# Patient Record
Sex: Female | Born: 1975
Health system: Southern US, Community
[De-identification: ages and names within clinical notes are randomized; demographics above are authoritative.]

## PROBLEM LIST (undated history)

## (undated) DIAGNOSIS — F1910 Other psychoactive substance abuse, uncomplicated: Secondary | ICD-10-CM

## (undated) DIAGNOSIS — D649 Anemia, unspecified: Secondary | ICD-10-CM

## (undated) DIAGNOSIS — I639 Cerebral infarction, unspecified: Secondary | ICD-10-CM

## (undated) DIAGNOSIS — I10 Essential (primary) hypertension: Secondary | ICD-10-CM

## (undated) DIAGNOSIS — M1990 Unspecified osteoarthritis, unspecified site: Secondary | ICD-10-CM

## (undated) DIAGNOSIS — O1490 Unspecified pre-eclampsia, unspecified trimester: Secondary | ICD-10-CM

## (undated) DIAGNOSIS — E669 Obesity, unspecified: Secondary | ICD-10-CM

## (undated) DIAGNOSIS — F1210 Cannabis abuse, uncomplicated: Secondary | ICD-10-CM

## (undated) DIAGNOSIS — E785 Hyperlipidemia, unspecified: Secondary | ICD-10-CM

## (undated) DIAGNOSIS — K219 Gastro-esophageal reflux disease without esophagitis: Secondary | ICD-10-CM

## (undated) DIAGNOSIS — F1410 Cocaine abuse, uncomplicated: Secondary | ICD-10-CM

## (undated) HISTORY — PX: WRIST SURGERY: SHX841

## (undated) HISTORY — DX: Unspecified pre-eclampsia, unspecified trimester: O14.90

## (undated) HISTORY — DX: Essential (primary) hypertension: I10

## (undated) HISTORY — PX: ABDOMINAL HYSTERECTOMY: SHX81

## (undated) HISTORY — PX: TUBAL LIGATION: SHX77

---

## 2012-09-17 ENCOUNTER — Emergency Department (HOSPITAL_COMMUNITY)
Admission: EM | Admit: 2012-09-17 | Discharge: 2012-09-17 | Disposition: A | Discharge status: Home / Self Care | Payer: Medicaid Other | Attending: Emergency Medicine | Admitting: Emergency Medicine

## 2012-09-17 ENCOUNTER — Encounter (HOSPITAL_COMMUNITY): Payer: Self-pay | Admitting: Nurse Practitioner

## 2012-09-17 DIAGNOSIS — R1310 Dysphagia, unspecified: Secondary | ICD-10-CM | POA: Insufficient documentation

## 2012-09-17 DIAGNOSIS — F17200 Nicotine dependence, unspecified, uncomplicated: Secondary | ICD-10-CM | POA: Insufficient documentation

## 2012-09-17 DIAGNOSIS — J020 Streptococcal pharyngitis: Secondary | ICD-10-CM | POA: Insufficient documentation

## 2012-09-17 DIAGNOSIS — R599 Enlarged lymph nodes, unspecified: Secondary | ICD-10-CM | POA: Insufficient documentation

## 2012-09-17 LAB — RAPID STREP SCREEN (MED CTR MEBANE ONLY): Streptococcus, Group A Screen (Direct): POSITIVE — AB

## 2012-09-17 MED ORDER — AMOXICILLIN 500 MG PO CAPS: 500.0000 mg | ORAL_CAPSULE | Freq: Three times a day (TID) | ORAL | Status: DC

## 2012-09-17 MED ORDER — PREDNISONE 20 MG PO TABS
60.0000 mg | ORAL_TABLET | Freq: Once | ORAL | Status: AC
  Administered 2012-09-17: 60 mg via ORAL
  Filled 2012-09-17: qty 3

## 2012-09-17 NOTE — ED Provider Notes (Signed)
History     CSN: [id number]  Arrival date & time 09/17/12  1335   First MD Initiated Contact with Patient 09/17/12 1421      Chief Complaint  Patient presents with  . Sore Throat    (Consider location/radiation/quality/duration/timing/severity/associated sxs/prior treatment) HPI Comments: 37 y.o. Female presents complaining of sore throat that started Sunday and got progressively worse. Pt states pain is 10/10. Painful to swallow. Denies fever, cough, nausea, vomiting, headache, dizziness, sick contacts, chest pain, shortness of breath.     Patient is a 37 y.o. female presenting with pharyngitis.  Sore Throat Associated symptoms include a sore throat. Pertinent negatives include no abdominal pain, chest pain, diaphoresis, fever, headaches, nausea, neck pain, numbness, rash, vomiting or weakness.    History reviewed. No pertinent past medical history.  Past Surgical History  Procedure Laterality Date  . Abdominal hysterectomy      History reviewed. No pertinent family history.  History  Substance Use Topics  . Smoking status: Current Every Day Smoker  . Smokeless tobacco: Not on file  . Alcohol Use: Yes    OB History   Grav Para Term Preterm Abortions TAB SAB Ect Mult Living                  Review of Systems  Constitutional: Negative for fever and diaphoresis.  HENT: Positive for sore throat and trouble swallowing. Negative for facial swelling, rhinorrhea, neck pain, neck stiffness, dental problem and sinus pressure.        Swollen glands  Eyes: Negative for visual disturbance.  Respiratory: Negative for apnea, chest tightness and shortness of breath.   Cardiovascular: Negative for chest pain and palpitations.  Gastrointestinal: Negative for nausea, vomiting, abdominal pain, diarrhea and constipation.  Genitourinary: Negative for dysuria.  Musculoskeletal: Negative for gait problem.  Skin: Negative for rash.  Neurological: Negative for dizziness, weakness,  light-headedness, numbness and headaches.    Allergies  Review of patient's allergies indicates no known allergies.  Home Medications   Current Outpatient Rx  Name  Route  Sig  Dispense  Refill  . ibuprofen (ADVIL,MOTRIN) 200 MG tablet   Oral   Take 400 mg by mouth every 6 (six) hours as needed for pain.           BP 144/82  Pulse 86  Temp(Src) 98 F (36.7 C) (Oral)  Resp 15  SpO2 98%  Physical Exam  Nursing note and vitals reviewed. Constitutional: She is oriented to person, place, and time. She appears well-developed and well-nourished. No distress.  HENT:  Head: Normocephalic and atraumatic. No trismus in the jaw.  Mouth/Throat: No edematous. Oropharyngeal exudate, posterior oropharyngeal edema and posterior oropharyngeal erythema present. No tonsillar abscesses.  Eyes: Conjunctivae and EOM are normal. Pupils are equal, round, and reactive to light. Right eye exhibits no discharge. Left eye exhibits no discharge.  Neck: Normal range of motion. Neck supple.  No meningeal signs  Cardiovascular: Normal rate, regular rhythm and normal heart sounds.  Exam reveals no gallop and no friction rub.   No murmur heard. Pulmonary/Chest: Effort normal and breath sounds normal. No respiratory distress. She has no wheezes. She has no rales. She exhibits no tenderness.  Abdominal: Soft. Bowel sounds are normal. She exhibits no distension. There is no tenderness. There is no rebound and no guarding.  Musculoskeletal: Normal range of motion. She exhibits no edema and no tenderness.  Lymphadenopathy:    She has cervical adenopathy.  Neurological: She is alert and oriented to person,  place, and time. No cranial nerve deficit.  Skin: Skin is warm and dry. She is not diaphoretic. No erythema.    ED Course  Procedures (including critical care time)  Labs Reviewed  RAPID STREP SCREEN - Abnormal; Notable for the following:    Streptococcus, Group A Screen (Direct) POSITIVE (*)    All  other components within normal limits   No results found.   Diagnosis: strep throat    MDM  Pt afebrile with tonsillar exudate, cervical lymphadenopathy, & dysphagia; Rapid strep positive. Treated in the ED with steroids. Presentation non concerning for PTA or infxn spread to soft tissue. No trismus or uvula deviation. Specific return precautions discussed. Pt able to drink water in ED without difficulty with intact air way. Will prescribe amoxicillin and recommended PCP follow up as needed.    Glade Nurse, PA-C 09/17/12 1626

## 2012-09-17 NOTE — ED Provider Notes (Signed)
Medical screening examination/treatment/procedure(s) were performed by non-physician practitioner and as supervising physician I was immediately available for consultation/collaboration.   Carleene Cooper III, MD 09/17/12 [phone]

## 2012-09-17 NOTE — ED Notes (Signed)
C/o sore throat since sunday 

## 2013-07-21 ENCOUNTER — Encounter (HOSPITAL_COMMUNITY): Payer: Self-pay | Admitting: Emergency Medicine

## 2013-07-21 ENCOUNTER — Emergency Department (HOSPITAL_COMMUNITY)
Admission: EM | Admit: 2013-07-21 | Discharge: 2013-07-21 | Disposition: A | Discharge status: Home / Self Care | Payer: Medicaid Other | Attending: Emergency Medicine | Admitting: Emergency Medicine

## 2013-07-21 DIAGNOSIS — E669 Obesity, unspecified: Secondary | ICD-10-CM | POA: Insufficient documentation

## 2013-07-21 DIAGNOSIS — M79609 Pain in unspecified limb: Secondary | ICD-10-CM

## 2013-07-21 DIAGNOSIS — F17200 Nicotine dependence, unspecified, uncomplicated: Secondary | ICD-10-CM | POA: Insufficient documentation

## 2013-07-21 DIAGNOSIS — I809 Phlebitis and thrombophlebitis of unspecified site: Secondary | ICD-10-CM

## 2013-07-21 MED ORDER — OXYCODONE-ACETAMINOPHEN 5-325 MG PO TABS: 1.0000 | ORAL_TABLET | Freq: Four times a day (QID) | ORAL | Status: DC | PRN

## 2013-07-21 NOTE — ED Provider Notes (Addendum)
CSN: [id number]     Arrival date & time 07/21/13  1053 History   First MD Initiated Contact with Patient 07/21/13 1424     Chief Complaint  Patient presents with  . Leg Pain   (Consider location/radiation/quality/duration/timing/severity/associated sxs/prior Treatment) Patient is a 38 y.o. female presenting with leg pain. The history is provided by the patient.  Leg Pain Associated symptoms: no back pain    patient has had pain in her right calf. She states that she has chronic cramps in her legs. She's been on Flexeril for it but has been off it recently. She states she did have a large cramp in her leg and since then she's had some pain there. It is worse with walking. No other trauma. No chest pain or trouble breathing. She is a smoker. She states family members have had blood clots. No rash. No fevers.  No past medical history on file. Past Surgical History  Procedure Laterality Date  . Abdominal hysterectomy     No family history on file. History  Substance Use Topics  . Smoking status: Current Every Day Smoker -- 0.50 packs/day    Types: Cigarettes  . Smokeless tobacco: Not on file  . Alcohol Use: No   OB History   Grav Para Term Preterm Abortions TAB SAB Ect Mult Living                 Review of Systems  Constitutional: Negative for activity change and appetite change.  Eyes: Negative for pain.  Respiratory: Negative for chest tightness and shortness of breath.   Cardiovascular: Negative for chest pain and leg swelling.  Gastrointestinal: Negative for nausea, vomiting, abdominal pain and diarrhea.  Genitourinary: Negative for flank pain.  Musculoskeletal: Negative for back pain and neck stiffness.  Skin: Negative for rash.  Neurological: Negative for weakness, numbness and headaches.  Psychiatric/Behavioral: Negative for behavioral problems.    Allergies  Review of patient's allergies indicates no known allergies.  Home Medications   Current Outpatient Rx   Name  Route  Sig  Dispense  Refill  . OVER THE COUNTER MEDICATION   Oral   Take 2 tablets by mouth every 4 (four) hours as needed (pain). Off brand Tylenol         . oxyCODONE-acetaminophen (PERCOCET/ROXICET) 5-325 MG per tablet   Oral   Take 1-2 tablets by mouth every 6 (six) hours as needed for severe pain.   10 tablet   0    BP 138/92  Pulse 65  Temp(Src) 98.1 F (36.7 C) (Oral)  Resp 20  SpO2 100% Physical Exam  Constitutional: She is oriented to person, place, and time. She appears well-developed.  Patient is obese  HENT:  Head: Normocephalic.  Eyes: Pupils are equal, round, and reactive to light.  Cardiovascular: Normal rate and regular rhythm.   Pulmonary/Chest: Effort normal and breath sounds normal.  Abdominal: Soft.  Musculoskeletal:  Mild bilateral lower extremity pitting edema. There is tenderness to the right calf medially. No rash. Neurovascular intact over her foot.  Neurological: She is alert and oriented to person, place, and time.  Skin: Skin is warm.    ED Course  Procedures (including critical care time) Labs Review Labs Reviewed - No data to display Imaging Review No results found.  EKG Interpretation   None       MDM   1. Phlebitis    Patient with pain in her right calf. Likely due to muscle cramp, however with patient smoking and  family history we will get Dopplers.    Jasper Riling. Alvino Chapel, MD 07/21/13 1552  Doppler shows thickening without clot. Will discharge home.  Jasper Riling. Alvino Chapel, MD 07/21/13 1606

## 2013-07-21 NOTE — ED Notes (Signed)
Transported to vascular lab

## 2013-07-21 NOTE — Progress Notes (Signed)
VASCULAR LAB PRELIMINARY  PRELIMINARY  PRELIMINARY  PRELIMINARY  Right lower extremity venous duplex completed.    Preliminary report:  Right:  No evidence of DVT, superficial thrombosis, or Baker's cyst.  Thickened walls noted in the GSV in the mid calf at the area of most pain.    Keleigh Kazee, RVT 07/21/2013, 3:47 PM

## 2013-07-21 NOTE — Discharge Instructions (Signed)

## 2013-07-21 NOTE — ED Notes (Signed)
Denies injury. No edema. No c/o SOB. States pain worse with ambulating

## 2013-07-21 NOTE — ED Notes (Signed)
Pt reports for the last week and a half her right calf has been hurting. Pt walking with a limp. Pt alert, VSS.

## 2013-10-21 ENCOUNTER — Encounter (HOSPITAL_COMMUNITY): Payer: Self-pay | Admitting: Emergency Medicine

## 2013-10-21 ENCOUNTER — Emergency Department (HOSPITAL_COMMUNITY)
Admission: EM | Admit: 2013-10-21 | Discharge: 2013-10-21 | Discharge status: Against Medical Advice | Payer: Medicaid Other | Attending: Emergency Medicine | Admitting: Emergency Medicine

## 2013-10-21 DIAGNOSIS — F17200 Nicotine dependence, unspecified, uncomplicated: Secondary | ICD-10-CM | POA: Insufficient documentation

## 2013-10-21 DIAGNOSIS — N949 Unspecified condition associated with female genital organs and menstrual cycle: Secondary | ICD-10-CM | POA: Insufficient documentation

## 2013-10-21 DIAGNOSIS — Z90710 Acquired absence of both cervix and uterus: Secondary | ICD-10-CM | POA: Insufficient documentation

## 2013-10-21 NOTE — ED Notes (Signed)
Called pt x 1 in lobby with no answer 

## 2013-10-21 NOTE — ED Notes (Signed)
Called pt multiple times, not in lobby

## 2013-10-21 NOTE — ED Notes (Signed)
Pt reports hx of hysterectomy in 2013, now having pelvic pain/lower abd pain and pressure. Has the discomfort when urinating. Denies any n/v/d. No acute distress noted at triage.

## 2013-11-04 ENCOUNTER — Emergency Department (HOSPITAL_COMMUNITY)
Admission: EM | Admit: 2013-11-04 | Discharge: 2013-11-04 | Disposition: A | Discharge status: Home / Self Care | Payer: Medicaid Other | Attending: Emergency Medicine | Admitting: Emergency Medicine

## 2013-11-04 ENCOUNTER — Encounter (HOSPITAL_COMMUNITY): Payer: Self-pay | Admitting: Emergency Medicine

## 2013-11-04 DIAGNOSIS — IMO0002 Reserved for concepts with insufficient information to code with codable children: Secondary | ICD-10-CM | POA: Insufficient documentation

## 2013-11-04 DIAGNOSIS — L02412 Cutaneous abscess of left axilla: Secondary | ICD-10-CM

## 2013-11-04 DIAGNOSIS — F17200 Nicotine dependence, unspecified, uncomplicated: Secondary | ICD-10-CM | POA: Insufficient documentation

## 2013-11-04 NOTE — ED Notes (Signed)
Pt c/o left axilla abscess x week. Pt believes it may have drained last night but not for sure.

## 2013-11-04 NOTE — Discharge Instructions (Signed)
Abscess An abscess is an infected area that contains a collection of pus and debris.It can occur in almost any part of the body. An abscess is also known as a furuncle or boil. CAUSES  An abscess occurs when tissue gets infected. This can occur from blockage of oil or sweat glands, infection of hair follicles, or a minor injury to the skin. As the body tries to fight the infection, pus collects in the area and creates pressure under the skin. This pressure causes pain. People with weakened immune systems have difficulty fighting infections and get certain abscesses more often.  SYMPTOMS Usually an abscess develops on the skin and becomes a painful mass that is red, warm, and tender. If the abscess forms under the skin, you may feel a moveable soft area under the skin. Some abscesses break open (rupture) on their own, but most will continue to get worse without care. The infection can spread deeper into the body and eventually into the bloodstream, causing you to feel ill.  DIAGNOSIS  Your caregiver will take your medical history and perform a physical exam. A sample of fluid may also be taken from the abscess to determine what is causing your infection. TREATMENT  Your caregiver may prescribe antibiotic medicines to fight the infection. However, taking antibiotics alone usually does not cure an abscess. Your caregiver may need to make a small cut (incision) in the abscess to drain the pus. In some cases, gauze is packed into the abscess to reduce pain and to continue draining the area. HOME CARE INSTRUCTIONS   Only take over-the-counter or prescription medicines for pain, discomfort, or fever as directed by your caregiver.  If you were prescribed antibiotics, take them as directed. Finish them even if you start to feel better.  If gauze is used, follow your caregiver's directions for changing the gauze.  To avoid spreading the infection:  Keep your draining abscess covered with a  bandage.  Wash your hands well.  Do not share personal care items, towels, or whirlpools with others.  Avoid skin contact with others.  Keep your skin and clothes clean around the abscess.  Keep all follow-up appointments as directed by your caregiver. SEEK MEDICAL CARE IF:   You have increased pain, swelling, redness, fluid drainage, or bleeding.  You have muscle aches, chills, or a general ill feeling.  You have a fever. MAKE SURE YOU:   Understand these instructions.  Will watch your condition.  Will get help right away if you are not doing well or get worse. Document Released: 04/05/2005 Document Revised: 12/26/2011 Document Reviewed: 09/08/2011 [employer] Patient Information 2014 [employer].  

## 2013-11-16 NOTE — ED Provider Notes (Signed)
CSN: [id number]     Arrival date & time 11/04/13  1518 History   First MD Initiated Contact with Patient 11/04/13 1648     Chief Complaint  Patient presents with  . Recurrent Skin Infections     (Consider location/radiation/quality/duration/timing/severity/associated sxs/prior Treatment) HPI  38 year old female with a painful lesion in her left axilla. First noticed about a week ago. Progressively getting larger and more painful. Small amount of drainage from the site last night. No fevers or chills. No history of diabetes or other immunocompromising state.  No past medical history on file. Past Surgical History  Procedure Laterality Date  . Abdominal hysterectomy     No family history on file. History  Substance Use Topics  . Smoking status: Current Every Day Smoker -- 0.50 packs/day    Types: Cigarettes  . Smokeless tobacco: Not on file  . Alcohol Use: No   OB History   Grav Para Term Preterm Abortions TAB SAB Ect Mult Living                 Review of Systems  All systems reviewed and negative, other than as noted in HPI.   Allergies  Review of patient's allergies indicates no known allergies.  Home Medications   Prior to Admission medications   Medication Sig Start Date End Date Taking? Authorizing Provider  acetaminophen (TYLENOL) 500 MG tablet Take 1,000 mg by mouth every 6 (six) hours as needed for moderate pain.   Yes Historical Provider, MD   BP 151/97  Pulse 69  Temp(Src) 97.8 F (36.6 C) (Oral)  Resp 18  SpO2 100% Physical Exam  Nursing note and vitals reviewed. Constitutional: She appears well-developed and well-nourished. No distress.  HENT:  Head: Normocephalic and atraumatic.  Eyes: Conjunctivae are normal. Right eye exhibits no discharge. Left eye exhibits no discharge.  Neck: Neck supple.  Cardiovascular: Normal rate, regular rhythm and normal heart sounds.  Exam reveals no gallop and no friction rub.   No murmur heard. Pulmonary/Chest:  Effort normal and breath sounds normal. No respiratory distress.  Abdominal: Soft. She exhibits no distension. There is no tenderness.  Musculoskeletal: She exhibits no edema and no tenderness.  Neurological: She is alert.  Skin: Skin is warm and dry.  Small abscess to the left axilla. No surrounding cellulitis. No spontaneous drainage.  Psychiatric: She has a normal mood and affect. Her behavior is normal. Thought content normal.    ED Course  Procedures (including critical care time) Labs Review Labs Reviewed - No data to display  Imaging Review No results found.   EKG Interpretation None      MDM   Final diagnoses:  Abscess of left axilla    56 female with a small abscess in left axilla. No surrounding cellulitis. Incised and drained. Continued wound care. Return precautions were discussed.    Virgel Manifold, MD 11/16/13 [phone]

## 2013-11-19 ENCOUNTER — Emergency Department (HOSPITAL_COMMUNITY)
Admission: EM | Admit: 2013-11-19 | Discharge: 2013-11-19 | Disposition: A | Discharge status: Home / Self Care | Payer: Medicaid Other | Attending: Emergency Medicine | Admitting: Emergency Medicine

## 2013-11-19 ENCOUNTER — Encounter (HOSPITAL_COMMUNITY): Payer: Self-pay | Admitting: Emergency Medicine

## 2013-11-19 DIAGNOSIS — Z90710 Acquired absence of both cervix and uterus: Secondary | ICD-10-CM | POA: Insufficient documentation

## 2013-11-19 DIAGNOSIS — E669 Obesity, unspecified: Secondary | ICD-10-CM | POA: Insufficient documentation

## 2013-11-19 DIAGNOSIS — Z3202 Encounter for pregnancy test, result negative: Secondary | ICD-10-CM | POA: Insufficient documentation

## 2013-11-19 DIAGNOSIS — F17200 Nicotine dependence, unspecified, uncomplicated: Secondary | ICD-10-CM | POA: Insufficient documentation

## 2013-11-19 DIAGNOSIS — N390 Urinary tract infection, site not specified: Secondary | ICD-10-CM | POA: Insufficient documentation

## 2013-11-19 LAB — CBC WITH DIFFERENTIAL/PLATELET
Basophils Absolute: 0 10*3/uL (ref 0.0–0.1)
Basophils Relative: 0 % (ref 0–1)
Eosinophils Absolute: 0.3 10*3/uL (ref 0.0–0.7)
Eosinophils Relative: 5 % (ref 0–5)
HCT: 34.3 % — ABNORMAL LOW (ref 36.0–46.0)
Hemoglobin: 11.6 g/dL — ABNORMAL LOW (ref 12.0–15.0)
Lymphocytes Relative: 20 % (ref 12–46)
Lymphs Abs: 1.3 10*3/uL (ref 0.7–4.0)
MCH: 31 pg (ref 26.0–34.0)
MCHC: 33.8 g/dL (ref 30.0–36.0)
MCV: 91.7 fL (ref 78.0–100.0)
Monocytes Absolute: 0.8 10*3/uL (ref 0.1–1.0)
Monocytes Relative: 11 % (ref 3–12)
Neutro Abs: 4.2 10*3/uL (ref 1.7–7.7)
Neutrophils Relative %: 63 % (ref 43–77)
Platelets: 231 10*3/uL (ref 150–400)
RBC: 3.74 MIL/uL — ABNORMAL LOW (ref 3.87–5.11)
RDW: 13.5 % (ref 11.5–15.5)
WBC: 6.6 10*3/uL (ref 4.0–10.5)

## 2013-11-19 LAB — COMPREHENSIVE METABOLIC PANEL
ALT: 24 U/L (ref 0–35)
AST: 21 U/L (ref 0–37)
Albumin: 2.8 g/dL — ABNORMAL LOW (ref 3.5–5.2)
Alkaline Phosphatase: 60 U/L (ref 39–117)
BUN: 10 mg/dL (ref 6–23)
CO2: 23 mEq/L (ref 19–32)
Calcium: 9 mg/dL (ref 8.4–10.5)
Chloride: 105 mEq/L (ref 96–112)
Creatinine, Ser: 1.06 mg/dL (ref 0.50–1.10)
GFR calc Af Amer: 77 mL/min — ABNORMAL LOW (ref >90)
GFR calc non Af Amer: 67 mL/min — ABNORMAL LOW (ref >90)
Glucose, Bld: 103 mg/dL — ABNORMAL HIGH (ref 70–99)
Potassium: 4.2 mEq/L (ref 3.7–5.3)
Sodium: 141 mEq/L (ref 137–147)
Total Bilirubin: 0.3 mg/dL (ref 0.3–1.2)
Total Protein: 6.5 g/dL (ref 6.0–8.3)

## 2013-11-19 LAB — URINALYSIS, ROUTINE W REFLEX MICROSCOPIC
Bilirubin Urine: NEGATIVE
Glucose, UA: NEGATIVE mg/dL
Ketones, ur: NEGATIVE mg/dL
Nitrite: NEGATIVE
Protein, ur: 100 mg/dL — AB
Specific Gravity, Urine: 1.015 (ref 1.005–1.030)
Urobilinogen, UA: 1 mg/dL (ref 0.0–1.0)
pH: 6 (ref 5.0–8.0)

## 2013-11-19 LAB — URINE MICROSCOPIC-ADD ON

## 2013-11-19 LAB — POC URINE PREG, ED: Preg Test, Ur: NEGATIVE

## 2013-11-19 MED ORDER — DEXTROSE 5 % IV SOLN
1.0000 g | Freq: Once | INTRAVENOUS | Status: AC
  Administered 2013-11-19: 1 g via INTRAVENOUS
  Filled 2013-11-19: qty 10

## 2013-11-19 MED ORDER — ONDANSETRON HCL 4 MG/2ML IJ SOLN
4.0000 mg | Freq: Once | INTRAMUSCULAR | Status: AC
  Administered 2013-11-19: 4 mg via INTRAVENOUS
  Filled 2013-11-19: qty 2

## 2013-11-19 MED ORDER — CEPHALEXIN 500 MG PO CAPS: 500.0000 mg | ORAL_CAPSULE | Freq: Two times a day (BID) | ORAL | Status: DC

## 2013-11-19 MED ORDER — HYDROCODONE-ACETAMINOPHEN 5-325 MG PO TABS: 1.0000 | ORAL_TABLET | Freq: Four times a day (QID) | ORAL | Status: DC | PRN

## 2013-11-19 MED ORDER — MORPHINE SULFATE 4 MG/ML IJ SOLN
4.0000 mg | Freq: Once | INTRAMUSCULAR | Status: AC
  Administered 2013-11-19: 4 mg via INTRAVENOUS
  Filled 2013-11-19: qty 1

## 2013-11-19 MED ORDER — SODIUM CHLORIDE 0.9 % IV SOLN
Freq: Once | INTRAVENOUS | Status: AC
  Administered 2013-11-19: 12:00:00 via INTRAVENOUS

## 2013-11-19 NOTE — ED Provider Notes (Signed)
CSN: [id number]     Arrival date & time 11/19/13  1020 History   First MD Initiated Contact with Patient 11/19/13 1035     Chief Complaint  Patient presents with  . Flank Pain     (Consider location/radiation/quality/duration/timing/severity/associated sxs/prior Treatment) HPI  This is a 38 year old female who presents with bilateral flank pain, chills, and urinary frequency. Patient reports onset of symptoms on Friday. She reports equal and bilateral flank pain that radiates into her bilateral lower quadrants. Currently her pain is 10 out of 10. Nothing makes it better or worse. She denies any dysuria or hematuria but does endorse urinary frequency. Patient states "I think I have a kidney infection." She reported past similar symptoms. No known history of kidney stones. Patient had abdominal hysterectomy in 2013. She denies any other symptoms including chest pain, shortness breath, nausea, vomiting, or diarrhea.  History reviewed. No pertinent past medical history. Past Surgical History  Procedure Laterality Date  . Abdominal hysterectomy     History reviewed. No pertinent family history. History  Substance Use Topics  . Smoking status: Current Every Day Smoker -- 0.50 packs/day    Types: Cigarettes  . Smokeless tobacco: Not on file  . Alcohol Use: No   OB History   Grav Para Term Preterm Abortions TAB SAB Ect Mult Living                 Review of Systems  Constitutional: Positive for chills. Negative for fever.  Respiratory: Negative for chest tightness and shortness of breath.   Cardiovascular: Negative for chest pain.  Gastrointestinal: Negative for nausea, vomiting and abdominal pain.  Genitourinary: Positive for urgency, frequency and flank pain. Negative for dysuria and hematuria.  Musculoskeletal: Positive for back pain.  Neurological: Negative for headaches.  All other systems reviewed and are negative.     Allergies  No known allergies  Home Medications    Prior to Admission medications   Medication Sig Start Date End Date Taking? Authorizing Provider  acetaminophen (TYLENOL) 500 MG tablet Take 1,000 mg by mouth every 6 (six) hours as needed for moderate pain.    Historical Provider, MD   BP 151/92  Pulse 85  Temp(Src) 98.1 F (36.7 C) (Oral)  Resp 16  SpO2 98% Physical Exam  Nursing note and vitals reviewed. Constitutional: She is oriented to person, place, and time. She appears well-developed and well-nourished.  Obese  HENT:  Head: Normocephalic and atraumatic.  Cardiovascular: Normal rate, regular rhythm and normal heart sounds.   No murmur heard. Pulmonary/Chest: Effort normal. No respiratory distress. She has no wheezes.  Abdominal: Soft. Bowel sounds are normal. She exhibits no distension. There is no tenderness. There is no rebound and no guarding.  No CVA tenderness noted  Neurological: She is alert and oriented to person, place, and time.  Skin: Skin is warm and dry.  Psychiatric: She has a normal mood and affect.    ED Course  Procedures (including critical care time) Labs Review Labs Reviewed  URINALYSIS, ROUTINE W REFLEX MICROSCOPIC - Abnormal; Notable for the following:    APPearance TURBID (*)    Hgb urine dipstick MODERATE (*)    Protein, ur 100 (*)    Leukocytes, UA LARGE (*)    All other components within normal limits  CBC WITH DIFFERENTIAL - Abnormal; Notable for the following:    RBC 3.74 (*)    Hemoglobin 11.6 (*)    HCT 34.3 (*)    All other components  within normal limits  COMPREHENSIVE METABOLIC PANEL - Abnormal; Notable for the following:    Glucose, Bld 103 (*)    Albumin 2.8 (*)    GFR calc non Af Amer 67 (*)    GFR calc Af Amer 77 (*)    All other components within normal limits  URINE CULTURE  URINE MICROSCOPIC-ADD ON  POC URINE PREG, ED    Imaging Review No results found.   EKG Interpretation None      MDM   Final diagnoses:  Urinary tract infection    Patient presents  with bilateral flank pain and frequency of urination. She is nontoxic and afebrile on exam.  No CVA or abdominal tenderness noted. Basic labwork was obtained.  Patient was given pain medication. Lab work notable for urinary tract infection. Patient was given Rocephin. While in patient is afebrile and has no evidence of leukocytosis, given flank pain will place on a longer seven-day course of antibiotics for urinary tract infection. Patient to followup with primary care physician.  After history, exam, and medical workup I feel the patient has been appropriately medically screened and is safe for discharge home. Pertinent diagnoses were discussed with the patient. Patient was given return precautions.     Merryl Hacker, MD 11/19/13 1322

## 2013-11-19 NOTE — Discharge Instructions (Signed)
Urinary Tract Infection  Urinary tract infections (UTIs) can develop anywhere along your urinary tract. Your urinary tract is your body's drainage system for removing wastes and extra water. Your urinary tract includes two kidneys, two ureters, a bladder, and a urethra. Your kidneys are a pair of bean-shaped organs. Each kidney is about the size of your fist. They are located below your ribs, one on each side of your spine.  CAUSES  Infections are caused by microbes, which are microscopic organisms, including fungi, viruses, and bacteria. These organisms are so small that they can only be seen through a microscope. Bacteria are the microbes that most commonly cause UTIs.  SYMPTOMS   Symptoms of UTIs may vary by age and gender of the patient and by the location of the infection. Symptoms in young women typically include a frequent and intense urge to urinate and a painful, burning feeling in the bladder or urethra during urination. Older women and men are more likely to be tired, shaky, and weak and have muscle aches and abdominal pain. A fever may mean the infection is in your kidneys. Other symptoms of a kidney infection include pain in your back or sides below the ribs, nausea, and vomiting.  DIAGNOSIS  To diagnose a UTI, your caregiver will ask you about your symptoms. Your caregiver also will ask to provide a urine sample. The urine sample will be tested for bacteria and white blood cells. White blood cells are made by your body to help fight infection.  TREATMENT   Typically, UTIs can be treated with medication. Because most UTIs are caused by a bacterial infection, they usually can be treated with the use of antibiotics. The choice of antibiotic and length of treatment depend on your symptoms and the type of bacteria causing your infection.  HOME CARE INSTRUCTIONS   If you were prescribed antibiotics, take them exactly as your caregiver instructs you. Finish the medication even if you feel better after you  have only taken some of the medication.   Drink enough water and fluids to keep your urine clear or pale yellow.   Avoid caffeine, tea, and carbonated beverages. They tend to irritate your bladder.   Empty your bladder often. Avoid holding urine for long periods of time.   Empty your bladder before and after sexual intercourse.   After a bowel movement, women should cleanse from front to back. Use each tissue only once.  SEEK MEDICAL CARE IF:    You have back pain.   You develop a fever.   Your symptoms do not begin to resolve within 3 days.  SEEK IMMEDIATE MEDICAL CARE IF:    You have severe back pain or lower abdominal pain.   You develop chills.   You have nausea or vomiting.   You have continued burning or discomfort with urination.  MAKE SURE YOU:    Understand these instructions.   Will watch your condition.   Will get help right away if you are not doing well or get worse.  Document Released: 04/05/2005 Document Revised: 12/26/2011 Document Reviewed: 08/04/2011  [employer] Patient Information 2014 [employer].

## 2013-11-19 NOTE — ED Notes (Signed)
Pt c/o chills and bilateral flank pain since Friday.  Frequency of urination also.

## 2013-11-19 NOTE — ED Notes (Signed)
md at bedside  Pt alert and oriented x4. Respirations even and unlabored, bilateral symmetrical rise and fall of chest. Skin warm and dry. In no acute distress. Denies needs.   

## 2013-11-21 LAB — URINE CULTURE: Colony Count: >=100000

## 2013-11-21 NOTE — ED Notes (Signed)
Post ED Visit - Positive Culture Follow-up  Culture report reviewed by antimicrobial stewardship pharmacist: []  Wes Dulaney, Pharm.D., BCPS [x]  Heide Guile, Pharm.D., BCPS syang []  Alycia Rossetti, Pharm.D., BCPS []  [facility], Pharm.D., BCPS, AAHIVP []  Legrand Como, Pharm.D., BCPS, AAHIVP []  Juliene Pina, Pharm.D.  Positive urine culture Treated with cephalexin, organism sensitive to the same and no further patient follow-up is required at this time.  Ileene Musa 11/21/2013, 2:09 PM

## 2013-11-22 ENCOUNTER — Telehealth (HOSPITAL_BASED_OUTPATIENT_CLINIC_OR_DEPARTMENT_OTHER): Payer: Self-pay | Admitting: Emergency Medicine

## 2013-11-22 NOTE — Telephone Encounter (Signed)
Post ED Visit - Positive Culture Follow-up  Culture report reviewed by antimicrobial stewardship pharmacist: []  Wes Fouke, Pharm.D., BCPS []  Heide Guile, Pharm.D., BCPS []  Alycia Rossetti, Pharm.D., BCPS []  [place], Pharm.D., BCPS, AAHIVP []  Legrand Como, Pharm.D., BCPS, AAHIVP []  Juliene Pina, Pharm.D. [x]  Salome Arnt, Pharm.D  Positive urine culture Treated with Keflex, organism sensitive to the same and no further patient follow-up is required at this time.  Jalaina Salyers 11/22/2013, 4:09 PM

## 2014-02-21 ENCOUNTER — Emergency Department (HOSPITAL_COMMUNITY)
Admission: EM | Admit: 2014-02-21 | Discharge: 2014-02-21 | Disposition: A | Discharge status: Home / Self Care | Payer: Medicaid Other | Attending: Emergency Medicine | Admitting: Emergency Medicine

## 2014-02-21 ENCOUNTER — Encounter (HOSPITAL_COMMUNITY): Payer: Self-pay | Admitting: Emergency Medicine

## 2014-02-21 DIAGNOSIS — G44209 Tension-type headache, unspecified, not intractable: Secondary | ICD-10-CM | POA: Insufficient documentation

## 2014-02-21 DIAGNOSIS — F17200 Nicotine dependence, unspecified, uncomplicated: Secondary | ICD-10-CM | POA: Insufficient documentation

## 2014-02-21 DIAGNOSIS — R51 Headache: Secondary | ICD-10-CM | POA: Insufficient documentation

## 2014-02-21 DIAGNOSIS — Z79899 Other long term (current) drug therapy: Secondary | ICD-10-CM | POA: Insufficient documentation

## 2014-02-21 LAB — I-STAT CHEM 8, ED
BUN: 11 mg/dL (ref 6–23)
Calcium, Ion: 1.23 mmol/L (ref 1.12–1.23)
Chloride: 111 mEq/L (ref 96–112)
Creatinine, Ser: 1.1 mg/dL (ref 0.50–1.10)
Glucose, Bld: 88 mg/dL (ref 70–99)
HCT: 39 % (ref 36.0–46.0)
Hemoglobin: 13.3 g/dL (ref 12.0–15.0)
Potassium: 4.3 mEq/L (ref 3.7–5.3)
Sodium: 137 mEq/L (ref 137–147)
TCO2: 23 mmol/L (ref 0–100)

## 2014-02-21 MED ORDER — KETOROLAC TROMETHAMINE 60 MG/2ML IM SOLN
60.0000 mg | Freq: Once | INTRAMUSCULAR | Status: AC
  Administered 2014-02-21: 60 mg via INTRAMUSCULAR
  Filled 2014-02-21: qty 2

## 2014-02-21 MED ORDER — HYDROCODONE-ACETAMINOPHEN 5-325 MG PO TABS: 1.0000 | ORAL_TABLET | ORAL | Status: DC | PRN

## 2014-02-21 NOTE — ED Provider Notes (Signed)
CSN: [id number]     Arrival date & time 02/21/14  9509 History   First MD Initiated Contact with Patient 02/21/14 [phone]     Chief Complaint  Patient presents with  . Headache     (Consider location/radiation/quality/duration/timing/severity/associated sxs/prior Treatment) Patient is a 38 y.o. female presenting with headaches. The history is provided by the patient.  Headache  She complains of headache for one week. She also has a tender spot on the right side of her head. She is concerned that her blood count is low because last time. She had a low blood count, she had a headache. She is not currently taking iron. She's not taking any medications other than Tylenol and ibuprofen. She has tried these for the headache. It has not helped. She denies fever or chills, nausea, vomiting, chest pain, weakness, or dizziness. There are no other known modifying factors  History reviewed. No pertinent past medical history. Past Surgical History  Procedure Laterality Date  . Abdominal hysterectomy    . Tubal ligation     No family history on file. History  Substance Use Topics  . Smoking status: Current Every Day Smoker -- 0.50 packs/day    Types: Cigarettes  . Smokeless tobacco: Not on file  . Alcohol Use: Yes     Comment: once a week   OB History   Grav Para Term Preterm Abortions TAB SAB Ect Mult Living                 Review of Systems  Neurological: Positive for headaches.  All other systems reviewed and are negative.     Allergies  No known allergies  Home Medications   Prior to Admission medications   Medication Sig Start Date End Date Taking? Authorizing Provider  cyclobenzaprine (FLEXERIL) 10 MG tablet Take 10 mg by mouth 3 (three) times daily as needed for muscle spasms.   Yes Historical Provider, MD  ibuprofen (ADVIL,MOTRIN) 200 MG tablet Take 400 mg by mouth every 6 (six) hours as needed for moderate pain.   Yes Historical Provider, MD  HYDROcodone-acetaminophen  (NORCO) 5-325 MG per tablet Take 1 tablet by mouth every 4 (four) hours as needed. 02/21/14   Richarda Blade, MD   BP 133/79  Pulse 78  Temp(Src) 98.3 F (36.8 C)  Resp 18  Ht 5\' 2"  (1.575 m)  Wt 282 lb (127.914 kg)  BMI 51.57 kg/m2  SpO2 100% Physical Exam  Nursing note and vitals reviewed. Constitutional: She is oriented to person, place, and time. She appears well-developed and well-nourished.  HENT:  Head: Normocephalic and atraumatic.  Eyes: Conjunctivae and EOM are normal. Pupils are equal, round, and reactive to light.  Neck: Normal range of motion and phonation normal. Neck supple.  There is no meningismus  Cardiovascular: Normal rate, regular rhythm and intact distal pulses.   Pulmonary/Chest: Effort normal and breath sounds normal. She exhibits no tenderness.  Abdominal: Soft. She exhibits no distension. There is no tenderness. There is no guarding.  Musculoskeletal: Normal range of motion.  Neurological: She is alert and oriented to person, place, and time. She exhibits normal muscle tone.  No dysarthria, ataxia or nystagmus  Skin: Skin is warm and dry.  Psychiatric: She has a normal mood and affect. Her behavior is normal. Judgment and thought content normal.    ED Course  Procedures (including critical care time)  Medications  ketorolac (TORADOL) injection 60 mg (60 mg Intramuscular Given 02/21/14 1056)    Patient Vitals for  the past 24 hrs:  BP Temp Pulse Resp SpO2 Height Weight  02/21/14 1235 133/79 mmHg - 78 18 100 % - -  02/21/14 1200 128/69 mmHg - 70 - 100 % - -  02/21/14 1130 121/60 mmHg - 80 - 100 % - -  02/21/14 1100 120/70 mmHg - 85 - 100 % - -  02/21/14 1015 121/63 mmHg - 101 - 100 % - -  02/21/14 1001 120/66 mmHg 98.3 F (36.8 C) 110 18 100 % 5\' 2"  (1.575 m) 282 lb (127.914 kg)    12:45 PM Reevaluation with update and discussion. After initial assessment and treatment, an updated evaluation reveals she feels better. Findings discussed with pt. All  questions answered.. [place] CHEM 8, ED    Imaging Review No results found.   EKG Interpretation None      MDM   Final diagnoses:  Tension headache    Headache, consistent with source as tension. Doubt intracranial bleeding, meningitis, cervical neuropathy or serious bacterial infection.   Nursing Notes Reviewed/ Care Coordinated Applicable Imaging Reviewed Interpretation of Laboratory Data incorporated into ED treatment  The patient appears reasonably screened and/or stabilized for discharge and I doubt any other medical condition or other [facility] requiring further screening, evaluation, or treatment in the ED at this time prior to discharge.  Plan: Home Medications- Norco; Home Treatments- rest; return here if the recommended treatment, does not improve the symptoms; Recommended follow up- PCP prn   Richarda Blade, MD 02/21/14 1253

## 2014-02-21 NOTE — ED Notes (Signed)
Brought pt to room; pt getting undressed and into a gown at this time; Lanelle Bal, RN aware of pt

## 2014-02-21 NOTE — Discharge Instructions (Signed)

## 2014-02-21 NOTE — ED Notes (Signed)
Pt c/o intermittent sharp HA and tender spot to right head x 1.5 weeks. sts she has taken OTC meds and it does help some. Nad, skin warm and dry, resp e/u.

## 2014-03-05 ENCOUNTER — Emergency Department (HOSPITAL_COMMUNITY)
Admission: EM | Admit: 2014-03-05 | Discharge: 2014-03-06 | Disposition: A | Discharge status: Home / Self Care | Payer: Medicaid Other | Attending: Emergency Medicine | Admitting: Emergency Medicine

## 2014-03-05 ENCOUNTER — Encounter (HOSPITAL_COMMUNITY): Payer: Self-pay | Admitting: Emergency Medicine

## 2014-03-05 DIAGNOSIS — N949 Unspecified condition associated with female genital organs and menstrual cycle: Secondary | ICD-10-CM | POA: Insufficient documentation

## 2014-03-05 DIAGNOSIS — F17200 Nicotine dependence, unspecified, uncomplicated: Secondary | ICD-10-CM | POA: Diagnosis not present

## 2014-03-05 DIAGNOSIS — R6883 Chills (without fever): Secondary | ICD-10-CM | POA: Insufficient documentation

## 2014-03-05 DIAGNOSIS — N83202 Unspecified ovarian cyst, left side: Secondary | ICD-10-CM

## 2014-03-05 DIAGNOSIS — R109 Unspecified abdominal pain: Secondary | ICD-10-CM | POA: Insufficient documentation

## 2014-03-05 DIAGNOSIS — R300 Dysuria: Secondary | ICD-10-CM | POA: Diagnosis not present

## 2014-03-05 DIAGNOSIS — N83209 Unspecified ovarian cyst, unspecified side: Secondary | ICD-10-CM | POA: Diagnosis not present

## 2014-03-05 DIAGNOSIS — IMO0002 Reserved for concepts with insufficient information to code with codable children: Secondary | ICD-10-CM | POA: Diagnosis not present

## 2014-03-05 LAB — URINALYSIS, ROUTINE W REFLEX MICROSCOPIC
Bilirubin Urine: NEGATIVE
Glucose, UA: NEGATIVE mg/dL
Hgb urine dipstick: NEGATIVE
Ketones, ur: NEGATIVE mg/dL
Leukocytes, UA: NEGATIVE
Nitrite: NEGATIVE
Protein, ur: NEGATIVE mg/dL
Specific Gravity, Urine: 1.025 (ref 1.005–1.030)
Urobilinogen, UA: 1 mg/dL (ref 0.0–1.0)
pH: 6 (ref 5.0–8.0)

## 2014-03-05 MED ORDER — IBUPROFEN 200 MG PO TABS
600.0000 mg | ORAL_TABLET | Freq: Once | ORAL | Status: AC
  Administered 2014-03-05: 600 mg via ORAL
  Filled 2014-03-05: qty 3

## 2014-03-05 NOTE — ED Provider Notes (Signed)
CSN: [id number]     Arrival date & time 03/05/14  1850 History   First MD Initiated Contact with Patient 03/05/14 2301     Chief Complaint  Patient presents with  . Headache  . Abdominal Pain    left   (Consider location/radiation/quality/duration/timing/severity/associated sxs/prior Treatment) HPI Lisa Haley is 38 yo female with PMH: hysterectomy, presenting to the ED with abd pain x 1 week.  Pt states pain has been progressively worse in the last 3 days.  She reports the pain as been on her "left side" and hurts when she urinates, walks or has intercourse.  She has taken tylenol at home with minimal improvement and rates as 7-8/10.  She states she has felt episodes of feeling hot and episodes of chills.  She denies nausea, vomiting, diarrhea or constipation.  She also denies vaginal discharge, burning or itching.      History reviewed. No pertinent past medical history. Past Surgical History  Procedure Laterality Date  . Abdominal hysterectomy    . Tubal ligation     No family history on file. History  Substance Use Topics  . Smoking status: Current Every Day Smoker -- 0.25 packs/day    Types: Cigarettes  . Smokeless tobacco: Not on file  . Alcohol Use: Yes     Comment: once a week   OB History   Grav Para Term Preterm Abortions TAB SAB Ect Mult Living                 Review of Systems  Constitutional: Positive for chills. Negative for fever, activity change, appetite change and fatigue.  HENT: Negative for sore throat.   Eyes: Negative for visual disturbance.  Respiratory: Negative for cough and shortness of breath.   Cardiovascular: Negative for chest pain and leg swelling.  Gastrointestinal: Positive for abdominal pain and abdominal distention. Negative for nausea, vomiting, diarrhea, constipation, blood in stool and anal bleeding.  Genitourinary: Positive for dysuria, pelvic pain and dyspareunia. Negative for vaginal discharge and difficulty urinating.    Musculoskeletal: Negative for myalgias.  Skin: Negative for rash.  Neurological: Negative for weakness, numbness and headaches.      Allergies  No known allergies  Home Medications   Prior to Admission medications   Medication Sig Start Date End Date Taking? Authorizing Provider  acetaminophen (TYLENOL) 325 MG tablet Take 650 mg by mouth every 6 (six) hours as needed for headache.   Yes Historical Provider, MD  cyclobenzaprine (FLEXERIL) 10 MG tablet Take 10 mg by mouth 3 (three) times daily as needed for muscle spasms.   Yes Historical Provider, MD   BP 150/94  Pulse 82  Temp(Src) 98.5 F (36.9 C) (Oral)  Resp 18  SpO2 98% Physical Exam  Nursing note and vitals reviewed. Constitutional: She is oriented to person, place, and time. She appears well-developed and well-nourished. No distress.  HENT:  Head: Normocephalic and atraumatic.  Mouth/Throat: Oropharynx is clear and moist. No oropharyngeal exudate.  Eyes: Conjunctivae are normal. No scleral icterus.  Neck: Neck supple. No thyromegaly present.  Cardiovascular: Normal rate, regular rhythm and intact distal pulses.   Pulmonary/Chest: Effort normal and breath sounds normal. No respiratory distress. She has no wheezes. She has no rales. She exhibits no tenderness.  Abdominal: Soft. Normal appearance and bowel sounds are normal. She exhibits no distension and no mass. There is tenderness. There is no rebound, no guarding and no CVA tenderness.    Pt has large body habitus  Genitourinary: Pelvic exam was  performed with patient supine. There is no rash, tenderness, lesion or injury on the right labia. There is no rash, tenderness, lesion or injury on the left labia. Right adnexum displays no tenderness. Left adnexum displays no tenderness. No erythema or bleeding around the vagina. No foreign body around the vagina. No signs of injury around the vagina. Vaginal discharge found.  Cervix not noted, pt has hx of total hysterectomy,  pt has thin white discharge noted in vaginal vault.  Musculoskeletal: She exhibits no tenderness.  Lymphadenopathy:    She has no cervical adenopathy.  Neurological: She is alert and oriented to person, place, and time.  Skin: Skin is warm, dry and intact. No rash noted. She is not diaphoretic.  Psychiatric: She has a normal mood and affect.    ED Course  Procedures (including critical care time) Labs Review Labs Reviewed  WET PREP, GENITAL - Abnormal; Notable for the following:    Clue Cells Wet Prep HPF POC FEW (*)    All other components within normal limits  CBC WITH DIFFERENTIAL - Abnormal; Notable for the following:    Eosinophils Relative 6 (*)    All other components within normal limits  COMPREHENSIVE METABOLIC PANEL - Abnormal; Notable for the following:    Total Bilirubin 0.2 (*)    GFR calc non Af Amer 71 (*)    GFR calc Af Amer 82 (*)    All other components within normal limits  GC/CHLAMYDIA PROBE AMP  URINALYSIS, ROUTINE W REFLEX MICROSCOPIC  LIPASE, BLOOD  RPR  HIV ANTIBODY (ROUTINE TESTING)    Imaging Review US Transvaginal Non-ob  03/06/2014   CLINICAL DATA:  Left-sided abdominal pain. Previous partial hysterectomy.  EXAM: TRANSABDOMINAL AND TRANSVAGINAL ULTRASOUND OF PELVIS  DOPPLER ULTRASOUND OF OVARIES  TECHNIQUE: Both transabdominal and transvaginal ultrasound examinations of the pelvis were performed. Transabdominal technique was performed for global imaging of the pelvis including uterus, ovaries, adnexal regions, and pelvic cul-de-sac.  It was necessary to proceed with endovaginal exam following the transabdominal exam to visualize the ovaries. Color and duplex Doppler ultrasound was utilized to evaluate blood flow to the ovaries.  COMPARISON:  None.  FINDINGS: Uterus  Uterus is surgically absent.  Endometrium  Surgically absent.  Right ovary  Right ovary is not identified. No adnexal masses are demonstrated on visualized imaging.  Left ovary  Measurements:  3.6 x 3 x 3 cm. Two separate cysts are demonstrated in the left ovary. Simple cyst measuring 1.4 x 1.5 x 1.1 cm. Complex cyst measuring 2.2 x 1.5 x 1.9 cm. The complex cyst demonstrates homogeneous low level increased echoes internally with peripheral flow demonstrated on color flow Doppler imaging. No central flow or nodularity is demonstrated. Appearance is most consistent with a hemorrhagic cyst.  Pulsed Doppler evaluation of both ovaries demonstrates normal low-resistance arterial and venous waveforms. Flow is demonstrated within both ovaries on color flow Doppler imaging.  Other findings  No free fluid.  IMPRESSION: Surgical absence of the uterus. Right ovary is not visualized. Left ovary demonstrates small simple cyst and complex cyst measuring 2.2 cm maximal diameter, likely hemorrhagic. This is likely benign in a premenopausal patient.   Electronically Signed   By: Lucienne Capers M.D.   On: 03/06/2014 03:02   US Pelvis Complete  03/06/2014   CLINICAL DATA:  Left-sided abdominal pain. Previous partial hysterectomy.  EXAM: TRANSABDOMINAL AND TRANSVAGINAL ULTRASOUND OF PELVIS  DOPPLER ULTRASOUND OF OVARIES  TECHNIQUE: Both transabdominal and transvaginal ultrasound examinations of the pelvis were performed.  Transabdominal technique was performed for global imaging of the pelvis including uterus, ovaries, adnexal regions, and pelvic cul-de-sac.  It was necessary to proceed with endovaginal exam following the transabdominal exam to visualize the ovaries. Color and duplex Doppler ultrasound was utilized to evaluate blood flow to the ovaries.  COMPARISON:  None.  FINDINGS: Uterus  Uterus is surgically absent.  Endometrium  Surgically absent.  Right ovary  Right ovary is not identified. No adnexal masses are demonstrated on visualized imaging.  Left ovary  Measurements: 3.6 x 3 x 3 cm. Two separate cysts are demonstrated in the left ovary. Simple cyst measuring 1.4 x 1.5 x 1.1 cm. Complex cyst measuring 2.2 x  1.5 x 1.9 cm. The complex cyst demonstrates homogeneous low level increased echoes internally with peripheral flow demonstrated on color flow Doppler imaging. No central flow or nodularity is demonstrated. Appearance is most consistent with a hemorrhagic cyst.  Pulsed Doppler evaluation of both ovaries demonstrates normal low-resistance arterial and venous waveforms. Flow is demonstrated within both ovaries on color flow Doppler imaging.  Other findings  No free fluid.  IMPRESSION: Surgical absence of the uterus. Right ovary is not visualized. Left ovary demonstrates small simple cyst and complex cyst measuring 2.2 cm maximal diameter, likely hemorrhagic. This is likely benign in a premenopausal patient.   Electronically Signed   By: Lucienne Capers M.D.   On: 03/06/2014 03:02   [place] Art/ven Flow Abd Pelv Doppler  03/06/2014   CLINICAL DATA:  Left-sided abdominal pain. Previous partial hysterectomy.  EXAM: TRANSABDOMINAL AND TRANSVAGINAL ULTRASOUND OF PELVIS  DOPPLER ULTRASOUND OF OVARIES  TECHNIQUE: Both transabdominal and transvaginal ultrasound examinations of the pelvis were performed. Transabdominal technique was performed for global imaging of the pelvis including uterus, ovaries, adnexal regions, and pelvic cul-de-sac.  It was necessary to proceed with endovaginal exam following the transabdominal exam to visualize the ovaries. Color and duplex Doppler ultrasound was utilized to evaluate blood flow to the ovaries.  COMPARISON:  None.  FINDINGS: Uterus  Uterus is surgically absent.  Endometrium  Surgically absent.  Right ovary  Right ovary is not identified. No adnexal masses are demonstrated on visualized imaging.  Left ovary  Measurements: 3.6 x 3 x 3 cm. Two separate cysts are demonstrated in the left ovary. Simple cyst measuring 1.4 x 1.5 x 1.1 cm. Complex cyst measuring 2.2 x 1.5 x 1.9 cm. The complex cyst demonstrates homogeneous low level increased echoes internally with peripheral flow demonstrated  on color flow Doppler imaging. No central flow or nodularity is demonstrated. Appearance is most consistent with a hemorrhagic cyst.  Pulsed Doppler evaluation of both ovaries demonstrates normal low-resistance arterial and venous waveforms. Flow is demonstrated within both ovaries on color flow Doppler imaging.  Other findings  No free fluid.  IMPRESSION: Surgical absence of the uterus. Right ovary is not visualized. Left ovary demonstrates small simple cyst and complex cyst measuring 2.2 cm maximal diameter, likely hemorrhagic. This is likely benign in a premenopausal patient.   Electronically Signed   By: Lucienne Capers M.D.   On: 03/06/2014 03:02     EKG Interpretation None      MDM   Final diagnoses:  Cyst of left ovary   Pt is well-appearing presenting with generalized abd pain, worse on the left and worsens with urination, walking and intercourse.  Urinalysis done is negative for infection.  Pain meds ordered. Pelvic exam for cultures and wet prep.  Wet Prep shows only few clue cells.   Urine preg  deferred due to hysterectomy.   No significant findings from pelvic exam to explain pt's abd pain.  Abd labs added including CBC, CMP, and lipase.  [place] ordered, shows left ovarian cyst.  No indication of appendicitis, bowel obstruction, bowel perforation, cholecystitis, diverticulitis, PID or ectopic pregnancy. Discharge instructions include use of ibuprofen for pain and follow-up at [facility] clinic. Pain managed and pt agreeable to plan. Return precautions include fever, chills, nausea, vomiting, or severe abdominal pain   Filed Vitals:   03/06/14 0322  BP: 120/55  Pulse: 64  Temp:   Resp: 16    Meds given in ED:  Medications  ibuprofen (ADVIL,MOTRIN) tablet 600 mg (600 mg Oral Given 03/05/14 2334)  HYDROcodone-acetaminophen (NORCO/VICODIN) 5-325 MG per tablet 1 tablet (1 tablet Oral Given 03/06/14 0106)    Discharge Medication List as of 03/06/2014  3:30 AM    START taking these  medications   Details  ibuprofen (ADVIL,MOTRIN) 800 MG tablet Take 1 tablet (800 mg total) by mouth 3 (three) times daily., Starting 03/06/2014, Until Discontinued, Print           Britt Bottom, NP 03/06/14 [phone]

## 2014-03-05 NOTE — ED Notes (Signed)
NP at bedside.

## 2014-03-05 NOTE — ED Notes (Addendum)
Patient c/o headache that changes from left to right intermittently, described as sharp, patient was seen at [facility] for same @2  weeks ago. Patient also c/o left abd pain, states it is worse when she urinates and with walking. Patient endorses frequency, but denies dysuria. Patient states she took a "pain reliever" @1630 , unable to name specific.

## 2014-03-06 ENCOUNTER — Emergency Department (HOSPITAL_COMMUNITY): Payer: Medicaid Other

## 2014-03-06 LAB — HIV ANTIBODY (ROUTINE TESTING W REFLEX): HIV 1&2 Ab, 4th Generation: NONREACTIVE

## 2014-03-06 LAB — WET PREP, GENITAL
Trich, Wet Prep: NONE SEEN
WBC, Wet Prep HPF POC: NONE SEEN
Yeast Wet Prep HPF POC: NONE SEEN

## 2014-03-06 LAB — COMPREHENSIVE METABOLIC PANEL
ALT: 18 U/L (ref 0–35)
AST: 17 U/L (ref 0–37)
Albumin: 3.5 g/dL (ref 3.5–5.2)
Alkaline Phosphatase: 57 U/L (ref 39–117)
Anion gap: 13 (ref 5–15)
BUN: 13 mg/dL (ref 6–23)
CO2: 23 mEq/L (ref 19–32)
Calcium: 9.3 mg/dL (ref 8.4–10.5)
Chloride: 104 mEq/L (ref 96–112)
Creatinine, Ser: 1 mg/dL (ref 0.50–1.10)
GFR calc Af Amer: 82 mL/min — ABNORMAL LOW (ref >90)
GFR calc non Af Amer: 71 mL/min — ABNORMAL LOW (ref >90)
Glucose, Bld: 80 mg/dL (ref 70–99)
Potassium: 3.8 mEq/L (ref 3.7–5.3)
Sodium: 140 mEq/L (ref 137–147)
Total Bilirubin: 0.2 mg/dL — ABNORMAL LOW (ref 0.3–1.2)
Total Protein: 6.8 g/dL (ref 6.0–8.3)

## 2014-03-06 LAB — CBC WITH DIFFERENTIAL/PLATELET
Basophils Absolute: 0 10*3/uL (ref 0.0–0.1)
Basophils Relative: 1 % (ref 0–1)
Eosinophils Absolute: 0.4 10*3/uL (ref 0.0–0.7)
Eosinophils Relative: 6 % — ABNORMAL HIGH (ref 0–5)
HCT: 37.4 % (ref 36.0–46.0)
Hemoglobin: 12.3 g/dL (ref 12.0–15.0)
Lymphocytes Relative: 31 % (ref 12–46)
Lymphs Abs: 1.9 10*3/uL (ref 0.7–4.0)
MCH: 30.7 pg (ref 26.0–34.0)
MCHC: 32.9 g/dL (ref 30.0–36.0)
MCV: 93.3 fL (ref 78.0–100.0)
Monocytes Absolute: 0.4 10*3/uL (ref 0.1–1.0)
Monocytes Relative: 6 % (ref 3–12)
Neutro Abs: 3.6 10*3/uL (ref 1.7–7.7)
Neutrophils Relative %: 56 % (ref 43–77)
Platelets: 263 10*3/uL (ref 150–400)
RBC: 4.01 MIL/uL (ref 3.87–5.11)
RDW: 14.7 % (ref 11.5–15.5)
WBC: 6.3 10*3/uL (ref 4.0–10.5)

## 2014-03-06 LAB — LIPASE, BLOOD: Lipase: 24 U/L (ref 11–59)

## 2014-03-06 LAB — RPR

## 2014-03-06 LAB — GC/CHLAMYDIA PROBE AMP
CT Probe RNA: NEGATIVE
GC Probe RNA: NEGATIVE

## 2014-03-06 MED ORDER — IBUPROFEN 800 MG PO TABS: 800.0000 mg | ORAL_TABLET | Freq: Three times a day (TID) | ORAL | Status: DC

## 2014-03-06 MED ORDER — HYDROCODONE-ACETAMINOPHEN 5-325 MG PO TABS
1.0000 | ORAL_TABLET | Freq: Once | ORAL | Status: AC
Start: 2014-03-06 — End: 2014-03-06
  Administered 2014-03-06: 1 via ORAL
  Filled 2014-03-06: qty 1

## 2014-03-06 NOTE — ED Provider Notes (Signed)
Medical screening examination/treatment/procedure(s) were performed by non-physician practitioner and as supervising physician I was immediately available for consultation/collaboration.    Johnna Acosta, MD 03/06/14 [phone]

## 2014-03-06 NOTE — Discharge Instructions (Signed)
Please follow the directions provided.  You have ovarian cysts that are causing your abdominal pain.  Be sure to follow-up with the ob-gyn and establish care with a primary care provider.    SEEK IMMEDIATE MEDICAL ATTENTION IF: The pain does not go away or becomes severe.  A temperature above 101 develops.  Repeated vomiting occurs (multiple episodes).  The pain becomes localized to portions of the abdomen. The right side could possibly be appendicitis. In an adult, the left lower portion of the abdomen could be colitis or diverticulitis.  Blood is being passed in stools or vomit (bright red or black tarry stools).  Return also if you develop chest pain, difficulty breathing, dizziness or fainting, or become confused, poorly responsive, or inconsolable (young children).   Emergency Department Resource Guide 1) Find a Doctor and Pay Out of Pocket Although you won't have to find out who is covered by your insurance plan, it is a good idea to ask around and get recommendations. You will then need to call the office and see if the doctor you have chosen will accept you as a new patient and what types of options they offer for patients who are self-pay. Some doctors offer discounts or will set up payment plans for their patients who do not have insurance, but you will need to ask so you aren't surprised when you get to your appointment.  2) Contact Your Local Health Department Not all health departments have doctors that can see patients for sick visits, but many do, so it is worth a call to see if yours does. If you don't know where your local health department is, you can check in your phone book. The CDC also has a tool to help you locate your state's health department, and many state websites also have listings of all of their local health departments.  3) Find a [facility] If your illness is not likely to be very severe or complicated, you may want to try a walk in clinic. These are popping up  all over the country in pharmacies, drugstores, and shopping centers. They're usually staffed by nurse practitioners or physician assistants that have been trained to treat common illnesses and complaints. They're usually fairly quick and inexpensive. However, if you have serious medical issues or chronic medical problems, these are probably not your best option.  No Primary Care Doctor: - Call Health Connect at  [phone] - they can help you locate a primary care doctor that  accepts your insurance, provides certain services, etc. - Physician Referral Service- [phone]  Chronic Pain Problems: Organization         Address  Phone   Notes  [facility]  [phone] Patients need to be referred by their primary care doctor.   Medication Assistance: Organization         Address  Phone   Notes  [facility] Medication [facility] [address] [place], [place] [address] [phone] --Must be a resident of [facility] -- Must have NO insurance coverage whatsoever (no Medicaid/ Medicare, etc.) -- The pt. MUST have a primary care doctor that directs their care regularly and follows them in the community   [facility]ist  [phone]   [employer]  6698400164    Agencies that provide inexpensive medical care: Organization         Address  Phone   Notes  [place]  [phone]   [facility]acarias Pontes  Internal Medicine    [phone]   [facility] [place], [place] [address] [phone]   [place] [address], [place] [phone]   Planned Parenthood    [phone]   [facility]    [phone]   [place] and [place] [address], [place] Phone:  [phone], Fax:  [phone] Hours of Operation:  9 am - 6 pm, M-F.  Also accepts Medicaid/Medicare and self-pay.  [facility] for [place], [address], [place] Phone: [phone], Fax: [phone]. Hours of Operation:  8:30 am - 5:30 pm, M-F.  Also accepts Medicaid and self-pay.  [facility] [place] [address] [place] Phone: [phone]   [place], [place], [place] [phone], Ext. [phone] Mondays & Thursdays: 7-9 AM.  First 15 patients are seen on a first come, first serve basis.    [place] Providers:  Organization         Address  Phone   Notes  [facility] [address], [place] [phone] Also accepts self-pay patients.  [facility] [address], [place]  [phone]   [place], [address], [place] [phone]   [facility] [address] [place] [phone]   Lucianne Lei [address], [place]   [phone] Only accepts [place] Access [place] patients after they have their name applied to their card.   Self-Pay (no insurance) in [facility]:  Organization         Address  Phone   Notes  Sickle Cell Patients, [facility] Internal Medicine [place] [phone]   [facility] Urgent Care [place] [phone]   [facility] Pontes [facility] [place]  [place], [place], [place] [phone]   [facility]/Dr. Osei-Bonsu  [address] [place] or [address], [place] [phone] Phone number for both [place] and [place] locations is the same.  [facility]gent [facility] and [facility] [address] [place] [phone]   [facility] [address], [place] or [address] [phone]   [facility] [address] [place] [phone], phone; [phone], fax Sees patients 1st and 3rd Saturday of every  month.  Must not qualify for public or private insurance (i.e. Medicaid, Medicare, [place] Health Choice, Veterans' Benefits)  Household income should be no more than 200% of the poverty level The clinic cannot treat you if you are pregnant or think you are pregnant  Sexually transmitted diseases are not treated at the clinic.    Dental Care: Organization         Address  Phone  Notes  [facility] [place] [phone] Accepts children up to age 21 who are enrolled in [place] or [place]; pregnant women with a Medicaid card; and children who have applied for Medicaid or [place] Health Choice, but were declined, whose parents can pay a reduced fee at time of service.  [facility] of [facility]  [address], [employer] (  [phone] Accepts children up to age 52 who are enrolled in Medicaid or [place]; pregnant women with a Medicaid card; and children who have applied for Medicaid or [place] Health Choice, but were declined, whose parents can pay a reduced fee at time of service.  [place] [facility] Access PRO[facility]  [place] [phone] Patients are seen by appointment only. Walk-ins are not accepted. [place] will see patients 48 years of age and older. Monday - Tuesday (8am-5pm) Most Wednesdays (8:30-5pm) $30 per visit, cash only  [facility] Adult Dental Access PROGRAM  [address], [facility] [phone] Patients are seen by appointment only. Walk-ins are not accepted. [place] will see patients 35 years of age and older. One Wednesday Evening (Monthly: Volunteer Based).  $30 per visit, cash only  [place]  [phone] for adults; Children under age 57, call Graduate Pediatric Dentistry at [phone]. Children aged 47-14, please call [phone] to request a pediatric application.  Dental  services are provided in all areas of dental care including fillings, crowns and bridges, complete and partial dentures, implants, gum treatment, root canals, and extractions. Preventive care is also provided. Treatment is provided to both adults and children. Patients are selected via a lottery and there is often a waiting list.   [facility] [address] [employer]  [phone] [email or web address]   [facility] [address] [place], [place] [phone], Ext. [phone] Second and Fourth Thursday of each month, opens at 6:30 AM; Clinic ends at 9 AM.  Patients are seen on a first-come first-served basis, and a limited number are seen during each clinic.   [facility]  [address] [place], [place] [phone]   Eligibility Requirements You must have lived in [place], [place], or [place] counties for at least the last three months.   You cannot be eligible for state or federal sponsored [employer], including [employer], [place], or [employer].   You generally cannot be eligible for healthcare insurance through your employer.    How to apply: Eligibility screenings are held every Tuesday and Wednesday afternoon from 1:00 pm until 4:00 pm. You do not need an appointment for the interview!  [facility] [address] [place], [place]   [place]  [place] Department  [place]  [phone]    Behavioral Health Resources in the Community: Intensive Outpatient Programs Organization         Address  Phone  Notes  [place]. [address] [place], [place] [phone]   [facility] Outpatient [address], [place], [place]   ADS: Alcohol & Drug Svcs [address], [place], [place]   [place] [address],    [place], [place] or [phone]   Substance Abuse Resources Organization         Address  Phone  Notes  Alcohol and Drug Services  [phone]   [place]  [phone]   [facility] [place]   Chinita Pester  [phone]   Residential & Outpatient Substance Abuse Program  [phone]   Psychological Services Organization         Address  Phone  Notes  [place]  [place]  [phone]  [place] [address] [place] or [phone]    Mobile Crisis Teams Organization         Address  Phone  Notes  [facility], Mobile Crisis Care Unit  [phone]   [facility]  [address] [place], [place]   [place] [address], [place] [phone]    Self-Help/Support Groups Organization         Address  Phone             Notes  [place]. of [place] - variety of support groups  [place] Call for more information  Narcotics Anonymous (NA), [facility] [address], [employer] [place]  2 meetings at this location   Special educational needs teacher         Address  Phone  Notes  [facility],    [place]  [phone]   [facility]  [address] [place] [phone], [place], [place]   [place], [place] [phone] Admissions: 8am-3pm M-F  Incentives Substance [place] [address]    [place], [place] [phone]   [facility] [address] [place], [place], [place]   [facility] [address]  [place], [place]   [facility] - Intensive Outpatient [address], [place], [place]   [facility] ([place].,  [place], [place]  [phone] or [phone]   [facility] (RTS) [address] [place], [place] Accepts Medicaid  [facility] [place] [address],  [place] [phone] Substance Abuse/Addiction Treatment   [facility] [place] Organization         Address  Phone  Notes  [facility]  [phone]   Domenic Schwab, PhD [address] Arlis [place], [place]   [phone] or [phone]   [place], [place] [phone]   [facility] [address] [place], [place] [phone] Insurance/Medicaid/sponsorship through [facility] and Families [address], [address] [place]                                    [place], [place] [phone] [place] [address], [place] [phone]    Dr. Adele Schilder  [phone]   [facility] Dept. 1) [address] [place] 2) [place] 3)  [place] [address], [place] [phone]  [phone]   [place] [phone] or [phone] (After Hours)

## 2014-03-06 NOTE — ED Notes (Signed)
US at bedside

## 2014-03-06 NOTE — ED Notes (Signed)
Pt ambulating independently w/ steady gait on d/c in no acute distress, A&Ox4. D/c instructions reviewed w/ pt and family - pt and family deny any further questions or concerns at present. Rx given x1  

## 2014-03-29 ENCOUNTER — Encounter (HOSPITAL_COMMUNITY): Payer: Self-pay | Admitting: Emergency Medicine

## 2014-03-29 ENCOUNTER — Emergency Department (HOSPITAL_COMMUNITY)
Admission: EM | Admit: 2014-03-29 | Discharge: 2014-03-29 | Disposition: A | Discharge status: Home / Self Care | Payer: Medicaid Other | Attending: Emergency Medicine | Admitting: Emergency Medicine

## 2014-03-29 DIAGNOSIS — F17200 Nicotine dependence, unspecified, uncomplicated: Secondary | ICD-10-CM | POA: Diagnosis not present

## 2014-03-29 DIAGNOSIS — G510 Bell's palsy: Secondary | ICD-10-CM | POA: Diagnosis not present

## 2014-03-29 DIAGNOSIS — Z791 Long term (current) use of non-steroidal anti-inflammatories (NSAID): Secondary | ICD-10-CM | POA: Insufficient documentation

## 2014-03-29 DIAGNOSIS — R51 Headache: Secondary | ICD-10-CM | POA: Diagnosis present

## 2014-03-29 DIAGNOSIS — E669 Obesity, unspecified: Secondary | ICD-10-CM | POA: Diagnosis not present

## 2014-03-29 HISTORY — DX: Obesity, unspecified: E66.9

## 2014-03-29 MED ORDER — PREDNISOLONE 15 MG/5ML PO SOLN: 60.0000 mg | Freq: Every day | ORAL | Status: DC

## 2014-03-29 MED ORDER — ACYCLOVIR 400 MG PO TABS: 1000.0000 mg | ORAL_TABLET | Freq: Three times a day (TID) | ORAL | Status: DC

## 2014-03-29 NOTE — ED Provider Notes (Signed)
CSN: [id number]     Arrival date & time 03/29/14  6295 History   First MD Initiated Contact with Patient 03/29/14 1009     Chief Complaint  Patient presents with  . Facial Pain     (Consider location/radiation/quality/duration/timing/severity/associated sxs/prior Treatment) HPI Comments: Patient is a 38 year old female with a past medical history of obesity who presents with right sided facial numbness and pain that started at 1:00am last night. Symptoms started gradually and progressively worsened since the onset. When she woke up this morning, the numbness and pain was worse than when she went to bed. The pain is described as "pins and needles" and is constant without radiation. She did not try anything for symptoms. No other associated symptoms. No aggravating/alleviating factors.    Past Medical History  Diagnosis Date  . Obesity    Past Surgical History  Procedure Laterality Date  . Abdominal hysterectomy    . Tubal ligation     History reviewed. No pertinent family history. History  Substance Use Topics  . Smoking status: Current Every Day Smoker -- 0.25 packs/day    Types: Cigarettes  . Smokeless tobacco: Not on file  . Alcohol Use: Yes     Comment: once a week   OB History   Grav Para Term Preterm Abortions TAB SAB Ect Mult Living                 Review of Systems  Constitutional: Negative for fever, chills and fatigue.  HENT: Negative for trouble swallowing.   Eyes: Negative for visual disturbance.  Respiratory: Negative for shortness of breath.   Cardiovascular: Negative for chest pain and palpitations.  Gastrointestinal: Negative for nausea, vomiting, abdominal pain and diarrhea.  Genitourinary: Negative for dysuria and difficulty urinating.  Musculoskeletal: Negative for arthralgias and neck pain.  Skin: Negative for color change.  Neurological: Positive for numbness. Negative for dizziness and weakness.  Psychiatric/Behavioral: Negative for dysphoric mood.       Allergies  No known allergies  Home Medications   Prior to Admission medications   Medication Sig Start Date End Date Taking? Authorizing Provider  acetaminophen (TYLENOL) 325 MG tablet Take 650 mg by mouth every 6 (six) hours as needed for headache.    Historical Provider, MD  cyclobenzaprine (FLEXERIL) 10 MG tablet Take 10 mg by mouth 3 (three) times daily as needed for muscle spasms.    Historical Provider, MD  ibuprofen (ADVIL,MOTRIN) 800 MG tablet Take 1 tablet (800 mg total) by mouth 3 (three) times daily. 03/06/14   Britt Bottom, NP   BP 143/92  Pulse 90  Temp(Src) 97.7 F (36.5 C) (Oral)  Resp 18  SpO2 100% Physical Exam  Nursing note and vitals reviewed. Constitutional: She is oriented to person, place, and time. She appears well-developed and well-nourished. No distress.  HENT:  Head: Normocephalic and atraumatic.  Right Ear: External ear normal.  Left Ear: External ear normal.  Nose: Nose normal.  Mouth/Throat: Oropharynx is clear and moist. No oropharyngeal exudate.  Patient reports diminished sensation to light touch over the right side of face.   Eyes: Conjunctivae and EOM are normal. Pupils are equal, round, and reactive to light. No scleral icterus.  Neck: Normal range of motion. Neck supple.  Cardiovascular: Normal rate and regular rhythm.  Exam reveals no gallop and no friction rub.   No murmur heard. Pulmonary/Chest: Effort normal and breath sounds normal. She has no wheezes. She has no rales. She exhibits no tenderness.  Abdominal:  Soft. She exhibits no distension. There is no tenderness. There is no rebound.  Musculoskeletal: Normal range of motion.  Lymphadenopathy:    She has no cervical adenopathy.  Neurological: She is alert and oriented to person, place, and time. No cranial nerve deficit. Coordination normal.  Extremity strength and sensation equal and intact bilaterally. Speech is goal-oriented. Moves limbs without ataxia.   Skin: Skin  is warm and dry.  Psychiatric: She has a normal mood and affect. Her behavior is normal.    ED Course  Procedures (including critical care time) Labs Review Labs Reviewed - No data to display  Imaging Review No results found.   EKG Interpretation None      MDM   Final diagnoses:  Right-sided Bell's palsy    10:36 AM Patient likely has developing Bell's palsy on the right side and will be treated with acyclovir and prednisolone. Patient's cranial nerves intact but patient reports diminished sensation on the right side of her face. Patient's right side of forehead and right corner of mouth seems to be slightly weaker than the left side. Dr. Ashok Cordia saw the patient and agrees with plan.     Alvina Chou, PA-C 03/29/14 1539

## 2014-03-29 NOTE — Discharge Instructions (Signed)
Take acycylovir as directed until gone. Take prednisolone for 10 days as instructed. Refer to attached documents for more information. Follow up with your doctor as needed.

## 2014-03-29 NOTE — ED Notes (Signed)
Pt reports waking up this am 1:00 with pain and numbness sensation to right side of face. Denies any dental pain. No facial droop noted.

## 2014-03-30 NOTE — ED Provider Notes (Signed)
Medical screening examination/treatment/procedure(s) were conducted as a shared visit with non-physician practitioner(s) and myself.  I personally evaluated the patient during the encounter.  Pt c/o right side face feeling funny, ?weak, in the past several hours. No ear pain, hearing loss or tinnitus. No headache. No extremity numbness/weakness, or loss of normal function. No change in vision or speech.   On exam ?v mild right facial weakness involving forehead as well.  Suspect v early Bells Palsy.    Mirna Mires, MD 03/30/14 [phone]

## 2014-04-01 ENCOUNTER — Emergency Department (HOSPITAL_COMMUNITY)
Admission: EM | Admit: 2014-04-01 | Discharge: 2014-04-01 | Disposition: A | Discharge status: Home / Self Care | Payer: Medicaid Other | Attending: Emergency Medicine | Admitting: Emergency Medicine

## 2014-04-01 ENCOUNTER — Encounter (HOSPITAL_COMMUNITY): Payer: Self-pay | Admitting: Emergency Medicine

## 2014-04-01 DIAGNOSIS — R51 Headache: Secondary | ICD-10-CM | POA: Diagnosis not present

## 2014-04-01 DIAGNOSIS — R519 Headache, unspecified: Secondary | ICD-10-CM

## 2014-04-01 DIAGNOSIS — E669 Obesity, unspecified: Secondary | ICD-10-CM | POA: Insufficient documentation

## 2014-04-01 DIAGNOSIS — Z79899 Other long term (current) drug therapy: Secondary | ICD-10-CM | POA: Insufficient documentation

## 2014-04-01 DIAGNOSIS — IMO0002 Reserved for concepts with insufficient information to code with codable children: Secondary | ICD-10-CM | POA: Insufficient documentation

## 2014-04-01 DIAGNOSIS — F17200 Nicotine dependence, unspecified, uncomplicated: Secondary | ICD-10-CM | POA: Diagnosis not present

## 2014-04-01 LAB — BASIC METABOLIC PANEL
Anion gap: 10 (ref 5–15)
BUN: 13 mg/dL (ref 6–23)
CO2: 21 mEq/L (ref 19–32)
Calcium: 8.1 mg/dL — ABNORMAL LOW (ref 8.4–10.5)
Chloride: 110 mEq/L (ref 96–112)
Creatinine, Ser: 1 mg/dL (ref 0.50–1.10)
GFR calc Af Amer: 82 mL/min — ABNORMAL LOW (ref >90)
GFR calc non Af Amer: 71 mL/min — ABNORMAL LOW (ref >90)
Glucose, Bld: 154 mg/dL — ABNORMAL HIGH (ref 70–99)
Potassium: 3.7 mEq/L (ref 3.7–5.3)
Sodium: 141 mEq/L (ref 137–147)

## 2014-04-01 LAB — I-STAT CHEM 8, ED
BUN: 12 mg/dL (ref 6–23)
Calcium, Ion: 1.1 mmol/L — ABNORMAL LOW (ref 1.12–1.23)
Chloride: 112 mEq/L (ref 96–112)
Creatinine, Ser: 1 mg/dL (ref 0.50–1.10)
Glucose, Bld: 154 mg/dL — ABNORMAL HIGH (ref 70–99)
HCT: 35 % — ABNORMAL LOW (ref 36.0–46.0)
Hemoglobin: 11.9 g/dL — ABNORMAL LOW (ref 12.0–15.0)
Potassium: 3.4 mEq/L — ABNORMAL LOW (ref 3.7–5.3)
Sodium: 143 mEq/L (ref 137–147)
TCO2: 19 mmol/L (ref 0–100)

## 2014-04-01 LAB — CBC
HCT: 33.3 % — ABNORMAL LOW (ref 36.0–46.0)
Hemoglobin: 11.5 g/dL — ABNORMAL LOW (ref 12.0–15.0)
MCH: 31.2 pg (ref 26.0–34.0)
MCHC: 34.5 g/dL (ref 30.0–36.0)
MCV: 90.2 fL (ref 78.0–100.0)
Platelets: 242 10*3/uL (ref 150–400)
RBC: 3.69 MIL/uL — ABNORMAL LOW (ref 3.87–5.11)
RDW: 13.8 % (ref 11.5–15.5)
WBC: 11.2 10*3/uL — ABNORMAL HIGH (ref 4.0–10.5)

## 2014-04-01 MED ORDER — MORPHINE SULFATE 4 MG/ML IJ SOLN
4.0000 mg | Freq: Once | INTRAMUSCULAR | Status: AC
  Administered 2014-04-01: 4 mg via INTRAVENOUS
  Filled 2014-04-01: qty 1

## 2014-04-01 MED ORDER — PROCHLORPERAZINE EDISYLATE 5 MG/ML IJ SOLN
10.0000 mg | Freq: Once | INTRAMUSCULAR | Status: AC
  Administered 2014-04-01: 10 mg via INTRAVENOUS
  Filled 2014-04-01: qty 2

## 2014-04-01 MED ORDER — SODIUM CHLORIDE 0.9 % IV SOLN
1000.0000 mL | Freq: Once | INTRAVENOUS | Status: AC
  Administered 2014-04-01: 1000 mL via INTRAVENOUS

## 2014-04-01 NOTE — Discharge Instructions (Signed)
General Headache Without Cause Lisa Haley, you were seen today for a headache that resolved in the ED with medication.  Your blood work did not reveal a cause of your headache.  Follow up with your regular doctor within 3 days for continued care.  If your symptoms return, or worsen, or if you develop numbness or muscle weakness, come back to the ED immediately for repeat evaluation. Thank you. A headache is pain or discomfort felt around the head or neck area. The specific cause of a headache may not be found. There are many causes and types of headaches. A few common ones are:  Tension headaches.  Migraine headaches.  Cluster headaches.  Chronic daily headaches. HOME CARE INSTRUCTIONS   Keep all follow-up appointments with your caregiver or any specialist referral.  Only take over-the-counter or prescription medicines for pain or discomfort as directed by your caregiver.  Lie down in a dark, quiet room when you have a headache.  Keep a headache journal to find out what may trigger your migraine headaches. For example, write down:  What you eat and drink.  How much sleep you get.  Any change to your diet or medicines.  Try massage or other relaxation techniques.  Put ice packs or heat on the head and neck. Use these 3 to 4 times per day for 15 to 20 minutes each time, or as needed.  Limit stress.  Sit up straight, and do not tense your muscles.  Quit smoking if you smoke.  Limit alcohol use.  Decrease the amount of caffeine you drink, or stop drinking caffeine.  Eat and sleep on a regular schedule.  Get 7 to 9 hours of sleep, or as recommended by your caregiver.  Keep lights dim if bright lights bother you and make your headaches worse. SEEK MEDICAL CARE IF:   You have problems with the medicines you were prescribed.  Your medicines are not working.  You have a change from the usual headache.  You have nausea or vomiting. SEEK IMMEDIATE MEDICAL CARE IF:    Your headache becomes severe.  You have a fever.  You have a stiff neck.  You have loss of vision.  You have muscular weakness or loss of muscle control.  You start losing your balance or have trouble walking.  You feel faint or pass out.  You have severe symptoms that are different from your first symptoms. MAKE SURE YOU:   Understand these instructions.  Will watch your condition.  Will get help right away if you are not doing well or get worse. Document Released: 06/26/2005 Document Revised: 09/18/2011 Document Reviewed: 07/12/2011 [facility] Patient Information 2015 [place], [place]. This information is not intended to replace advice given to you by your health care provider. Make sure you discuss any questions you have with your health care provider.

## 2014-04-01 NOTE — ED Provider Notes (Addendum)
CSN: [id number]     Arrival date & time 04/01/14  0140 History   First MD Initiated Contact with Patient 04/01/14 0240     Chief Complaint  Patient presents with  . Headache     (Consider location/radiation/quality/duration/timing/severity/associated sxs/prior Treatment) HPI Lisa Haley is a 38 y.o. female with no significant past medical history coming in with headache. Patient states this began around midnight. It is localized to the right side and was a thumping sensation. She states at that time she was lying down. She also experiences photophobia. She states this headache was gradual in onset and slowly progressed to become painful. She does have history of headaches in the past and denies taking any medication at home for treatment. Patient was recently seen here and discharged with Bell's palsy 2 days ago has been compliant with her prednisone and acyclovir. Patient denies any fevers, stiff neck, or neurological complaints. There's been no blurry vision muscle weakness or decreased sensation. Patient has no further complaints. She states her headache has now completely resolved after being given the Compazine and morphine while in triage..  10 Systems reviewed and are negative for acute change except as noted in the HPI.     Past Medical History  Diagnosis Date  . Obesity    Past Surgical History  Procedure Laterality Date  . Abdominal hysterectomy    . Tubal ligation     No family history on file. History  Substance Use Topics  . Smoking status: Current Every Day Smoker -- 0.25 packs/day    Types: Cigarettes  . Smokeless tobacco: Not on file  . Alcohol Use: Yes     Comment: once a week   OB History   Grav Para Term Preterm Abortions TAB SAB Ect Mult Living                 Review of Systems    Allergies  No known allergies  Home Medications   Prior to Admission medications   Medication Sig Start Date End Date Taking? Authorizing Provider   acetaminophen (TYLENOL) 325 MG tablet Take 650 mg by mouth every 6 (six) hours as needed for headache.   Yes Historical Provider, MD  acyclovir (ZOVIRAX) 400 MG tablet Take 2.5 tablets (1,000 mg total) by mouth 3 (three) times daily. 03/29/14  Yes Kaitlyn Szekalski, PA-C  prednisoLONE (ORAPRED) 15 MG/5ML solution Take 16.5-20 mg by mouth See admin instructions. Take 68ml by mouth daily for 5 days. Then take 16.7mg  by mouth daily for the next 5 days   Yes Historical Provider, MD   BP 145/103  Pulse 79  Temp(Src) 98.5 F (36.9 C) (Oral)  Resp 16  Ht 5\' 2"  (1.575 m)  Wt 282 lb (127.914 kg)  BMI 51.57 kg/m2  SpO2 98% Physical Exam  Nursing note and vitals reviewed. Constitutional: She is oriented to person, place, and time. She appears well-developed and well-nourished. No distress.  HENT:  Head: Normocephalic and atraumatic.  Nose: Nose normal.  Mouth/Throat: Oropharynx is clear and moist. No oropharyngeal exudate.  Eyes: Conjunctivae and EOM are normal. Pupils are equal, round, and reactive to light. No scleral icterus.  Neck: Normal range of motion. Neck supple. No JVD present. No tracheal deviation present. No thyromegaly present.  Cardiovascular: Normal rate, regular rhythm and normal heart sounds.  Exam reveals no gallop and no friction rub.   No murmur heard. Pulmonary/Chest: Effort normal and breath sounds normal. No respiratory distress. She has no wheezes. She exhibits no tenderness.  Abdominal: Soft. Bowel sounds are normal. She exhibits no distension and no mass. There is no tenderness. There is no rebound and no guarding.  Musculoskeletal: Normal range of motion. She exhibits no edema and no tenderness.  Lymphadenopathy:    She has no cervical adenopathy.  Neurological: She is alert and oriented to person, place, and time. No cranial nerve deficit. She exhibits normal muscle tone. Coordination normal.  Normal strength and sensation x4 extremities. Normal cerebellar testing.   Skin: Skin is warm and dry. No rash noted. She is not diaphoretic. No erythema. No pallor.    ED Course  Procedures (including critical care time) Labs Review Labs Reviewed  BASIC METABOLIC PANEL - Abnormal; Notable for the following:    Glucose, Bld 154 (*)    Calcium 8.1 (*)    GFR calc non Af Amer 71 (*)    GFR calc Af Amer 82 (*)    All other components within normal limits  CBC - Abnormal; Notable for the following:    WBC 11.2 (*)    RBC 3.69 (*)    Hemoglobin 11.5 (*)    HCT 33.3 (*)    All other components within normal limits  I-STAT CHEM 8, ED - Abnormal; Notable for the following:    Potassium 3.4 (*)    Glucose, Bld 154 (*)    Calcium, Ion 1.10 (*)    Hemoglobin 11.9 (*)    HCT 35.0 (*)    All other components within normal limits    Imaging Review No results found.   EKG Interpretation None      MDM   Final diagnoses:  None    Patient presents emergency department out of concern for headache. She does not bear any red flags for acute emergency. Her headache was gradual in onset, she denies it being an acute or thunderclap. She has no history of aneurysm in her or her family. She has no fever and no stiff neck. Patient does have a leukocytosis of 11.2. I do not believe this reflects an infection as this is a nonspecific finding and does not correspond with her history. I do not believe CT scan or lumbar puncture is warranted in this patient. Her symptoms have resolved with medication, and she'll be discharged home. Her vital signs remain within her normal limits, PCP followup was advised within 3 days. Return precautions given.    Everlene Balls, MD 04/01/14 6389  Everlene Balls, MD 04/01/14 3734

## 2014-04-01 NOTE — ED Notes (Signed)
Pt reports she is nauseated as well.

## 2014-04-01 NOTE — ED Notes (Signed)
Per EMS pt is from home, pt reports an ongoing headache for three days. PT states she was seen on Sunday and diagnosed with Bells Palsy. Pt followed up with her primary care provider yesterday and was told to contact a neurologist for follow up. Pt reports her headache increased greatly one hour ago, pt reports the pain has increased significantly. Pt has a sensitivity to light. Pt is A&O X4.

## 2014-04-03 ENCOUNTER — Ambulatory Visit: Payer: Medicaid Other | Admitting: Neurology

## 2014-04-13 ENCOUNTER — Ambulatory Visit: Payer: Medicaid Other | Admitting: Neurology

## 2014-04-27 ENCOUNTER — Ambulatory Visit: Payer: Medicaid Other | Admitting: Neurology

## 2014-12-14 ENCOUNTER — Emergency Department (HOSPITAL_COMMUNITY)
Admission: EM | Admit: 2014-12-14 | Discharge: 2014-12-14 | Disposition: A | Discharge status: Home / Self Care | Payer: Medicaid Other | Attending: Emergency Medicine | Admitting: Emergency Medicine

## 2014-12-14 ENCOUNTER — Encounter (HOSPITAL_COMMUNITY): Payer: Self-pay | Admitting: Emergency Medicine

## 2014-12-14 DIAGNOSIS — M25511 Pain in right shoulder: Secondary | ICD-10-CM | POA: Insufficient documentation

## 2014-12-14 DIAGNOSIS — E669 Obesity, unspecified: Secondary | ICD-10-CM | POA: Insufficient documentation

## 2014-12-14 DIAGNOSIS — Z720 Tobacco use: Secondary | ICD-10-CM | POA: Insufficient documentation

## 2014-12-14 DIAGNOSIS — Z79899 Other long term (current) drug therapy: Secondary | ICD-10-CM | POA: Insufficient documentation

## 2014-12-14 DIAGNOSIS — G8929 Other chronic pain: Secondary | ICD-10-CM | POA: Insufficient documentation

## 2014-12-14 MED ORDER — HYDROCODONE-ACETAMINOPHEN 5-325 MG PO TABS: 1.0000 | ORAL_TABLET | Freq: Four times a day (QID) | ORAL | Status: DC | PRN

## 2014-12-14 MED ORDER — IBUPROFEN 600 MG PO TABS: 600.0000 mg | ORAL_TABLET | Freq: Four times a day (QID) | ORAL | Status: DC | PRN

## 2014-12-14 NOTE — Discharge Instructions (Signed)
Acromioclavicular Injuries °The AC (acromioclavicular) joint is the joint in the shoulder where the collarbone (clavicle) meets the shoulder blade (scapula). The part of the shoulder blade connected to the collarbone is called the acromion. Common problems with and treatments for the AC joint are detailed below. °ARTHRITIS °Arthritis occurs when the joint has been injured and the smooth padding between the joints (cartilage) is lost. This is the wear and tear seen in most joints of the body if they have been overused. This causes the joint to produce pain and swelling which is worse with activity.  °AC JOINT SEPARATION °AC joint separation means that the ligaments connecting the acromion of the shoulder blade and collarbone have been damaged, and the two bones no longer line up. AC separations can be anywhere from mild to severe, and are "graded" depending upon which ligaments are torn and how badly they are torn. °· Grade I Injury: the least damage is done, and the AC joint still lines up. °· Grade II Injury: damage to the ligaments which reinforce the AC joint. In a Grade II injury, these ligaments are stretched but not entirely torn. When stressed, the AC joint becomes painful and unstable. °· Grade III Injury: AC and secondary ligaments are completely torn, and the collarbone is no longer attached to the shoulder blade. This results in deformity; a prominence of the end of the clavicle. °AC JOINT FRACTURE °AC joint fracture means that there has been a break in the bones of the AC joint, usually the end of the clavicle. °TREATMENT °TREATMENT OF AC ARTHRITIS °· There is currently no way to replace the cartilage damaged by arthritis. The best way to improve the condition is to decrease the activities which aggravate the problem. Application of ice to the joint helps decrease pain and soreness (inflammation). The use of non-steroidal anti-inflammatory medication is helpful. °· If less conservative measures do not  work, then cortisone shots (injections) may be used. These are anti-inflammatories; they decrease the soreness in the joint and swelling. °· If non-surgical measures fail, surgery may be recommended. The procedure is generally removal of a portion of the end of the clavicle. This is the part of the collarbone closest to your acromion which is stabilized with ligaments to the acromion of the shoulder blade. This surgery may be performed using a tube-like instrument with a light (arthroscope) for looking into a joint. It may also be performed as an open surgery through a small incision by the surgeon. Most patients will have good range of motion within 6 weeks and may return to all activity including sports by 8-12 weeks, barring complications. °TREATMENT OF AN AC SEPARATION °· The initial treatment is to decrease pain. This is best accomplished by immobilizing the arm in a sling and placing an ice pack to the shoulder for 20 to 30 minutes every 2 hours as needed. As the pain starts to subside, it is important to begin moving the fingers, wrist, elbow and eventually the shoulder in order to prevent a stiff or "frozen" shoulder. Instruction on when and how much to move the shoulder will be provided by your caregiver. The length of time needed to regain full motion and function depends on the amount or grade of the injury. Recovery from a Grade I AC separation usually takes 10 to 14 days, whereas a Grade III may take 6 to 8 weeks. °· Grade I and II separations usually do not require surgery. Even Grade III injuries usually allow return to full   activity with few restrictions. Treatment is also based on the activity demands of the injured shoulder. For example, a high level quarterback with an injured throwing arm will receive more aggressive treatment than someone with a desk job who rarely uses his/her arm for strenuous activities. In some cases, a painful lump may persist which could require a later surgery. Surgery  can be very successful, but the benefits must be weighed against the potential risks. TREATMENT OF AN AC JOINT FRACTURE Fracture treatment depends on the type of fracture. Sometimes a splint or sling may be all that is required. Other times surgery may be required for repair. This is more frequently the case when the ligaments supporting the clavicle are completely torn. Your caregiver will help you with these decisions and together you can decide what will be the best treatment. HOME CARE INSTRUCTIONS   Apply ice to the injury for 15-20 minutes each hour while awake for 2 days. Put the ice in a plastic bag and place a towel between the bag of ice and skin.  If a sling has been applied, wear it constantly for as long as directed by your caregiver, even at night. The sling or splint can be removed for bathing or showering or as directed. Be sure to keep the shoulder in the same place as when the sling is on. Do not lift the arm.  If a figure-of-eight splint has been applied it should be tightened gently by another person every day. Tighten it enough to keep the shoulders held back. Allow enough room to place the index finger between the body and strap. Loosen the splint immediately if there is numbness or tingling in the hands.  Take over-the-counter or prescription medicines for pain, discomfort or fever as directed by your caregiver.  If you or your child has received a follow up appointment, it is very important to keep that appointment in order to avoid long term complications, chronic pain or disability. SEEK MEDICAL CARE IF:   The pain is not relieved with medications.  There is increased swelling or discoloration that continues to get worse rather than better.  You or your child has been unable to follow up as instructed.  There is progressive numbness and tingling in the arm, forearm or hand. SEEK IMMEDIATE MEDICAL CARE IF:   The arm is numb, cold or pale.  There is increasing pain  in the hand, forearm or fingers. MAKE SURE YOU:   Understand these instructions.  Will watch your condition.  Will get help right away if you are not doing well or get worse. Document Released: 04/05/2005 Document Revised: 09/18/2011 Document Reviewed: 09/28/2008 [facility] Patient Information 2015 [place], [place]. This information is not intended to replace advice given to you by your health care provider. Make sure you discuss any questions you have with your health care provider.  Arthralgia Your caregiver has diagnosed you as suffering from an arthralgia. Arthralgia means there is pain in a joint. This can come from many reasons including:  Bruising the joint which causes soreness (inflammation) in the joint.  Wear and tear on the joints which occur as we grow older (osteoarthritis).  Overusing the joint.  Various forms of arthritis.  Infections of the joint. Regardless of the cause of pain in your joint, most of these different pains respond to anti-inflammatory drugs and rest. The exception to this is when a joint is infected, and these cases are treated with antibiotics, if it is a bacterial infection. HOME CARE  INSTRUCTIONS   Rest the injured area for as long as directed by your caregiver. Then slowly start using the joint as directed by your caregiver and as the pain allows. Crutches as directed may be useful if the ankles, knees or hips are involved. If the knee was splinted or casted, continue use and care as directed. If an stretchy or elastic wrapping bandage has been applied today, it should be removed and re-applied every 3 to 4 hours. It should not be applied tightly, but firmly enough to keep swelling down. Watch toes and feet for swelling, bluish discoloration, coldness, numbness or excessive pain. If any of these problems (symptoms) occur, remove the ace bandage and re-apply more loosely. If these symptoms persist, contact your caregiver or return to this location.  For  the first 24 hours, keep the injured extremity elevated on pillows while lying down.  Apply ice for 15-20 minutes to the sore joint every couple hours while awake for the first half day. Then 03-04 times per day for the first 48 hours. Put the ice in a plastic bag and place a towel between the bag of ice and your skin.  Wear any splinting, casting, elastic bandage applications, or slings as instructed.  Only take over-the-counter or prescription medicines for pain, discomfort, or fever as directed by your caregiver. Do not use aspirin immediately after the injury unless instructed by your physician. Aspirin can cause increased bleeding and bruising of the tissues.  If you were given crutches, continue to use them as instructed and do not resume weight bearing on the sore joint until instructed. Persistent pain and inability to use the sore joint as directed for more than 2 to 3 days are warning signs indicating that you should see a caregiver for a follow-up visit as soon as possible. Initially, a hairline fracture (break in bone) may not be evident on X-rays. Persistent pain and swelling indicate that further evaluation, non-weight bearing or use of the joint (use of crutches or slings as instructed), or further X-rays are indicated. X-rays may sometimes not show a small fracture until a week or 10 days later. Make a follow-up appointment with your own caregiver or one to whom we have referred you. A radiologist (specialist in reading X-rays) may read your X-rays. Make sure you know how you are to obtain your X-ray results. Do not assume everything is normal if you do not hear from [place]. SEEK MEDICAL CARE IF: Bruising, swelling, or pain increases. SEEK IMMEDIATE MEDICAL CARE IF:   Your fingers or toes are numb or blue.  The pain is not responding to medications and continues to stay the same or get worse.  The pain in your joint becomes severe.  You develop a fever over 102 F (38.9 C).  It  becomes impossible to move or use the joint. MAKE SURE YOU:   Understand these instructions.  Will watch your condition.  Will get help right away if you are not doing well or get worse. Document Released: 06/26/2005 Document Revised: 09/18/2011 Document Reviewed: 02/12/2008 [facility] Patient Information 2015 [place], [place]. This information is not intended to replace advice given to you by your health care provider. Make sure you discuss any questions you have with your health care provider.
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
## 2014-12-14 NOTE — ED Notes (Signed)
Ortho tech called. Sling not in stock.

## 2014-12-14 NOTE — Progress Notes (Signed)
Orthopedic Tech Progress Note Patient Details:  Lisa Haley 10-Jul-1976 [id number]  Ortho Devices Type of Ortho Device: Arm sling Ortho Device/Splint Location: rue Ortho Device/Splint Interventions: Application   Lisa Haley 12/14/2014, 10:59 AM

## 2015-05-31 ENCOUNTER — Emergency Department (HOSPITAL_COMMUNITY)
Admission: EM | Admit: 2015-05-31 | Discharge: 2015-05-31 | Disposition: A | Discharge status: Home / Self Care | Payer: Medicaid Other | Attending: Emergency Medicine | Admitting: Emergency Medicine

## 2015-05-31 ENCOUNTER — Emergency Department (HOSPITAL_COMMUNITY): Payer: Medicaid Other

## 2015-05-31 ENCOUNTER — Encounter (HOSPITAL_COMMUNITY): Payer: Self-pay | Admitting: Emergency Medicine

## 2015-05-31 DIAGNOSIS — Z79899 Other long term (current) drug therapy: Secondary | ICD-10-CM | POA: Insufficient documentation

## 2015-05-31 DIAGNOSIS — Y9389 Activity, other specified: Secondary | ICD-10-CM | POA: Diagnosis not present

## 2015-05-31 DIAGNOSIS — S4991XA Unspecified injury of right shoulder and upper arm, initial encounter: Secondary | ICD-10-CM | POA: Diagnosis present

## 2015-05-31 DIAGNOSIS — Y9289 Other specified places as the place of occurrence of the external cause: Secondary | ICD-10-CM | POA: Diagnosis not present

## 2015-05-31 DIAGNOSIS — Y998 Other external cause status: Secondary | ICD-10-CM | POA: Insufficient documentation

## 2015-05-31 DIAGNOSIS — E669 Obesity, unspecified: Secondary | ICD-10-CM | POA: Diagnosis not present

## 2015-05-31 DIAGNOSIS — W208XXA Other cause of strike by thrown, projected or falling object, initial encounter: Secondary | ICD-10-CM | POA: Insufficient documentation

## 2015-05-31 DIAGNOSIS — F17210 Nicotine dependence, cigarettes, uncomplicated: Secondary | ICD-10-CM | POA: Insufficient documentation

## 2015-05-31 DIAGNOSIS — M25511 Pain in right shoulder: Secondary | ICD-10-CM

## 2015-05-31 MED ORDER — ACETAMINOPHEN 325 MG PO TABS
650.0000 mg | ORAL_TABLET | Freq: Once | ORAL | Status: AC
  Administered 2015-05-31: 650 mg via ORAL
  Filled 2015-05-31: qty 2

## 2015-05-31 MED ORDER — NAPROXEN 250 MG PO TABS: 250.0000 mg | ORAL_TABLET | Freq: Two times a day (BID) | ORAL | Status: DC

## 2015-05-31 NOTE — Discharge Instructions (Signed)
Joint Pain  Joint pain, which is also called arthralgia, can be caused by many things. Joint pain often goes away when you follow your health care provider's instructions for relieving pain at home. However, joint pain can also be caused by conditions that require further treatment. Common causes of joint pain include:   Bruising in the area of the joint.   Overuse of the joint.   Wear and tear on the joints that occur with aging (osteoarthritis).   Various other forms of arthritis.   A buildup of a crystal form of uric acid in the joint (gout).   Infections of the joint (septic arthritis) or of the bone (osteomyelitis).  Your health care provider may recommend medicine to help with the pain. If your joint pain continues, additional tests may be needed to diagnose your condition.  HOME CARE INSTRUCTIONS  Watch your condition for any changes. Follow these instructions as directed to lessen the pain that you are feeling.   Take medicines only as directed by your health care provider.   Rest the affected area for as long as your health care provider says that you should. If directed to do so, raise the painful joint above the level of your heart while you are sitting or lying down.   Do not do things that cause or worsen pain.   If directed, apply ice to the painful area:    Put ice in a plastic bag.    Place a towel between your skin and the bag.    Leave the ice on for 20 minutes, 2-3 times per day.   Wear an elastic bandage, splint, or sling as directed by your health care provider. Loosen the elastic bandage or splint if your fingers or toes become numb and tingle, or if they turn cold and blue.   Begin exercising or stretching the affected area as directed by your health care provider. Ask your health care provider what types of exercise are safe for you.   Keep all follow-up visits as directed by your health care provider. This is important.  SEEK MEDICAL CARE IF:   Your pain increases, and medicine  does not help.   Your joint pain does not improve within 3 days.   You have increased bruising or swelling.   You have a fever.   You lose 10 lb (4.5 kg) or more without trying.  SEEK IMMEDIATE MEDICAL CARE IF:   You are not able to move the joint.   Your fingers or toes become numb or they turn cold and blue.     This information is not intended to replace advice given to you by your health care provider. Make sure you discuss any questions you have with your health care provider.     Document Released: 06/26/2005 Document Revised: 07/17/2014 Document Reviewed: 04/07/2014  Elsevier Interactive Patient Education 2016 [employer]      Shoulder Pain  The shoulder is the joint that connects your arms to your body. The bones that form the shoulder joint include the upper arm bone (humerus), the shoulder blade (scapula), and the collarbone (clavicle). The top of the humerus is shaped like a ball and fits into a rather flat socket on the scapula (glenoid cavity). A combination of muscles and strong, fibrous tissues that connect muscles to bones (tendons) support your shoulder joint and hold the ball in the socket. Small, fluid-filled sacs (bursae) are located in different areas of the joint. They act as cushions between   the bones and the overlying soft tissues and help reduce friction between the gliding tendons and the bone as you move your arm. Your shoulder joint allows a wide range of motion in your arm. This range of motion allows you to do things like scratch your back or throw a ball. However, this range of motion also makes your shoulder more prone to pain from overuse and injury.  Causes of shoulder pain can originate from both injury and overuse and usually can be grouped in the following four categories:   Redness, swelling, and pain (inflammation) of the tendon (tendinitis) or the bursae (bursitis).   Instability, such as a dislocation of the joint.   Inflammation of the joint  (arthritis).   Broken bone (fracture).  HOME CARE INSTRUCTIONS    Apply ice to the sore area.    Put ice in a plastic bag.    Place a towel between your skin and the bag.    Leave the ice on for 15-20 minutes, 3-4 times per day for the first 2 days, or as directed by your health care provider.   Stop using cold packs if they do not help with the pain.   If you have a shoulder sling or immobilizer, wear it as long as your caregiver instructs. Only remove it to shower or bathe. Move your arm as little as possible, but keep your hand moving to prevent swelling.   Squeeze a soft ball or foam pad as much as possible to help prevent swelling.   Only take over-the-counter or prescription medicines for pain, discomfort, or fever as directed by your caregiver.  SEEK MEDICAL CARE IF:    Your shoulder pain increases, or new pain develops in your arm, hand, or fingers.   Your hand or fingers become cold and numb.   Your pain is not relieved with medicines.  SEEK IMMEDIATE MEDICAL CARE IF:    Your arm, hand, or fingers are numb or tingling.   Your arm, hand, or fingers are significantly swollen or turn white or blue.  MAKE SURE YOU:    Understand these instructions.   Will watch your condition.   Will get help right away if you are not doing well or get worse.     This information is not intended to replace advice given to you by your health care provider. Make sure you discuss any questions you have with your health care provider.     Document Released: 04/05/2005 Document Revised: 07/17/2014 Document Reviewed: 10/19/2014  Elsevier Interactive Patient Education 2016 [employer]

## 2015-05-31 NOTE — ED Provider Notes (Signed)
CSN: [id number]     Arrival date & time 05/31/15  1219 History  By signing my name below, I, Hilda Lias, attest that this documentation has been prepared under the direction and in the presence of Will Ruth Tully, PA-C Electronically Signed: Hilda Lias, ED Scribe. 05/31/2015. 2:24 PM.  Chief Complaint  Patient presents with  . Shoulder Pain    The history is provided by the patient. No language interpreter was used.   HPI Comments: Lisa Haley is a 39 y.o. female who is obese who presents to the Emergency Department complaining of constant right shoulder pain that has been present since last night. Pt states that pain to her shoulder is more in the area of the right clavicle versus the acromioclavicular joint. Pt reports a dresser fell on her right shoulder while she was trying to take it upstairs. She reports a history of chronic right shoulder pain from a previous injury many years ago. Pt states she took Tylenol earlier today with moderate relief. Pt denies numbness, tingling, weakness to the area. Pt denies loss of conscioussess during the incident, nausea, vomiting, abdominal pain.    Past Medical History  Diagnosis Date  . Obesity    Past Surgical History  Procedure Laterality Date  . Abdominal hysterectomy    . Tubal ligation     No family history on file. Social History  Substance Use Topics  . Smoking status: Current Every Day Smoker -- 0.25 packs/day    Types: Cigarettes  . Smokeless tobacco: None  . Alcohol Use: Yes     Comment: once a week   OB History    No data available     Review of Systems  Constitutional: Negative for fever and chills.  Gastrointestinal: Negative for nausea, vomiting and abdominal pain.  Musculoskeletal: Positive for myalgias and arthralgias. Negative for neck pain and neck stiffness.  Skin: Negative for color change, rash and wound.  Neurological: Negative for weakness and numbness.      Allergies  No known  allergies  Home Medications   Prior to Admission medications   Medication Sig Start Date End Date Taking? Authorizing Provider  acyclovir (ZOVIRAX) 400 MG tablet Take 2.5 tablets (1,000 mg total) by mouth 3 (three) times daily. 03/29/14   Alvina Chou, PA-C  HYDROcodone-acetaminophen (NORCO/VICODIN) 5-325 MG per tablet Take 1 tablet by mouth every 6 (six) hours as needed. 12/14/14   Montine Circle, PA-C  ibuprofen (ADVIL,MOTRIN) 600 MG tablet Take 1 tablet (600 mg total) by mouth every 6 (six) hours as needed. 12/14/14   Montine Circle, PA-C  naproxen (NAPROSYN) 250 MG tablet Take 1 tablet (250 mg total) by mouth 2 (two) times daily with a meal. 05/31/15   Waynetta Pean, PA-C   BP 143/100 mmHg  Pulse 88  Temp(Src) 98.2 F (36.8 C) (Oral)  Resp 18  Ht 5\' 1"  (1.549 m)  Wt 130.636 kg  BMI 54.45 kg/m2  SpO2 98% Physical Exam  Constitutional: She is oriented to person, place, and time. She appears well-developed and well-nourished. No distress.  Nontoxic-appearing.  HENT:  Head: Normocephalic and atraumatic.  Right Ear: External ear normal.  Left Ear: External ear normal.  Eyes: Conjunctivae are normal. Right eye exhibits no discharge. Left eye exhibits no discharge.  Neck: Normal range of motion. No JVD present.  Cardiovascular: Normal rate and intact distal pulses.   Bilateral radial pulses are intact. Good capillary refill to her right distal fingertips.  Pulmonary/Chest: Effort normal. No respiratory distress.  Abdominal: Soft.  There is no tenderness.  Musculoskeletal: Normal range of motion. She exhibits tenderness. She exhibits no edema.  No pain to manipulation of the right AC joint. No TTP of humeral head. Good right shoulder ROM. Generalized tenderness over her clavicular area, but no bony point tenderness and no crepitus. No right shoulder ecchymosis, edema or erythema.  Right arm is able to cross the midline. Bilateral radial pulses intact.    Lymphadenopathy:    She  has no cervical adenopathy.  Neurological: She is alert and oriented to person, place, and time. Coordination normal.  Sensation intact to her bilateral upper extremities.  Skin: Skin is warm and dry. No rash noted. She is not diaphoretic. No erythema. No pallor.  Psychiatric: She has a normal mood and affect. Her behavior is normal.  Nursing note and vitals reviewed.   ED Course  Procedures (including critical care time)  DIAGNOSTIC STUDIES: Oxygen Saturation is 98% on room air, normal by my interpretation.    COORDINATION OF CARE: 2:23 PM Discussed treatment plan with pt at bedside and pt agreed to plan.   Labs Review Labs Reviewed - No data to display  Imaging Review Dg Shoulder Right  05/31/2015  CLINICAL DATA:  Shoulder injury last night EXAM: RIGHT SHOULDER - 2+ VIEW COMPARISON:  None. FINDINGS: The study is limited secondary to technique and body habitus. There is overriding of the humeral neck and inferior humeral head articular surface. Impaction fracture is not excluded. No evidence of dislocation. Chronic appearing bony density in the superior lateral humeral head. IMPRESSION: Impaction fracture at the inferior humeral neck cannot be excluded. Repeat study or CT can be performed to further delineate. Electronically Signed   By: Marybelle Killings M.D.   On: 05/31/2015 14:06   I have personally reviewed and evaluated these images as part of my medical decision-making.   EKG Interpretation None      Filed Vitals:   05/31/15 1248  BP: 143/100  Pulse: 88  Temp: 98.2 F (36.8 C)  TempSrc: Oral  Resp: 18  Height: 5\' 1"  (1.549 m)  Weight: 130.636 kg  SpO2: 98%     MDM   Meds given in ED:  Medications  acetaminophen (TYLENOL) tablet 650 mg (not administered)    New Prescriptions   NAPROXEN (NAPROSYN) 250 MG TABLET    Take 1 tablet (250 mg total) by mouth 2 (two) times daily with a meal.    Final diagnoses:  Right shoulder pain   This is a 39 year old female  with a history of right chronic shoulder pain who presents to the emergency department complaining of right shoulder pain onset since last night. Patient reports she was moving a [employer] when it fell onto her shoulder. Patient denies numbness, tingling or weakness. She claims a 6 out of 10 pain to her right shoulder. On exam patient is afebrile nontoxic-appearing. She is good range of motion of her right shoulder. She is up across her midline. No bony point tenderness. Patient is more tender over her clavicle area. No ecchymosis, edema or erythema. No bony point tenderness. No crepitus. Right shoulder x-ray indicates a possible impaction fracture at the inferior humeral neck. I discussed these findings with the patient and she reports this is from an old injury. She denies pain at her humeral head. She has good range of motion and strength at her right shoulder. I do not see a need for further imaging at this time. Will discharge with close follow up by PCP and strict  return precautions. I advised the patient to follow-up with their primary care provider this week. I advised the patient to return to the emergency department with new or worsening symptoms or new concerns. The patient verbalized understanding and agreement with plan.    I personally performed the services described in this documentation, which was scribed in my presence. The recorded information has been reviewed and is accurate.     Waynetta Pean, PA-C 05/31/15 1448  Harvel Quale, MD 05/31/15 2218

## 2015-05-31 NOTE — ED Notes (Signed)
sts a dresser fell on her right shoulder last night, no other complaints, NAD

## 2015-06-01 ENCOUNTER — Emergency Department (HOSPITAL_COMMUNITY)
Admission: EM | Admit: 2015-06-01 | Discharge: 2015-06-01 | Disposition: A | Discharge status: Home / Self Care | Payer: Medicaid Other | Attending: Emergency Medicine | Admitting: Emergency Medicine

## 2015-06-01 ENCOUNTER — Encounter (HOSPITAL_COMMUNITY): Payer: Self-pay | Admitting: *Deleted

## 2015-06-01 ENCOUNTER — Emergency Department (HOSPITAL_COMMUNITY): Payer: Medicaid Other

## 2015-06-01 DIAGNOSIS — S61512A Laceration without foreign body of left wrist, initial encounter: Secondary | ICD-10-CM | POA: Diagnosis present

## 2015-06-01 DIAGNOSIS — Y9389 Activity, other specified: Secondary | ICD-10-CM | POA: Diagnosis not present

## 2015-06-01 DIAGNOSIS — S66822A Laceration of other specified muscles, fascia and tendons at wrist and hand level, left hand, initial encounter: Secondary | ICD-10-CM | POA: Diagnosis not present

## 2015-06-01 DIAGNOSIS — Y9289 Other specified places as the place of occurrence of the external cause: Secondary | ICD-10-CM | POA: Diagnosis not present

## 2015-06-01 DIAGNOSIS — Z23 Encounter for immunization: Secondary | ICD-10-CM | POA: Insufficient documentation

## 2015-06-01 DIAGNOSIS — F17210 Nicotine dependence, cigarettes, uncomplicated: Secondary | ICD-10-CM | POA: Insufficient documentation

## 2015-06-01 DIAGNOSIS — W291XXA Contact with electric knife, initial encounter: Secondary | ICD-10-CM | POA: Diagnosis not present

## 2015-06-01 DIAGNOSIS — E669 Obesity, unspecified: Secondary | ICD-10-CM | POA: Insufficient documentation

## 2015-06-01 DIAGNOSIS — Y998 Other external cause status: Secondary | ICD-10-CM | POA: Diagnosis not present

## 2015-06-01 MED ORDER — TETANUS-DIPHTH-ACELL PERTUSSIS 5-2.5-18.5 LF-MCG/0.5 IM SUSP
0.5000 mL | Freq: Once | INTRAMUSCULAR | Status: AC
Start: 2015-06-01 — End: 2015-06-01
  Administered 2015-06-01: 0.5 mL via INTRAMUSCULAR
  Filled 2015-06-01: qty 0.5

## 2015-06-01 MED ORDER — LIDOCAINE-EPINEPHRINE (PF) 2 %-1:200000 IJ SOLN
20.0000 mL | Freq: Once | INTRAMUSCULAR | Status: AC
  Administered 2015-06-01: 20 mL
  Filled 2015-06-01: qty 20

## 2015-06-01 MED ORDER — NAPROXEN 500 MG PO TABS: 500.0000 mg | ORAL_TABLET | Freq: Two times a day (BID) | ORAL | Status: DC

## 2015-06-01 MED ORDER — CEPHALEXIN 500 MG PO CAPS: 500.0000 mg | ORAL_CAPSULE | Freq: Four times a day (QID) | ORAL | Status: DC

## 2015-06-01 NOTE — ED Notes (Signed)
Pt arrives to the ER via EMS with left wrist laceration; pt states that she has been taking Tequila shots and drinking beer tonight and decided to try her electric knife before Thanksgiving; pt with 2.5 to 3 in laceration to left wrist; pt denies SI / HI; pt states that it was an accident and that she was not trying to hurt herself; pt wrist cleaned and bandaged by EMS; no additional bleeding in triage

## 2015-06-01 NOTE — ED Provider Notes (Signed)
2:56 AM - Pt s/p a 6 and a half cm left wrist laceration. Pt explains that she was drinking tonight while playing with an electric knife and accidentally cut her own left wrist with it.   Patient has a 6.5 cm laceration along with Flexeril crease of the volar aspect of the left wrist. She has good flexion and extension. The area has already been numbed so the tissues are swollen.  Medical screening examination/treatment/procedure(s) were conducted as a shared visit with non-physician practitioner(s) and myself.  I personally evaluated the patient during the encounter.   EKG Interpretation None       Rolland Porter, MD, Barbette Or, MD 06/01/15 [phone]

## 2015-06-01 NOTE — ED Notes (Signed)
Pt has a laceration on her left wrist from an electric knife. Bleeding is controled

## 2015-06-01 NOTE — Discharge Instructions (Signed)
1. Medications: Tylenol or ibuprofen for pain, Keflex, usual home medications 2. Treatment: ice for swelling, keep wound clean with warm soap and water and keep bandage dry, do not submerge in water for 24 hours 3. Follow Up: Please follow-up with Dr. Caralyn Guile as directed. Return to the emergency department for increased redness, drainage of pus from the wound   WOUND CARE  Keep area clean and dry for 24 hours. Do not remove bandage, if applied.  After 24 hours, remove bandage and wash wound gently with mild soap and warm water. Reapply a new bandage after cleaning wound, if directed.   Continue daily cleansing with soap and water until stitches/staples are removed.  Do not apply any ointments or creams to the wound while stitches/staples are in place, as this may cause delayed healing. Return if you experience any of the following signs of infection: Swelling, redness, pus drainage, streaking, fever >101.0 F  Return if you experience excessive bleeding that does not stop after 15-20 minutes of constant, firm pressure.

## 2015-06-01 NOTE — ED Provider Notes (Signed)
CSN: [id number]     Arrival date & time 06/01/15  0007 History   First MD Initiated Contact with Patient 06/01/15 0202     Chief Complaint  Patient presents with  . Extremity Laceration     (Consider location/radiation/quality/duration/timing/severity/associated sxs/prior Treatment) HPI   Lisa Haley is a 39 y.o. female  with a hx of obesity presents to the Emergency Department complaining of acute, persistent laceration of the left wrist. Patient reports that she was taking tequila shots and drinking beer tonight when she decided to try her electric knife.  She reports that it slipped, lacerating her left wrist. Patient denies suicidal or homicidal ideation stating that this was an accident.  Hemostasis achieved before arrival. Patient denies numbness, tingling, weakness of the hand or wrist. She denies fever, chills, nausea, vomiting.     Past Medical History  Diagnosis Date  . Obesity    Past Surgical History  Procedure Laterality Date  . Abdominal hysterectomy    . Tubal ligation     No family history on file. Social History  Substance Use Topics  . Smoking status: Current Every Day Smoker -- 0.50 packs/day    Types: Cigarettes  . Smokeless tobacco: None  . Alcohol Use: Yes     Comment: once a week   OB History    No data available     Review of Systems  Constitutional: Negative for fever.  Gastrointestinal: Negative for nausea and vomiting.  Skin: Positive for wound.  Allergic/Immunologic: Negative for immunocompromised state.  Neurological: Negative for weakness and numbness.  Hematological: Does not bruise/bleed easily.  Psychiatric/Behavioral: The patient is not nervous/anxious.       Allergies  No known allergies  Home Medications   Prior to Admission medications   Medication Sig Start Date End Date Taking? Authorizing Provider  cephALEXin (KEFLEX) 500 MG capsule Take 1 capsule (500 mg total) by mouth 4 (four) times daily. 06/01/15   Jeannene Tschetter, PA-C  naproxen (NAPROSYN) 500 MG tablet Take 1 tablet (500 mg total) by mouth 2 (two) times daily with a meal. 06/01/15   Olamide Carattini, PA-C   BP 170/96 mmHg  Pulse 92  Temp(Src) 98 F (36.7 C) (Oral)  Resp 18  Ht 4\' 9"  (1.448 m)  Wt 117.028 kg  BMI 55.82 kg/m2  SpO2 100% Physical Exam  Constitutional: She is oriented to person, place, and time. She appears well-developed and well-nourished. No distress.  HENT:  Head: Normocephalic and atraumatic.  Eyes: Conjunctivae are normal. No scleral icterus.  Neck: Normal range of motion.  Cardiovascular: Normal rate, regular rhythm, normal heart sounds and intact distal pulses.   No murmur heard. Capillary refill < 3 sec  Pulmonary/Chest: Effort normal and breath sounds normal. No respiratory distress.  Musculoskeletal: Normal range of motion. She exhibits no edema.  ROM: Range of motion of the left wrist and all fingers of the left hand  Neurological: She is alert and oriented to person, place, and time.  Sensation: Intact to dull and sharp Strength: 4/5 with flexion-extension of the wrist due to pain; 5/5 with flexion-extension of the fingers and strong grip strength  Skin: Skin is warm and dry. She is not diaphoretic.  0.5 cm laceration to the left wrist with superficial tendon involvement  Psychiatric: She has a normal mood and affect.  Nursing note and vitals reviewed.       ED Course  .Marland KitchenLaceration Repair Date/Time: 06/01/2015 3:50 AM Performed by: Abigail Butts Authorized by: Abigail Butts Consent:  Verbal consent obtained. Risks and benefits: risks, benefits and alternatives were discussed Consent given by: patient Patient understanding: patient states understanding of the procedure being performed Patient consent: the patient's understanding of the procedure matches consent given Procedure consent: procedure consent matches procedure scheduled Relevant documents: relevant documents  present and verified Site marked: the operative site was marked Required items: required blood products, implants, devices, and special equipment available Patient identity confirmed: verbally with patient and arm band Time out: Immediately prior to procedure a "time out" was called to verify the correct patient, procedure, equipment, support staff and site/side marked as required. Body area: upper extremity Location details: left wrist Laceration length: 6.5 cm Foreign bodies: no foreign bodies Tendon involvement: superficial Nerve involvement: none Vascular damage: no Anesthesia: local infiltration Local anesthetic: lidocaine 2% with epinephrine Anesthetic total: 8 ml Patient sedated: no Preparation: Patient was prepped and draped in the usual sterile fashion. Irrigation solution: saline Irrigation method: syringe Amount of cleaning: extensive Debridement: none Degree of undermining: none Skin closure: 5-0 Prolene Number of sutures: 14 Technique: simple Approximation: close Approximation difficulty: complex Dressing: 4x4 sterile gauze Patient tolerance: Patient tolerated the procedure well with no immediate complications   (including critical care time) Labs Review Labs Reviewed - No data to display  Imaging Review Dg Shoulder Right  05/31/2015  CLINICAL DATA:  Shoulder injury last night EXAM: RIGHT SHOULDER - 2+ VIEW COMPARISON:  None. FINDINGS: The study is limited secondary to technique and body habitus. There is overriding of the humeral neck and inferior humeral head articular surface. Impaction fracture is not excluded. No evidence of dislocation. Chronic appearing bony density in the superior lateral humeral head. IMPRESSION: Impaction fracture at the inferior humeral neck cannot be excluded. Repeat study or CT can be performed to further delineate. Electronically Signed   By: Marybelle Killings M.D.   On: 05/31/2015 14:06   Dg Wrist Complete Left  06/01/2015  CLINICAL  DATA:  Cut left wrist with electric knife, while working with knife. Initial encounter. EXAM: LEFT WRIST - COMPLETE 3+ VIEW COMPARISON:  None. FINDINGS: A soft tissue laceration is noted at the radial volar aspect of the wrist. No radiopaque foreign bodies are seen. There is no evidence of fracture or dislocation. The carpal rows are intact, and demonstrate normal alignment. The joint spaces are preserved. IMPRESSION: No evidence of fracture or dislocation. No radiopaque foreign bodies seen. Electronically Signed   By: Garald Balding M.D.   On: 06/01/2015 03:14   I have personally reviewed and evaluated these images and lab results as part of my medical decision-making.   EKG Interpretation None      MDM   Final diagnoses:  Wrist laceration, left, initial encounter   Jacqualine Mcewen 6.5 cm laceration to the left wrist with involvement of the flexor tendon. It appears the patient has an incomplete laceration of the boxer tendon of the left wrist. Patient does have full range of motion. Will consult hand surgery.  The patient was discussed with and seen by Dr. Tomi Bamberger who agrees with the treatment plan.  3:50 AM Discussed with Dr. Caralyn Guile who requests closure in the ED and outpatient follow-up.   4:25 AM Pressure irrigation performed. Wound explored and base of wound visualized in a bloodless field without evidence of foreign body.  Laceration occurred < 8 hours prior to repair which was well tolerated. Splint placed. Tdap updated.  Pt has no comorbidities to effect normal wound healing. Pt discharged with Keflex.  Discussed suture home  care with patient and answered questions. Pt to follow-up for wound check and further discussion of repair with Dr. Caralyn Guile by calling his office tomorrow morning to schedule an appointment; they are to return to the ED sooner for signs of infection. Pt is hemodynamically stable with no complaints prior to dc.   BP 170/96 mmHg  Pulse 92  Temp(Src) 98 F  (36.7 C) (Oral)  Resp 18  Ht 4\' 9"  (1.448 m)  Wt 117.028 kg  BMI 55.82 kg/m2  SpO2 100%    Abigail Butts, PA-C 06/01/15 [id number]  Rolland Porter, MD 06/01/15 [phone]

## 2015-06-01 NOTE — ED Notes (Signed)
Bed: [facility] Expected date:  Expected time:  Means of arrival:  Comments: EMS 39 yo female laceration left wrist, accidental by electric knife, intoxicated

## 2015-07-22 ENCOUNTER — Ambulatory Visit: Payer: Medicaid Other | Admitting: Occupational Therapy

## 2015-07-27 ENCOUNTER — Ambulatory Visit: Payer: Medicaid Other | Attending: Orthopedic Surgery | Admitting: Occupational Therapy

## 2015-08-05 ENCOUNTER — Ambulatory Visit: Payer: Medicaid Other | Admitting: Occupational Therapy

## 2016-02-01 ENCOUNTER — Encounter (HOSPITAL_COMMUNITY): Payer: Self-pay | Admitting: *Deleted

## 2016-02-01 DIAGNOSIS — R1010 Upper abdominal pain, unspecified: Secondary | ICD-10-CM | POA: Diagnosis present

## 2016-02-01 DIAGNOSIS — R1084 Generalized abdominal pain: Secondary | ICD-10-CM | POA: Diagnosis not present

## 2016-02-01 DIAGNOSIS — F17210 Nicotine dependence, cigarettes, uncomplicated: Secondary | ICD-10-CM | POA: Diagnosis not present

## 2016-02-01 LAB — URINALYSIS, ROUTINE W REFLEX MICROSCOPIC
Glucose, UA: NEGATIVE mg/dL
Hgb urine dipstick: NEGATIVE
Ketones, ur: 15 mg/dL — AB
Leukocytes, UA: NEGATIVE
Nitrite: NEGATIVE
Protein, ur: NEGATIVE mg/dL
Specific Gravity, Urine: 1.034 — ABNORMAL HIGH (ref 1.005–1.030)
pH: 6 (ref 5.0–8.0)

## 2016-02-01 LAB — LIPASE, BLOOD: Lipase: 26 U/L (ref 11–51)

## 2016-02-01 LAB — COMPREHENSIVE METABOLIC PANEL
ALT: 27 U/L (ref 14–54)
AST: 23 U/L (ref 15–41)
Albumin: 3.1 g/dL — ABNORMAL LOW (ref 3.5–5.0)
Alkaline Phosphatase: 60 U/L (ref 38–126)
Anion gap: 4 — ABNORMAL LOW (ref 5–15)
BUN: 10 mg/dL (ref 6–20)
CO2: 28 mmol/L (ref 22–32)
Calcium: 9.1 mg/dL (ref 8.9–10.3)
Chloride: 107 mmol/L (ref 101–111)
Creatinine, Ser: 1.07 mg/dL — ABNORMAL HIGH (ref 0.44–1.00)
GFR calc Af Amer: >60 mL/min (ref >60)
GFR calc non Af Amer: >60 mL/min (ref >60)
Glucose, Bld: 100 mg/dL — ABNORMAL HIGH (ref 65–99)
Potassium: 3.7 mmol/L (ref 3.5–5.1)
Sodium: 139 mmol/L (ref 135–145)
Total Bilirubin: 0.3 mg/dL (ref 0.3–1.2)
Total Protein: 6 g/dL — ABNORMAL LOW (ref 6.5–8.1)

## 2016-02-01 LAB — CBC
HCT: 37.8 % (ref 36.0–46.0)
Hemoglobin: 12.4 g/dL (ref 12.0–15.0)
MCH: 30.7 pg (ref 26.0–34.0)
MCHC: 32.8 g/dL (ref 30.0–36.0)
MCV: 93.6 fL (ref 78.0–100.0)
Platelets: 321 10*3/uL (ref 150–400)
RBC: 4.04 MIL/uL (ref 3.87–5.11)
RDW: 13 % (ref 11.5–15.5)
WBC: 5.4 10*3/uL (ref 4.0–10.5)

## 2016-02-02 ENCOUNTER — Emergency Department (HOSPITAL_COMMUNITY)
Admission: EM | Admit: 2016-02-02 | Discharge: 2016-02-02 | Disposition: A | Discharge status: Home / Self Care | Payer: Medicaid Other | Attending: Emergency Medicine | Admitting: Emergency Medicine

## 2016-02-02 DIAGNOSIS — R1084 Generalized abdominal pain: Secondary | ICD-10-CM

## 2016-02-02 MED ORDER — FENTANYL CITRATE (PF) 100 MCG/2ML IJ SOLN
50.0000 ug | Freq: Once | INTRAMUSCULAR | Status: AC
  Administered 2016-02-02: 50 ug via INTRAVENOUS
  Filled 2016-02-02: qty 2

## 2016-02-02 MED ORDER — RANITIDINE HCL 150 MG/10ML PO SYRP
300.0000 mg | ORAL_SOLUTION | Freq: Once | ORAL | Status: AC
  Administered 2016-02-02: 300 mg via ORAL
  Filled 2016-02-02: qty 20

## 2016-02-02 MED ORDER — LOPERAMIDE HCL 2 MG PO CAPS
4.0000 mg | ORAL_CAPSULE | Freq: Once | ORAL | Status: AC
  Administered 2016-02-02: 4 mg via ORAL
  Filled 2016-02-02: qty 2

## 2016-02-02 MED ORDER — SODIUM CHLORIDE 0.9 % IV BOLUS (SEPSIS)
1000.0000 mL | Freq: Once | INTRAVENOUS | Status: AC
  Administered 2016-02-02: 1000 mL via INTRAVENOUS

## 2016-02-02 NOTE — ED Provider Notes (Signed)
[facility] DEPT Provider Note   CSN: [id number] Arrival date & time: 02/01/16  2057  First Provider Contact:  1:30 AM    By signing my name below, I, Lisa Haley, attest that this documentation has been prepared under the direction and in the presence of Everlene Balls, MD. Electronically Signed: Reola Haley, ED Scribe. 02/02/16. 1:32 AM.  History   Chief Complaint Chief Complaint  Patient presents with  . Abdominal Pain   The history is provided by the patient. No language interpreter was used.   HPI Comments: Lisa Haley is a 40 y.o. female with a PMHx of obesity, gastritis, and ulcers, who presents to the Emergency Department complaining of gradual onset, gradually worsening, constant, 7/10, cramping, upper abdominal pain onset PTA. Pt has had associated diarrhea and nausea since the onset of her pain. Her pain is exacerbated with food and fluid intake. No OTC medications or home remedies tried PTA.  She denies her symptoms feeling similar to her previous pain with Gastritis or ulcers. She states that her kids have recently had a virus, but notes that her symptoms have persisted longer than theirs originally did. Pt has had a tubal ligation and an abdominal hysterectomy in the past. Denies vomiting, urinary issues, hematuria, dysuria, vaginal bleeding, vaginal discharge, or any other symptoms.  Past Medical History:  Diagnosis Date  . Obesity     There are no active problems to display for this patient.  Past Surgical History:  Procedure Laterality Date  . ABDOMINAL HYSTERECTOMY    . TUBAL LIGATION      OB History    No data available     Home Medications    Prior to Admission medications   Medication Sig Start Date End Date Taking? Authorizing Provider  cephALEXin (KEFLEX) 500 MG capsule Take 1 capsule (500 mg total) by mouth 4 (four) times daily. 06/01/15   Hannah Muthersbaugh, PA-C  naproxen (NAPROSYN) 500 MG tablet Take 1 tablet (500 mg  total) by mouth 2 (two) times daily with a meal. 06/01/15   Jarrett Soho Muthersbaugh, PA-C    Family History History reviewed. No pertinent family history.  Social History Social History  Substance Use Topics  . Smoking status: Current Every Day Smoker    Packs/day: 0.50    Types: Cigarettes  . Smokeless tobacco: Never Used  . Alcohol use Yes     Comment: once a week     Allergies   No known allergies   Review of Systems Review of Systems 10 Systems reviewed and all are negative for acute change except as noted in the HPI.  Physical Exam Updated Vital Signs BP 132/82   Pulse 86   Temp 98.4 F (36.9 C)   Resp 20   Ht 5\' 1"  (1.549 m)   Wt 276 lb (125.2 kg)   SpO2 100%   BMI 52.15 kg/m   Physical Exam  Constitutional: She is oriented to person, place, and time. She appears well-developed and well-nourished. No distress.  Obese female.  HENT:  Head: Normocephalic and atraumatic.  Nose: Nose normal.  Mouth/Throat: Oropharynx is clear and moist. No oropharyngeal exudate.  Eyes: Conjunctivae and EOM are normal. Pupils are equal, round, and reactive to light. No scleral icterus.  Neck: Normal range of motion. Neck supple. No JVD present. No tracheal deviation present. No thyromegaly present.  Cardiovascular: Normal rate, regular rhythm and normal heart sounds.  Exam reveals no gallop and no friction rub.   No murmur heard. Pulmonary/Chest: Effort  normal and breath sounds normal. No respiratory distress. She has no wheezes. She exhibits no tenderness.  Abdominal: Soft. Bowel sounds are normal. She exhibits no distension and no mass. There is no tenderness. There is no rebound and no guarding.  Musculoskeletal: Normal range of motion. She exhibits no edema or tenderness.  Lymphadenopathy:    She has no cervical adenopathy.  Neurological: She is alert and oriented to person, place, and time. No cranial nerve deficit. She exhibits normal muscle tone.  Skin: Skin is warm and  dry. No rash noted. No erythema. No pallor.  Nursing note and vitals reviewed.  ED Treatments / Results  DIAGNOSTIC STUDIES: Oxygen Saturation is 100% on RA, normal by my interpretation.   COORDINATION OF CARE: 1:32 AM-Discussed next steps with pt. Pt verbalized understanding and is agreeable with the plan.   Labs (all labs ordered are listed, but only abnormal results are displayed) Labs Reviewed  COMPREHENSIVE METABOLIC PANEL - Abnormal; Notable for the following:       Result Value   Glucose, Bld 100 (*)    Creatinine, Ser 1.07 (*)    Total Protein 6.0 (*)    Albumin 3.1 (*)    Anion gap 4 (*)    All other components within normal limits  URINALYSIS, ROUTINE W REFLEX MICROSCOPIC (NOT AT [facility]) - Abnormal; Notable for the following:    Color, Urine AMBER (*)    APPearance CLOUDY (*)    Specific Gravity, Urine 1.034 (*)    Bilirubin Urine SMALL (*)    Ketones, ur 15 (*)    All other components within normal limits  LIPASE, BLOOD  CBC   Radiology No results found.  Procedures Procedures  Medications Ordered in ED Medications - No data to display   Initial Impression / Assessment and Plan / ED Course  I have reviewed the triage vital signs and the nursing notes.  Pertinent labs & imaging results that were available during my care of the patient were reviewed by me and considered in my medical decision making (see chart for details).  Clinical Course    Patient presents to the ED for abdominal pain.  She states it feels similar to previous gastritis pains. She also has sick contacts with her 4 children.  Will treat symptomatically with ranitidine, loperamide and IVF.  Labs and US unremarkable.  Will continue to reassess.  2:36 AM Patient states she feels better.  Advised to continue gastritis medications and see her PCP within 3 days for close follow up.  Return precautions given.  She appears well and inNAD.  VS remain within her normal limits and she is safe for  DC.  Final Clinical Impressions(s) / ED Diagnoses   Final diagnoses:  None    New Prescriptions New Prescriptions   No medications on file   I personally performed the services described in this documentation, which was scribed in my presence. The recorded information has been reviewed and is accurate.       Everlene Balls, MD 02/02/16 [phone]

## 2016-02-02 NOTE — ED Notes (Signed)
MD at bedside. 

## 2016-08-08 ENCOUNTER — Encounter (HOSPITAL_COMMUNITY): Payer: Self-pay | Admitting: Emergency Medicine

## 2016-08-08 ENCOUNTER — Emergency Department (HOSPITAL_COMMUNITY)
Admission: EM | Admit: 2016-08-08 | Discharge: 2016-08-08 | Disposition: A | Discharge status: Home / Self Care | Payer: Medicaid Other | Attending: Emergency Medicine | Admitting: Emergency Medicine

## 2016-08-08 ENCOUNTER — Emergency Department (HOSPITAL_COMMUNITY): Payer: Medicaid Other

## 2016-08-08 DIAGNOSIS — F17210 Nicotine dependence, cigarettes, uncomplicated: Secondary | ICD-10-CM | POA: Insufficient documentation

## 2016-08-08 DIAGNOSIS — I10 Essential (primary) hypertension: Secondary | ICD-10-CM | POA: Insufficient documentation

## 2016-08-08 DIAGNOSIS — R69 Illness, unspecified: Secondary | ICD-10-CM

## 2016-08-08 DIAGNOSIS — R509 Fever, unspecified: Secondary | ICD-10-CM | POA: Diagnosis present

## 2016-08-08 DIAGNOSIS — Z79899 Other long term (current) drug therapy: Secondary | ICD-10-CM | POA: Insufficient documentation

## 2016-08-08 DIAGNOSIS — J111 Influenza due to unidentified influenza virus with other respiratory manifestations: Secondary | ICD-10-CM | POA: Diagnosis not present

## 2016-08-08 HISTORY — DX: Essential (primary) hypertension: I10

## 2016-08-08 LAB — COMPREHENSIVE METABOLIC PANEL
ALT: 23 U/L (ref 14–54)
AST: 24 U/L (ref 15–41)
Albumin: 3.1 g/dL — ABNORMAL LOW (ref 3.5–5.0)
Alkaline Phosphatase: 46 U/L (ref 38–126)
Anion gap: 6 (ref 5–15)
BUN: 5 mg/dL — ABNORMAL LOW (ref 6–20)
CO2: 26 mmol/L (ref 22–32)
Calcium: 8.5 mg/dL — ABNORMAL LOW (ref 8.9–10.3)
Chloride: 104 mmol/L (ref 101–111)
Creatinine, Ser: 1.1 mg/dL — ABNORMAL HIGH (ref 0.44–1.00)
GFR calc Af Amer: >60 mL/min (ref >60)
GFR calc non Af Amer: >60 mL/min (ref >60)
Glucose, Bld: 106 mg/dL — ABNORMAL HIGH (ref 65–99)
Potassium: 3.9 mmol/L (ref 3.5–5.1)
Sodium: 136 mmol/L (ref 135–145)
Total Bilirubin: 0.3 mg/dL (ref 0.3–1.2)
Total Protein: 6 g/dL — ABNORMAL LOW (ref 6.5–8.1)

## 2016-08-08 LAB — CBC WITH DIFFERENTIAL/PLATELET
Basophils Absolute: 0 10*3/uL (ref 0.0–0.1)
Basophils Relative: 0 %
Eosinophils Absolute: 0 10*3/uL (ref 0.0–0.7)
Eosinophils Relative: 0 %
HCT: 38 % (ref 36.0–46.0)
Hemoglobin: 12.6 g/dL (ref 12.0–15.0)
Lymphocytes Relative: 18 %
Lymphs Abs: 0.8 10*3/uL (ref 0.7–4.0)
MCH: 30.9 pg (ref 26.0–34.0)
MCHC: 33.2 g/dL (ref 30.0–36.0)
MCV: 93.1 fL (ref 78.0–100.0)
Monocytes Absolute: 0.8 10*3/uL (ref 0.1–1.0)
Monocytes Relative: 17 %
Neutro Abs: 2.9 10*3/uL (ref 1.7–7.7)
Neutrophils Relative %: 65 %
Platelets: 295 10*3/uL (ref 150–400)
RBC: 4.08 MIL/uL (ref 3.87–5.11)
RDW: 13.9 % (ref 11.5–15.5)
WBC: 4.5 10*3/uL (ref 4.0–10.5)

## 2016-08-08 LAB — I-STAT CG4 LACTIC ACID, ED: Lactic Acid, Venous: 0.85 mmol/L (ref 0.5–1.9)

## 2016-08-08 MED ORDER — ACETAMINOPHEN 325 MG PO TABS
ORAL_TABLET | ORAL | Status: AC
  Filled 2016-08-08: qty 2

## 2016-08-08 MED ORDER — OSELTAMIVIR PHOSPHATE 75 MG PO CAPS: 75.0000 mg | ORAL_CAPSULE | Freq: Two times a day (BID) | ORAL | 0 refills | Status: DC

## 2016-08-08 MED ORDER — ACETAMINOPHEN 325 MG PO TABS
650.0000 mg | ORAL_TABLET | Freq: Once | ORAL | Status: AC | PRN
  Administered 2016-08-08: 650 mg via ORAL

## 2016-08-08 NOTE — ED Triage Notes (Signed)
Pt c/o flu like symptoms, bad cough, swelling in her face, fever, sob, body aching all over. Pt HR 120, temp 102.4.

## 2016-08-08 NOTE — ED Notes (Signed)
Patient to XR

## 2016-08-08 NOTE — ED Provider Notes (Signed)
[facility] DEPT Provider Note   CSN: [id number] Arrival date & time: 08/08/16  1248  By signing my name below, I, Evelene Croon, attest that this documentation has been prepared under the direction and in the presence of Pattricia Boss, MD . Electronically Signed: Evelene Croon, Scribe. 08/08/2016. 2:14 PM.  History   Chief Complaint Chief Complaint  Patient presents with  . Fever  . Influenza    The history is provided by the patient. No language interpreter was used.     HPI Comments:  Lisa Haley is a 41 y.o. female who presents to the Emergency Department complaining of a persistent  cough x 3 days. She reports associated CP secondary to cough, generalized body aches since yesterday, and fever. She has been taking tylenol with mild relief. She is tolerating fluids. No flu shot received this season. No vomiting. Pt is a current smoker.   Past Medical History:  Diagnosis Date  . Hypertension   . Obesity     There are no active problems to display for this patient.   Past Surgical History:  Procedure Laterality Date  . ABDOMINAL HYSTERECTOMY    . TUBAL LIGATION      OB History    No data available       Home Medications    Prior to Admission medications   Medication Sig Start Date End Date Taking? Authorizing Provider  cyclobenzaprine (FLEXERIL) 10 MG tablet Take 10 mg by mouth 3 (three) times daily as needed for muscle spasms.    Historical Provider, MD  hydrochlorothiazide (HYDRODIURIL) 25 MG tablet Take 12.5 mg by mouth daily.    Historical Provider, MD  meloxicam (MOBIC) 15 MG tablet Take 15 mg by mouth daily.    Historical Provider, MD  oseltamivir (TAMIFLU) 75 MG capsule Take 1 capsule (75 mg total) by mouth every 12 (twelve) hours. 08/08/16   Pattricia Boss, MD  oxyCODONE-acetaminophen (PERCOCET/ROXICET) 5-325 MG tablet Take 1-2 tablets by mouth every 4 (four) hours as needed for severe pain.    Historical Provider, MD    Family History No family  history on file.  Social History Social History  Substance Use Topics  . Smoking status: Current Every Day Smoker    Packs/day: 0.50    Types: Cigarettes  . Smokeless tobacco: Never Used  . Alcohol use Yes     Comment: once a week     Allergies   No known allergies   Review of Systems Review of Systems  Constitutional: Positive for fever.  Respiratory: Positive for cough.   Cardiovascular: Positive for chest pain.  Gastrointestinal: Negative for vomiting.  Musculoskeletal: Positive for myalgias.  All other systems reviewed and are negative.  Physical Exam Updated Vital Signs BP 123/93   Pulse 120   Temp 100.4 F (38 C) (Oral)   Resp 25   SpO2 100%   Physical Exam  Constitutional: She is oriented to person, place, and time. She appears well-developed and well-nourished. No distress.  HENT:  Head: Normocephalic and atraumatic.  Right Ear: External ear normal.  Left Ear: External ear normal.  Nose: Nose normal.  Mouth/Throat: Oropharynx is clear and moist.  Eyes: Conjunctivae and EOM are normal. Pupils are equal, round, and reactive to light.  Neck: Normal range of motion. Neck supple.  Cardiovascular: Regular rhythm and normal heart sounds.   98 bpm  Pulmonary/Chest: Effort normal. No respiratory distress. She has no wheezes.  Abdominal: Soft. Bowel sounds are normal. She exhibits no distension.  Musculoskeletal: Normal  range of motion.  Neurological: She is alert and oriented to person, place, and time.  Skin: Skin is warm and dry. Capillary refill takes less than 2 seconds.  Psychiatric: She has a normal mood and affect.  Nursing note and vitals reviewed.    ED Treatments / Results  DIAGNOSTIC STUDIES:  Oxygen Saturation is 100% on RA, normal by my interpretation.    COORDINATION OF CARE:  2:14 PM Discussed treatment plan with pt at bedside and pt agreed to plan.  Labs (all labs ordered are listed, but only abnormal results are displayed) Labs  Reviewed  COMPREHENSIVE METABOLIC PANEL - Abnormal; Notable for the following:       Result Value   Glucose, Bld 106 (*)    BUN 5 (*)    Creatinine, Ser 1.10 (*)    Calcium 8.5 (*)    Total Protein 6.0 (*)    Albumin 3.1 (*)    All other components within normal limits  CBC WITH DIFFERENTIAL/PLATELET  URINALYSIS, ROUTINE W REFLEX MICROSCOPIC  I-STAT CG4 LACTIC ACID, ED    EKG  EKG Interpretation None       Radiology Dg Chest 2 View  Result Date: 08/08/2016 CLINICAL DATA:  Dry cough fever. EXAM: CHEST  2 VIEW COMPARISON:  None. FINDINGS: Heart is normal size. Minimal bibasilar densities, likely atelectasis. No effusions. No acute bony abnormality. IMPRESSION: Minimal bibasilar atelectasis. Electronically Signed   By: Rolm Baptise M.D.   On: 08/08/2016 13:54    Procedures Procedures (including critical care time)  Medications Ordered in ED Medications  acetaminophen (TYLENOL) 325 MG tablet (not administered)  acetaminophen (TYLENOL) tablet 650 mg (650 mg Oral Given 08/08/16 1310)     Initial Impression / Assessment and Plan / ED Course  I have reviewed the triage vital signs and the nursing notes.  Pertinent labs & imaging results that were available during my care of the patient were reviewed by me and considered in my medical decision making (see chart for details).       Final Clinical Impressions(s) / ED Diagnoses   Final diagnoses:  Influenza-like illness    New Prescriptions New Prescriptions   OSELTAMIVIR (TAMIFLU) 75 MG CAPSULE    Take 1 capsule (75 mg total) by mouth every 12 (twelve) hours.   I personally performed the services described in this documentation, which was scribed in my presence. The recorded information has been reviewed and considered.    Pattricia Boss, MD 08/09/16 1450

## 2018-09-23 ENCOUNTER — Other Ambulatory Visit: Payer: Self-pay

## 2018-09-23 ENCOUNTER — Emergency Department (HOSPITAL_COMMUNITY)
Admission: EM | Admit: 2018-09-23 | Discharge: 2018-09-23 | Disposition: A | Discharge status: Home / Self Care | Payer: Self-pay | Attending: Emergency Medicine | Admitting: Emergency Medicine

## 2018-09-23 ENCOUNTER — Encounter (HOSPITAL_COMMUNITY): Payer: Self-pay

## 2018-09-23 DIAGNOSIS — I10 Essential (primary) hypertension: Secondary | ICD-10-CM | POA: Insufficient documentation

## 2018-09-23 DIAGNOSIS — F17210 Nicotine dependence, cigarettes, uncomplicated: Secondary | ICD-10-CM | POA: Insufficient documentation

## 2018-09-23 DIAGNOSIS — J069 Acute upper respiratory infection, unspecified: Secondary | ICD-10-CM | POA: Insufficient documentation

## 2018-09-23 DIAGNOSIS — Z79899 Other long term (current) drug therapy: Secondary | ICD-10-CM | POA: Insufficient documentation

## 2018-09-23 MED ORDER — ALBUTEROL SULFATE HFA 108 (90 BASE) MCG/ACT IN AERS
2.0000 | INHALATION_SPRAY | RESPIRATORY_TRACT | Status: DC | PRN
  Administered 2018-09-23: 2 via RESPIRATORY_TRACT
  Filled 2018-09-23: qty 6.7

## 2018-09-23 NOTE — ED Provider Notes (Signed)
[facility] EMERGENCY DEPARTMENT Provider Note   CSN: [id number] Arrival date & time: 09/23/18  1422    History   Chief Complaint Chief Complaint  Patient presents with  . URI    HPI Lisa Haley is a 43 y.o. female.     HPI  43 year old female presents today complaining of one-week history of nasal congestion, cough which is nonproductive, and some decreased energy.  She went to work today and was told to come to the ED for evaluation.  Denies fever or chills.  She has had decreased appetite but is drinking fluids and is urinating.  She has not been lightheaded.  She has history of hypertension and obesity but denies diabetes.  She has not traveled outside the country or had known covid 19 contacts.  She denies dyspnea.  Past Medical History:  Diagnosis Date  . Hypertension   . Obesity     There are no active problems to display for this patient.   Past Surgical History:  Procedure Laterality Date  . ABDOMINAL HYSTERECTOMY    . TUBAL LIGATION       OB History   No obstetric history on file.      Home Medications    Prior to Admission medications   Medication Sig Start Date End Date Taking? Authorizing Provider  cyclobenzaprine (FLEXERIL) 10 MG tablet Take 10 mg by mouth 3 (three) times daily as needed for muscle spasms.    [provider]  hydrochlorothiazide (HYDRODIURIL) 25 MG tablet Take 12.5 mg by mouth daily.    [provider]  meloxicam (MOBIC) 15 MG tablet Take 15 mg by mouth daily.    [provider]  oseltamivir (TAMIFLU) 75 MG capsule Take 1 capsule (75 mg total) by mouth every 12 (twelve) hours. 08/08/16   Pattricia Boss, MD  oxyCODONE-acetaminophen (PERCOCET/ROXICET) 5-325 MG tablet Take 1-2 tablets by mouth every 4 (four) hours as needed for severe pain.    [provider]    Family History History reviewed. No pertinent family history.  Social History Social History   Tobacco Use   . Smoking status: Current Every Day Smoker    Packs/day: 0.50    Types: Cigarettes  . Smokeless tobacco: Never Used  Substance Use Topics  . Alcohol use: Yes    Comment: once a week  . Drug use: No     Allergies   Patient has no known allergies.   Review of Systems Review of Systems  All other systems reviewed and are negative.    Physical Exam Updated Vital Signs BP (!) 162/111 (BP Location: Right Arm)   Pulse 80   Temp 98 F (36.7 C) (Oral)   Resp 18   Ht 1.575 m (5\' 2" )   Wt 129.3 kg   SpO2 99%   BMI 52.13 kg/m   Physical Exam Vitals signs and nursing note reviewed.  Constitutional:      General: She is in acute distress.     Appearance: She is normal weight. She is not ill-appearing.  HENT:     Head: Normocephalic.     Right Ear: Tympanic membrane and external ear normal.     Left Ear: Tympanic membrane and external ear normal.     Nose: Nose normal.     Mouth/Throat:     Mouth: Mucous membranes are moist.     Pharynx: Oropharynx is clear.  Eyes:     Pupils: Pupils are equal, round, and reactive to light.  Neck:  Musculoskeletal: Normal range of motion.  Cardiovascular:     Rate and Rhythm: Normal rate and regular rhythm.     Pulses: Normal pulses.     Heart sounds: Normal heart sounds.  Pulmonary:     Effort: Pulmonary effort is normal.  Abdominal:     General: Abdomen is flat.  Musculoskeletal: Normal range of motion.  Skin:    General: Skin is warm.     Capillary Refill: Capillary refill takes less than 2 seconds.  Neurological:     General: No focal deficit present.     Mental Status: She is alert.  Psychiatric:        Mood and Affect: Mood normal.      ED Treatments / Results  Labs (all labs ordered are listed, but only abnormal results are displayed) Labs Reviewed - No data to display  EKG None  Radiology No results found.  Procedures Procedures (including critical care time)  Medications Ordered in ED Medications -  No data to display   Initial Impression / Assessment and Plan / ED Course  I have reviewed the triage vital signs and the nursing notes.  Pertinent labs & imaging results that were available during my care of the patient were reviewed by me and considered in my medical decision making (see chart for details).        This is a 43 year old female presents today with upper respiratory symptoms which have been present for 1 week.  She is sent by work.  She has no Covid19 risk factors or exposures.  She is cleared to return to work given the length of her symptoms.  I discussed return precautions and need for follow-up and she voices understanding.  Final Clinical Impressions(s) / ED Diagnoses   Final diagnoses:  Upper respiratory tract infection, unspecified type    ED Discharge Orders    None       Pattricia Boss, MD 09/23/18 [phone]

## 2018-09-23 NOTE — ED Triage Notes (Signed)
Pt arrives POV for eval of cold symptoms x 3 days. Pt reports cough/congestion, chills, sneezing and general malaise. States her "job sent her over to be checked". Pt denies known sick contacts, afebrile in triage.

## 2019-07-11 DIAGNOSIS — I639 Cerebral infarction, unspecified: Secondary | ICD-10-CM

## 2019-07-11 HISTORY — DX: Cerebral infarction, unspecified: I63.9

## 2020-07-06 ENCOUNTER — Inpatient Hospital Stay (HOSPITAL_COMMUNITY): Payer: Self-pay

## 2020-07-06 ENCOUNTER — Inpatient Hospital Stay (HOSPITAL_COMMUNITY): Payer: Self-pay | Admitting: Certified Registered Nurse Anesthetist

## 2020-07-06 ENCOUNTER — Inpatient Hospital Stay (HOSPITAL_COMMUNITY)
Admission: EM | Admit: 2020-07-06 | Discharge: 2020-07-13 | DRG: 065 | Disposition: A | Discharge status: Home / Self Care | Payer: Self-pay | Attending: Neurosurgery | Admitting: Neurosurgery

## 2020-07-06 ENCOUNTER — Emergency Department (HOSPITAL_COMMUNITY): Payer: Self-pay

## 2020-07-06 ENCOUNTER — Encounter (HOSPITAL_COMMUNITY)
Admission: EM | Disposition: A | Discharge status: Home / Self Care | Payer: Self-pay | Source: Home / Self Care | Attending: Neurosurgery

## 2020-07-06 ENCOUNTER — Other Ambulatory Visit: Payer: Self-pay

## 2020-07-06 ENCOUNTER — Encounter (HOSPITAL_COMMUNITY): Payer: Self-pay | Admitting: *Deleted

## 2020-07-06 ENCOUNTER — Other Ambulatory Visit (HOSPITAL_COMMUNITY): Payer: Medicaid Other

## 2020-07-06 DIAGNOSIS — I10 Essential (primary) hypertension: Secondary | ICD-10-CM | POA: Diagnosis present

## 2020-07-06 DIAGNOSIS — I609 Nontraumatic subarachnoid hemorrhage, unspecified: Principal | ICD-10-CM | POA: Diagnosis present

## 2020-07-06 DIAGNOSIS — Z20822 Contact with and (suspected) exposure to covid-19: Secondary | ICD-10-CM | POA: Diagnosis present

## 2020-07-06 DIAGNOSIS — Z79899 Other long term (current) drug therapy: Secondary | ICD-10-CM

## 2020-07-06 DIAGNOSIS — Z6841 Body Mass Index (BMI) 40.0 and over, adult: Secondary | ICD-10-CM

## 2020-07-06 DIAGNOSIS — I729 Aneurysm of unspecified site: Secondary | ICD-10-CM

## 2020-07-06 DIAGNOSIS — F17210 Nicotine dependence, cigarettes, uncomplicated: Secondary | ICD-10-CM | POA: Diagnosis present

## 2020-07-06 DIAGNOSIS — Z90710 Acquired absence of both cervix and uterus: Secondary | ICD-10-CM

## 2020-07-06 HISTORY — PX: IR ANGIO INTRA EXTRACRAN SEL INTERNAL CAROTID BILAT MOD SED: IMG5363

## 2020-07-06 HISTORY — PX: IR ANGIO VERTEBRAL SEL VERTEBRAL BILAT MOD SED: IMG5369

## 2020-07-06 HISTORY — PX: RADIOLOGY WITH ANESTHESIA: SHX6223

## 2020-07-06 LAB — I-STAT CHEM 8, ED
BUN: 14 mg/dL (ref 6–20)
Calcium, Ion: 1.13 mmol/L — ABNORMAL LOW (ref 1.15–1.40)
Chloride: 103 mmol/L (ref 98–111)
Creatinine, Ser: 0.8 mg/dL (ref 0.44–1.00)
Glucose, Bld: 223 mg/dL — ABNORMAL HIGH (ref 70–99)
HCT: 46 % (ref 36.0–46.0)
Hemoglobin: 15.6 g/dL — ABNORMAL HIGH (ref 12.0–15.0)
Potassium: 3.8 mmol/L (ref 3.5–5.1)
Sodium: 137 mmol/L (ref 135–145)
TCO2: 21 mmol/L — ABNORMAL LOW (ref 22–32)

## 2020-07-06 LAB — COMPREHENSIVE METABOLIC PANEL WITH GFR
ALT: 25 U/L (ref 0–44)
AST: 32 U/L (ref 15–41)
Albumin: 3.2 g/dL — ABNORMAL LOW (ref 3.5–5.0)
Alkaline Phosphatase: 61 U/L (ref 38–126)
Anion gap: 12 (ref 5–15)
BUN: 13 mg/dL (ref 6–20)
CO2: 19 mmol/L — ABNORMAL LOW (ref 22–32)
Calcium: 8.5 mg/dL — ABNORMAL LOW (ref 8.9–10.3)
Chloride: 103 mmol/L (ref 98–111)
Creatinine, Ser: 1.05 mg/dL — ABNORMAL HIGH (ref 0.44–1.00)
GFR, Estimated: >60 mL/min
Glucose, Bld: 249 mg/dL — ABNORMAL HIGH (ref 70–99)
Potassium: 3.6 mmol/L (ref 3.5–5.1)
Sodium: 134 mmol/L — ABNORMAL LOW (ref 135–145)
Total Bilirubin: 0.4 mg/dL (ref 0.3–1.2)
Total Protein: 6.1 g/dL — ABNORMAL LOW (ref 6.5–8.1)

## 2020-07-06 LAB — CBC WITH DIFFERENTIAL/PLATELET
Abs Immature Granulocytes: 0.11 10*3/uL — ABNORMAL HIGH (ref 0.00–0.07)
Basophils Absolute: 0.1 10*3/uL (ref 0.0–0.1)
Basophils Relative: 1 %
Eosinophils Absolute: 0.3 10*3/uL (ref 0.0–0.5)
Eosinophils Relative: 2 %
HCT: 41 % (ref 36.0–46.0)
Hemoglobin: 13.1 g/dL (ref 12.0–15.0)
Immature Granulocytes: 1 %
Lymphocytes Relative: 30 %
Lymphs Abs: 3.8 10*3/uL (ref 0.7–4.0)
MCH: 31.2 pg (ref 26.0–34.0)
MCHC: 32 g/dL (ref 30.0–36.0)
MCV: 97.6 fL (ref 80.0–100.0)
Monocytes Absolute: 0.9 10*3/uL (ref 0.1–1.0)
Monocytes Relative: 7 %
Neutro Abs: 7.7 10*3/uL (ref 1.7–7.7)
Neutrophils Relative %: 59 %
Platelets: 376 10*3/uL (ref 150–400)
RBC: 4.2 MIL/uL (ref 3.87–5.11)
RDW: 14 % (ref 11.5–15.5)
WBC: 12.9 10*3/uL — ABNORMAL HIGH (ref 4.0–10.5)
nRBC: 0 % (ref 0.0–0.2)

## 2020-07-06 LAB — RAPID URINE DRUG SCREEN, HOSP PERFORMED
Amphetamines: NOT DETECTED
Barbiturates: NOT DETECTED
Benzodiazepines: NOT DETECTED
Cocaine: POSITIVE — AB
Opiates: NOT DETECTED
Tetrahydrocannabinol: POSITIVE — AB

## 2020-07-06 LAB — TYPE AND SCREEN
ABO/RH(D): A POS
Antibody Screen: NEGATIVE

## 2020-07-06 LAB — RESP PANEL BY RT-PCR (FLU A&B, COVID) ARPGX2
Influenza A by PCR: NEGATIVE
Influenza B by PCR: NEGATIVE
SARS Coronavirus 2 by RT PCR: NEGATIVE

## 2020-07-06 LAB — APTT: aPTT: 27 seconds (ref 24–36)

## 2020-07-06 LAB — ABO/RH: ABO/RH(D): A POS

## 2020-07-06 LAB — MRSA PCR SCREENING: MRSA by PCR: NEGATIVE

## 2020-07-06 LAB — PROTIME-INR
INR: 1 (ref 0.8–1.2)
Prothrombin Time: 12.6 seconds (ref 11.4–15.2)

## 2020-07-06 LAB — ETHANOL: Alcohol, Ethyl (B): <10 mg/dL (ref <10)

## 2020-07-06 SURGERY — IR WITH ANESTHESIA
Anesthesia: General

## 2020-07-06 MED ORDER — LABETALOL HCL 5 MG/ML IV SOLN: 20.0000 mg | Freq: Once | INTRAVENOUS | Status: DC

## 2020-07-06 MED ORDER — LEVETIRACETAM IN NACL 500 MG/100ML IV SOLN
500.0000 mg | Freq: Two times a day (BID) | INTRAVENOUS | Status: DC
  Administered 2020-07-06 – 2020-07-13 (×14): 500 mg via INTRAVENOUS
  Filled 2020-07-06 (×14): qty 100

## 2020-07-06 MED ORDER — ACETAMINOPHEN 500 MG PO TABS
1000.0000 mg | ORAL_TABLET | Freq: Once | ORAL | Status: AC
  Administered 2020-07-06: 1000 mg via ORAL
  Filled 2020-07-06: qty 2

## 2020-07-06 MED ORDER — PANTOPRAZOLE SODIUM 40 MG PO TBEC
40.0000 mg | DELAYED_RELEASE_TABLET | Freq: Every day | ORAL | Status: DC
  Administered 2020-07-07 – 2020-07-13 (×7): 40 mg via ORAL
  Filled 2020-07-06 (×7): qty 1

## 2020-07-06 MED ORDER — ACETAMINOPHEN 650 MG RE SUPP: 650.0000 mg | RECTAL | Status: DC | PRN

## 2020-07-06 MED ORDER — LORAZEPAM 2 MG/ML IJ SOLN: 1.0000 mg | INTRAMUSCULAR | Status: AC | PRN

## 2020-07-06 MED ORDER — CHLORHEXIDINE GLUCONATE CLOTH 2 % EX PADS
6.0000 | MEDICATED_PAD | Freq: Every day | CUTANEOUS | Status: DC
  Administered 2020-07-07 – 2020-07-11 (×5): 6 via TOPICAL

## 2020-07-06 MED ORDER — DEXAMETHASONE SODIUM PHOSPHATE 10 MG/ML IJ SOLN
INTRAMUSCULAR | Status: DC | PRN
  Administered 2020-07-06: 5 mg via INTRAVENOUS

## 2020-07-06 MED ORDER — CHLORHEXIDINE GLUCONATE 0.12 % MT SOLN
15.0000 mL | Freq: Once | OROMUCOSAL | Status: AC
  Administered 2020-07-06: 15 mL via OROMUCOSAL

## 2020-07-06 MED ORDER — STROKE: EARLY STAGES OF RECOVERY BOOK: Freq: Once | Status: AC

## 2020-07-06 MED ORDER — NICARDIPINE HCL IN NACL 20-0.86 MG/200ML-% IV SOLN
0.0000 mg/h | INTRAVENOUS | Status: DC
  Administered 2020-07-06 (×2): 15 mg/h via INTRAVENOUS
  Administered 2020-07-07: 12 mg/h via INTRAVENOUS
  Administered 2020-07-07: 5 mg/h via INTRAVENOUS
  Administered 2020-07-07 (×2): 12 mg/h via INTRAVENOUS
  Administered 2020-07-08: 10 mg/h via INTRAVENOUS
  Administered 2020-07-08: 8 mg/h via INTRAVENOUS
  Administered 2020-07-08 – 2020-07-09 (×2): 10 mg/h via INTRAVENOUS
  Administered 2020-07-09: 7.5 mg/h via INTRAVENOUS
  Administered 2020-07-09 (×6): 10 mg/h via INTRAVENOUS
  Administered 2020-07-10: 7.5 mg/h via INTRAVENOUS
  Administered 2020-07-10: 5 mg/h via INTRAVENOUS
  Administered 2020-07-10 (×2): 7.5 mg/h via INTRAVENOUS
  Administered 2020-07-10 (×2): 10 mg/h via INTRAVENOUS
  Administered 2020-07-11 (×3): 15 mg/h via INTRAVENOUS
  Administered 2020-07-11: 10 mg/h via INTRAVENOUS
  Administered 2020-07-11 – 2020-07-12 (×8): 15 mg/h via INTRAVENOUS
  Administered 2020-07-12: 10 mg/h via INTRAVENOUS
  Administered 2020-07-12 (×2): 15 mg/h via INTRAVENOUS
  Administered 2020-07-12: 7.5 mg/h via INTRAVENOUS
  Administered 2020-07-12 (×3): 15 mg/h via INTRAVENOUS
  Administered 2020-07-12: 10 mg/h via INTRAVENOUS
  Administered 2020-07-12: 12.5 mg/h via INTRAVENOUS
  Administered 2020-07-13: 7.5 mg/h via INTRAVENOUS
  Administered 2020-07-13: 12.5 mg/h via INTRAVENOUS
  Filled 2020-07-06 (×2): qty 400
  Filled 2020-07-06 (×2): qty 200
  Filled 2020-07-06: qty 400
  Filled 2020-07-06: qty 200
  Filled 2020-07-06: qty 600
  Filled 2020-07-06 (×4): qty 200
  Filled 2020-07-06 (×2): qty 400
  Filled 2020-07-06: qty 200
  Filled 2020-07-06: qty 400
  Filled 2020-07-06: qty 200
  Filled 2020-07-06: qty 400
  Filled 2020-07-06: qty 200
  Filled 2020-07-06: qty 400
  Filled 2020-07-06: qty 200
  Filled 2020-07-06: qty 400
  Filled 2020-07-06 (×3): qty 200
  Filled 2020-07-06 (×2): qty 400
  Filled 2020-07-06 (×3): qty 200
  Filled 2020-07-06: qty 400
  Filled 2020-07-06 (×2): qty 200
  Filled 2020-07-06: qty 600
  Filled 2020-07-06 (×6): qty 200

## 2020-07-06 MED ORDER — NICARDIPINE HCL IN NACL 20-0.86 MG/200ML-% IV SOLN
INTRAVENOUS | Status: AC
  Filled 2020-07-06: qty 200

## 2020-07-06 MED ORDER — NICARDIPINE HCL IN NACL 20-0.86 MG/200ML-% IV SOLN
0.0000 mg/h | INTRAVENOUS | Status: DC
  Administered 2020-07-06: 5 mg/h via INTRAVENOUS
  Administered 2020-07-06: 12.5 mg/h via INTRAVENOUS
  Filled 2020-07-06 (×2): qty 200

## 2020-07-06 MED ORDER — DOCUSATE SODIUM 100 MG PO CAPS
100.0000 mg | ORAL_CAPSULE | Freq: Two times a day (BID) | ORAL | Status: DC
  Administered 2020-07-07 – 2020-07-13 (×11): 100 mg via ORAL
  Filled 2020-07-06 (×10): qty 1

## 2020-07-06 MED ORDER — LEVETIRACETAM IN NACL 1000 MG/100ML IV SOLN
1000.0000 mg | Freq: Once | INTRAVENOUS | Status: AC
  Administered 2020-07-06: 1000 mg via INTRAVENOUS
  Filled 2020-07-06: qty 100

## 2020-07-06 MED ORDER — IOHEXOL 350 MG/ML SOLN
80.0000 mL | Freq: Once | INTRAVENOUS | Status: AC | PRN
  Administered 2020-07-06: 80 mL via INTRAVENOUS

## 2020-07-06 MED ORDER — SODIUM CHLORIDE 0.9 % IV SOLN: INTRAVENOUS | Status: DC

## 2020-07-06 MED ORDER — FOLIC ACID 1 MG PO TABS
1.0000 mg | ORAL_TABLET | Freq: Every day | ORAL | Status: DC
  Administered 2020-07-07 – 2020-07-13 (×7): 1 mg via ORAL
  Filled 2020-07-06 (×7): qty 1

## 2020-07-06 MED ORDER — LORAZEPAM 1 MG PO TABS
1.0000 mg | ORAL_TABLET | ORAL | Status: AC | PRN
  Administered 2020-07-08 – 2020-07-09 (×2): 1 mg via ORAL
  Filled 2020-07-06 (×2): qty 1

## 2020-07-06 MED ORDER — NIMODIPINE 30 MG PO CAPS
60.0000 mg | ORAL_CAPSULE | ORAL | Status: DC
  Administered 2020-07-06 – 2020-07-13 (×42): 60 mg via ORAL
  Filled 2020-07-06 (×42): qty 2

## 2020-07-06 MED ORDER — ONDANSETRON HCL 4 MG/2ML IJ SOLN
INTRAMUSCULAR | Status: DC | PRN
  Administered 2020-07-06: 4 mg via INTRAVENOUS

## 2020-07-06 MED ORDER — NIMODIPINE 6 MG/ML PO SOLN: 60.0000 mg | ORAL | Status: DC

## 2020-07-06 MED ORDER — ACETAMINOPHEN-CODEINE #3 300-30 MG PO TABS
1.0000 | ORAL_TABLET | ORAL | Status: DC | PRN
  Administered 2020-07-06 – 2020-07-09 (×12): 2 via ORAL
  Administered 2020-07-09: 1 via ORAL
  Administered 2020-07-09: 2 via ORAL
  Administered 2020-07-10 (×2): 1 via ORAL
  Administered 2020-07-10 – 2020-07-13 (×11): 2 via ORAL
  Administered 2020-07-13: 1 via ORAL
  Filled 2020-07-06 (×17): qty 2
  Filled 2020-07-06: qty 1
  Filled 2020-07-06 (×7): qty 2
  Filled 2020-07-06 (×2): qty 1
  Filled 2020-07-06 (×2): qty 2

## 2020-07-06 MED ORDER — ONDANSETRON HCL 4 MG/2ML IJ SOLN: 4.0000 mg | Freq: Four times a day (QID) | INTRAMUSCULAR | Status: DC | PRN

## 2020-07-06 MED ORDER — DEXMEDETOMIDINE (PRECEDEX) IN NS 20 MCG/5ML (4 MCG/ML) IV SYRINGE
PREFILLED_SYRINGE | INTRAVENOUS | Status: DC | PRN
  Administered 2020-07-06: 8 ug via INTRAVENOUS

## 2020-07-06 MED ORDER — HYDROMORPHONE HCL 1 MG/ML IJ SOLN
1.0000 mg | INTRAMUSCULAR | Status: DC | PRN
  Administered 2020-07-06 – 2020-07-13 (×43): 1 mg via INTRAVENOUS
  Filled 2020-07-06 (×47): qty 1

## 2020-07-06 MED ORDER — FENTANYL CITRATE (PF) 250 MCG/5ML IJ SOLN
INTRAMUSCULAR | Status: DC | PRN
  Administered 2020-07-06 (×2): 50 ug via INTRAVENOUS

## 2020-07-06 MED ORDER — MIDAZOLAM HCL 2 MG/2ML IJ SOLN
INTRAMUSCULAR | Status: DC | PRN
  Administered 2020-07-06: 2 mg via INTRAVENOUS

## 2020-07-06 MED ORDER — IOHEXOL 300 MG/ML  SOLN
150.0000 mL | Freq: Once | INTRAMUSCULAR | Status: AC | PRN
  Administered 2020-07-06: 50 mL via INTRA_ARTERIAL

## 2020-07-06 MED ORDER — PANTOPRAZOLE SODIUM 40 MG PO PACK: 40.0000 mg | PACK | Freq: Every day | ORAL | Status: DC

## 2020-07-06 MED ORDER — ACETAMINOPHEN 160 MG/5ML PO SOLN: 650.0000 mg | ORAL | Status: DC | PRN

## 2020-07-06 MED ORDER — NICARDIPINE HCL IN NACL 20-0.86 MG/200ML-% IV SOLN
INTRAVENOUS | Status: AC
  Administered 2020-07-06: 7.5 mg/h via INTRAVENOUS
  Filled 2020-07-06: qty 200

## 2020-07-06 MED ORDER — ACETAMINOPHEN 325 MG PO TABS
650.0000 mg | ORAL_TABLET | ORAL | Status: DC | PRN
  Filled 2020-07-06: qty 2

## 2020-07-06 MED ORDER — FENTANYL CITRATE (PF) 100 MCG/2ML IJ SOLN
50.0000 ug | Freq: Once | INTRAMUSCULAR | Status: AC
  Administered 2020-07-06: 50 ug via INTRAVENOUS
  Filled 2020-07-06: qty 2

## 2020-07-06 MED ORDER — LIDOCAINE HCL 1 % IJ SOLN
INTRAMUSCULAR | Status: AC
  Filled 2020-07-06: qty 20

## 2020-07-06 MED ORDER — FENTANYL CITRATE (PF) 100 MCG/2ML IJ SOLN: 12.5000 ug | INTRAMUSCULAR | Status: DC | PRN

## 2020-07-06 MED ORDER — LACTATED RINGERS IV SOLN: INTRAVENOUS | Status: DC | PRN

## 2020-07-06 MED ORDER — ONDANSETRON 4 MG PO TBDP: 4.0000 mg | ORAL_TABLET | Freq: Four times a day (QID) | ORAL | Status: DC | PRN

## 2020-07-06 NOTE — ED Notes (Signed)
Bp cuff repositioned with different size

## 2020-07-06 NOTE — ED Triage Notes (Signed)
Pt from home by EMS for seizure like activity. EMS reported call for unresponsive. Pt found lying on the ground, EMS assisted ventilations with BVM. Per family, pt had full body shaking; no previous history of seizures. Pt c/o headache, A&Ox3, unable to recall events leading up to \\event 

## 2020-07-06 NOTE — ED Notes (Signed)
Tried calling report on patient but at this time the nurse is unable to take report... they asked to have 10-15 mins. I will try again later.

## 2020-07-06 NOTE — Anesthesia Preprocedure Evaluation (Addendum)
Anesthesia Evaluation  Patient identified by MRN, date of birth, ID band Patient confused    Reviewed: Allergy & Precautions, NPO status , Patient's Chart, lab work & pertinent test results, Unable to perform ROS - Chart review only  History of Anesthesia Complications Negative for: history of anesthetic complications  Airway Mallampati: III  TM Distance: >3 FB Neck ROM: Full    Dental  (+) Dental Advisory Given   Pulmonary Current Smoker and Patient abstained from smoking.,   Pulmonary edema seen on CT    Pulmonary exam normal        Cardiovascular hypertension,  Rhythm:Regular Rate:Tachycardia     Neuro/Psych  SAH w/ concomitant seizure  CTA Head/neck - IMPRESSION: 1. Abundant subarachnoid hemorrhage but NO intracranial aneurysm is identified on this initial CTA. In the absence of a follow-up conventional cerebral angiogram, a repeat CTA Head is recommended (e.g. in 12-24 hrs). 2. Mildly irregular appearance of the supraclinoid Right ICA and Right MCA branches suspicious for vasospasm. 3. Abnormal pulmonary ground-glass opacity suspicious for Acute Pulmonary Edema. 4. Mild cervical carotid atherosclerosis without stenosis. No significant intracranial atherosclerosis.  Drowsy, but arousable to loud voice negative psych ROS   GI/Hepatic negative GI ROS, Neg liver ROS,   Endo/Other  Morbid obesity Possible undiagnosed DM   Renal/GU negative Renal ROS     Musculoskeletal negative musculoskeletal ROS (+)   Abdominal   Peds  Hematology negative hematology ROS (+)   Anesthesia Other Findings Covid test negative   Reproductive/Obstetrics  s/p hysterectomy                             Anesthesia Physical Anesthesia Plan  ASA: IV and emergent  Anesthesia Plan: MAC   Post-op Pain Management:    Induction:   PONV Risk Score and Plan: 2 and Treatment may vary due to age or  medical condition and Propofol infusion  Airway Management Planned: Natural Airway and Simple Face Mask  Additional Equipment:   Intra-op Plan:   Post-operative Plan:   Informed Consent:     History available from chart only  Plan Discussed with: CRNA and Anesthesiologist  Anesthesia Plan Comments: (May begin procedure as MAC with conversion to GA as indicated by procedure. If intubating for procedure, moderate likelihood of remaining intubated at conclusion of procedure given mental status and evidence of pulmonary edema on imaging )      Anesthesia Quick Evaluation

## 2020-07-06 NOTE — Progress Notes (Signed)
Assisted tele visit to patient with family member.

## 2020-07-06 NOTE — Sedation Documentation (Signed)
Patient transported to [facility] with CRNA- Ian Malkin. Bedside report given to [facility]. Groin site assessed. Soft to palpation. Site is clean, dry and intact. No drainage noted from site. Distal pulses intact.

## 2020-07-06 NOTE — ED Provider Notes (Signed)
[place] EMERGENCY DEPARTMENT Provider Note   CSN: [id number] Arrival date & time: 07/06/20  [id number]     History Chief Complaint  Patient presents with  . Seizures    Lisa Haley is a 44 y.o. female.  The history is provided by the patient and medical records.  Seizures   44 y.o. F with hx of HTN and obesity, presenting to the ED after questionable seizure.  Per EMS, was at home with family and acting normally then all of a sudden stopped responding, hit the floor, and appeared to have full body shaking.  There was no bowel/bladder incontinence.  Upon EMS arrival she was somnolent but breathing on her own and maintaining O2 sats in upper 90's.  She slowly came around afterwards but did have some repetitive questioning en route to ED.  No prior history of seizures.  Denies substance abuse, lack of sleep, recent illness, etc.  No new medications/supplements.  No vomiting, diarrhea, fever, chills, sweats.  Patient only complaining of headache on arrival to ED.  Past Medical History:  Diagnosis Date  . Hypertension   . Obesity     There are no problems to display for this patient.   Past Surgical History:  Procedure Laterality Date  . ABDOMINAL HYSTERECTOMY    . TUBAL LIGATION       OB History   No obstetric history on file.     No family history on file.  Social History   Tobacco Use  . Smoking status: Current Every Day Smoker    Packs/day: 0.50    Types: Cigarettes  . Smokeless tobacco: Never Used  Substance Use Topics  . Alcohol use: Yes    Comment: once a week  . Drug use: No    Home Medications Prior to Admission medications   Medication Sig Start Date End Date Taking? Authorizing Provider  cyclobenzaprine (FLEXERIL) 10 MG tablet Take 10 mg by mouth 3 (three) times daily as needed for muscle spasms.    [provider]  hydrochlorothiazide (HYDRODIURIL) 25 MG tablet Take 12.5 mg by mouth daily.    [provider]  meloxicam (MOBIC) 15 MG tablet Take 15 mg by mouth daily.    [provider]  oseltamivir (TAMIFLU) 75 MG capsule Take 1 capsule (75 mg total) by mouth every 12 (twelve) hours. 08/08/16   Pattricia Boss, MD  oxyCODONE-acetaminophen (PERCOCET/ROXICET) 5-325 MG tablet Take 1-2 tablets by mouth every 4 (four) hours as needed for severe pain.    [provider]    Allergies    Patient has no known allergies.  Review of Systems   Review of Systems  Neurological: Positive for seizures.  All other systems reviewed and are negative.   Physical Exam Updated Vital Signs Ht 5\' 2"  (1.575 m)   Wt 127.7 kg   SpO2 96%   BMI 51.49 kg/m   Physical Exam Vitals and nursing note reviewed.  Constitutional:      Appearance: She is well-developed and well-nourished.  HENT:     Head: Normocephalic and atraumatic.     Mouth/Throat:     Mouth: Oropharynx is clear and moist.     Comments: No tongue or dental injury noted Eyes:     Extraocular Movements: EOM normal.     Conjunctiva/sclera: Conjunctivae normal.     Pupils: Pupils are equal, round, and reactive to light.  Cardiovascular:     Rate and Rhythm: Normal rate and regular rhythm.  Heart sounds: Normal heart sounds.  Pulmonary:     Effort: Pulmonary effort is normal.     Breath sounds: Normal breath sounds.  Abdominal:     General: Bowel sounds are normal.     Palpations: Abdomen is soft.  Musculoskeletal:        General: Normal range of motion.     Cervical back: Normal range of motion.  Skin:    General: Skin is warm and dry.  Neurological:     Mental Status: She is alert and oriented to person, place, and time.     Comments: AAOx3, answering questions and following commands appropriately; equal strength UE and LE bilaterally; CN grossly intact; moves all extremities appropriately without ataxia; no focal neuro deficits or facial asymmetry appreciated  Psychiatric:        Mood and Affect: Mood and affect  normal.     ED Results / Procedures / Treatments   Labs (all labs ordered are listed, but only abnormal results are displayed) Labs Reviewed  CBC WITH DIFFERENTIAL/PLATELET - Abnormal; Notable for the following components:      Result Value   WBC 12.9 (*)    Abs Immature Granulocytes 0.11 (*)    All other components within normal limits  COMPREHENSIVE METABOLIC PANEL - Abnormal; Notable for the following components:   Sodium 134 (*)    CO2 19 (*)    Glucose, Bld 249 (*)    Creatinine, Ser 1.05 (*)    Calcium 8.5 (*)    Total Protein 6.1 (*)    Albumin 3.2 (*)    All other components within normal limits  RESP PANEL BY RT-PCR (FLU A&B, COVID) ARPGX2  ETHANOL  RAPID URINE DRUG SCREEN, HOSP PERFORMED  I-STAT CHEM 8, ED    EKG EKG Interpretation  Date/Time:  Tuesday July 06 2020 03:02:37 EST Ventricular Rate:  82 PR Interval:    QRS Duration: 105 QT Interval:  397 QTC Calculation: 464 R Axis:   33 Text Interpretation: Sinus rhythm Minimal ST depression, lateral leads When compared with ECG of 01/06/2017, No significant change was found Confirmed by Dione Booze ([id number]) on 07/06/2020 3:06:07 AM   Radiology CT Angio Head W or Wo Contrast  Result Date: 07/06/2020 CLINICAL DATA:  44 year old female with seizure, headache, aneurysmal pattern subarachnoid hemorrhage on head CT at 0314 hours today. EXAM: CT ANGIOGRAPHY HEAD AND NECK TECHNIQUE: Multidetector CT imaging of the head and neck was performed using the standard protocol during bolus administration of intravenous contrast. Multiplanar CT image reconstructions and MIPs were obtained to evaluate the vascular anatomy. Carotid stenosis measurements (when applicable) are obtained utilizing NASCET criteria, using the distal internal carotid diameter as the denominator. CONTRAST:  51mL OMNIPAQUE IOHEXOL 350 MG/ML SOLN COMPARISON:  Plain head CT 0314 hours today. FINDINGS: CTA NECK Skeleton: No acute osseous abnormality  identified. Upper chest: Confluent bilateral pulmonary ground-glass opacity with centrilobular pattern, slightly greater on the right. No apical pneumothorax or pleural effusion. Other neck: Negative. Aortic arch: 3 vessel arch configuration.  No arch atherosclerosis. Right carotid system: Dense right subclavian vein contrast mildly obscures the proximal right CCA. Negative right CCA. Minimal calcified plaque at the medial right ICA origin without stenosis. Mildly tortuous right ICA, partially retropharyngeal. Left carotid system: Negative left CCA. Mild to moderate calcified plaque at the posterior left ICA bulb without stenosis. Vertebral arteries: Proximal right subclavian artery appears normal. Regional dense venous contrast obscures the right vertebral artery origin and V1 segment. But the visible cervical  right vertebral artery appears patent and normal to the skull base aside from V2 segment tortuosity. Proximal left subclavian artery and left vertebral artery origin are normal. Tortuous left V1 and V2 segments. Otherwise normal left vertebral to the skull base. CTA HEAD Suboptimal intracranial contrast timing. Posterior circulation: Patent distal vertebral arteries, left mildly dominant. Normal right PICA origin. Left AICA probably is dominant. Patent vertebrobasilar junction and basilar artery without stenosis. SCA, PCA origins and basilar tip appear normal. Left posterior communicating artery is present, the right is diminutive or absent. Bilateral PCA branches are within normal limits. Anterior circulation: Both ICA siphons are patent. The left siphon is mildly tortuous with minimal calcified plaque, but no left siphon stenosis or aneurysm. Left posterior communicating artery origin appears normal. Right ICA siphon demonstrates minimal calcified plaque. No right siphon aneurysm or atherosclerotic stenosis. There is a tapered appearance of the supraclinoid right ICA which might be vasospasm. Patent carotid  termini. MCA and ACA origins are patent. The left A1 appears dominant. Anterior communicating artery appears within normal limits and there appears to be a median artery of the corpus callosum. No proximal ACA aneurysm identified. Bilateral ACA branches are within normal limits. Left MCA M1 segment and left MCA bifurcation are patent. Left MCA branches are within normal limits. Right MCA M1 segment and bifurcation are patent. Right MCA branches are mildly irregular compared to the right, perhaps due to vaso spasm. Venous sinuses: Not evaluated due to early contrast timing. Anatomic variants: Mildly dominant left vertebral artery. Dominant left ACA A1. Review of the MIP images confirms the above findings IMPRESSION: 1. Abundant subarachnoid hemorrhage but NO intracranial aneurysm is identified on this initial CTA. In the absence of a follow-up conventional cerebral angiogram, a repeat CTA Head is recommended (e.g. in 12-24 hrs). 2. Mildly irregular appearance of the supraclinoid Right ICA and Right MCA branches suspicious for vasospasm. 3. Abnormal pulmonary ground-glass opacity suspicious for Acute Pulmonary Edema. 4. Mild cervical carotid atherosclerosis without stenosis. No significant intracranial atherosclerosis. Electronically Signed   By: Genevie Ann M.D.   On: 07/06/2020 05:44   CT Head Wo Contrast  Result Date: 07/06/2020 CLINICAL DATA:  Seizure and headache EXAM: CT HEAD WITHOUT CONTRAST TECHNIQUE: Contiguous axial images were obtained from the base of the skull through the vertex without intravenous contrast. COMPARISON:  None. FINDINGS: Brain: Large amount of subarachnoid hemorrhage surrounding the brainstem extending into the sylvian fissures and interhemispheric fissure. No hydrocephalus. No midline shift. Subarachnoid blood also surrounds the cervicomedullary junction. Vascular: No hyperdense vessel or unexpected calcification. Skull: Negative Sinuses/Orbits: No acute finding. Other: None. IMPRESSION:  1. Large amount of subarachnoid hemorrhage in a pattern most typical of aneurysmal hemorrhage. CTA of the head recommended. 2. No hydrocephalus or midline shift. Critical Value/emergent results were called by telephone at the time of interpretation on 07/06/2020 at 3:39 am to provider [facility] , who verbally acknowledged these results. Electronically Signed   By: Ulyses Jarred M.D.   On: 07/06/2020 03:39   CT Angio Neck W and/or Wo Contrast  Result Date: 07/06/2020 CLINICAL DATA:  44 year old female with seizure, headache, aneurysmal pattern subarachnoid hemorrhage on head CT at 0314 hours today. EXAM: CT ANGIOGRAPHY HEAD AND NECK TECHNIQUE: Multidetector CT imaging of the head and neck was performed using the standard protocol during bolus administration of intravenous contrast. Multiplanar CT image reconstructions and MIPs were obtained to evaluate the vascular anatomy. Carotid stenosis measurements (when applicable) are obtained utilizing NASCET criteria, using the distal internal  carotid diameter as the denominator. CONTRAST:  57mL OMNIPAQUE IOHEXOL 350 MG/ML SOLN COMPARISON:  Plain head CT 0314 hours today. FINDINGS: CTA NECK Skeleton: No acute osseous abnormality identified. Upper chest: Confluent bilateral pulmonary ground-glass opacity with centrilobular pattern, slightly greater on the right. No apical pneumothorax or pleural effusion. Other neck: Negative. Aortic arch: 3 vessel arch configuration.  No arch atherosclerosis. Right carotid system: Dense right subclavian vein contrast mildly obscures the proximal right CCA. Negative right CCA. Minimal calcified plaque at the medial right ICA origin without stenosis. Mildly tortuous right ICA, partially retropharyngeal. Left carotid system: Negative left CCA. Mild to moderate calcified plaque at the posterior left ICA bulb without stenosis. Vertebral arteries: Proximal right subclavian artery appears normal. Regional dense venous contrast obscures the  right vertebral artery origin and V1 segment. But the visible cervical right vertebral artery appears patent and normal to the skull base aside from V2 segment tortuosity. Proximal left subclavian artery and left vertebral artery origin are normal. Tortuous left V1 and V2 segments. Otherwise normal left vertebral to the skull base. CTA HEAD Suboptimal intracranial contrast timing. Posterior circulation: Patent distal vertebral arteries, left mildly dominant. Normal right PICA origin. Left AICA probably is dominant. Patent vertebrobasilar junction and basilar artery without stenosis. SCA, PCA origins and basilar tip appear normal. Left posterior communicating artery is present, the right is diminutive or absent. Bilateral PCA branches are within normal limits. Anterior circulation: Both ICA siphons are patent. The left siphon is mildly tortuous with minimal calcified plaque, but no left siphon stenosis or aneurysm. Left posterior communicating artery origin appears normal. Right ICA siphon demonstrates minimal calcified plaque. No right siphon aneurysm or atherosclerotic stenosis. There is a tapered appearance of the supraclinoid right ICA which might be vasospasm. Patent carotid termini. MCA and ACA origins are patent. The left A1 appears dominant. Anterior communicating artery appears within normal limits and there appears to be a median artery of the corpus callosum. No proximal ACA aneurysm identified. Bilateral ACA branches are within normal limits. Left MCA M1 segment and left MCA bifurcation are patent. Left MCA branches are within normal limits. Right MCA M1 segment and bifurcation are patent. Right MCA branches are mildly irregular compared to the right, perhaps due to vaso spasm. Venous sinuses: Not evaluated due to early contrast timing. Anatomic variants: Mildly dominant left vertebral artery. Dominant left ACA A1. Review of the MIP images confirms the above findings IMPRESSION: 1. Abundant subarachnoid  hemorrhage but NO intracranial aneurysm is identified on this initial CTA. In the absence of a follow-up conventional cerebral angiogram, a repeat CTA Head is recommended (e.g. in 12-24 hrs). 2. Mildly irregular appearance of the supraclinoid Right ICA and Right MCA branches suspicious for vasospasm. 3. Abnormal pulmonary ground-glass opacity suspicious for Acute Pulmonary Edema. 4. Mild cervical carotid atherosclerosis without stenosis. No significant intracranial atherosclerosis. Electronically Signed   By: Genevie Ann M.D.   On: 07/06/2020 05:44    Procedures Procedures (including critical care time)  CRITICAL CARE Performed by: Larene Pickett   Total critical care time: 50 minutes  Critical care time was exclusive of separately billable procedures and treating other patients.  Critical care was necessary to treat or prevent imminent or life-threatening deterioration.  Critical care was time spent personally by me on the following activities: development of treatment plan with patient and/or surrogate as well as nursing, discussions with consultants, evaluation of patient's response to treatment, examination of patient, obtaining history from patient or surrogate, ordering and performing  treatments and interventions, ordering and review of laboratory studies, ordering and review of radiographic studies, pulse oximetry and re-evaluation of patient's condition.   Medications Ordered in ED Medications  nicardipine (CARDENE) 20mg  in 0.86% saline 228ml IV infusion (0.1 mg/ml) (12.5 mg/hr Intravenous New Bag/Given 07/06/20 [id number])  acetaminophen (TYLENOL) tablet 1,000 mg (1,000 mg Oral Given 07/06/20 0309)  iohexol (OMNIPAQUE) 350 MG/ML injection 80 mL (80 mLs Intravenous Contrast Given 07/06/20 0506)  levETIRAcetam (KEPPRA) IVPB 1000 mg/100 mL premix (0 mg Intravenous Stopped 07/06/20 0547)  fentaNYL (SUBLIMAZE) injection 50 mcg (50 mcg Intravenous Given 07/06/20 0555)    ED Course  I have  reviewed the triage vital signs and the nursing notes.  Pertinent labs & imaging results that were available during my care of the patient were reviewed by me and considered in my medical decision making (see chart for details).    MDM Rules/Calculators/A&P  44 year old female presenting to the ED via EMS for possible seizure.  Per EMS, was at home with family and suddenly collapsed and had what appeared to be full body jerking movements concerning for seizure.  No history of same.  She did appear postictal with EMS but has slowly arouse.  She did have some repetitive questioning in route.  On arrival she is awake, alert, oriented.  She is able to answer questions and follow commands appropriately.  She does report fairly significant headache.  Denies any known history of seizure activity in the past.  She is not currently on anticoagulation.  Will obtain labs, EKG, CT of the head.  3:45 AM Head CT shows large amount of SAH, suspected aneurysm rupture.  Will obtain CTA head/neck for further evaluation.  Patient and family updated, covid screen has been obtained.    4:05 AM BP increased, now 182/127.  Will start cardene drip for BP control.  Patient has prior hx of HTN, has not been on medications or seen a doctor in several years per significant other at bedside.  4:50 AM Patient mildly dropping sats; stable with 2L supplemental O2, starting to get somewhat somnolent but still arouses and answer questions appropriately.  Called lab as told that chemistry was almost complete but still not resulted.  Given her declining status, will go forward with CTA despite labs.  BP has improved with cardene drip, last pressure 174/94.    5:50 AM Discussed CTA with Dr. Nevada Crane-- no definitive aneurysm identified but still concerning for such with pattern of bleed.  Possibly already occluded or occult.  Does have some vasospasm present as well.  Patient with worsening headache, now getting visible upset due to pain.   BP controlled at this time, 150/90.  Will give small dose of fentanyl.  Has had keppra for seizure prophylaxis, no seizure activity observed here.  6:12 AM Spoke with neurosurgery, NP Maudie Mercury-- they will evaluate in ED.  Patient resting comfortably now after small dose of fentanyl, still arouses appropriately when spoken to.  Significant other at bedside has been updated on plan of care.  Oncoming team made aware of patient and pending neurosurgical consultation.  Final Clinical Impression(s) / ED Diagnoses Final diagnoses:  Subarachnoid hemorrhage [facility])    Rx / DC Orders ED Discharge Orders    None       Larene Pickett, PA-C [id number]    Delora Fuel, MD [id number]    Delora Fuel, MD [id number] [phone]

## 2020-07-06 NOTE — ED Notes (Signed)
Pt very uncomfortable, calling out c/o headache. Provider aware.

## 2020-07-06 NOTE — Anesthesia Postprocedure Evaluation (Signed)
Anesthesia Post Note  Patient: Lisa Haley  Procedure(s) Performed: IR WITH ANESTHESIA (N/A )     Patient location during evaluation: PACU Anesthesia Type: MAC Level of consciousness: lethargic and responds to stimulation (As in preop) Pain management: pain level controlled Vital Signs Assessment: post-procedure vital signs reviewed and stable Respiratory status: spontaneous breathing, nonlabored ventilation and respiratory function stable Cardiovascular status: stable and blood pressure returned to baseline Anesthetic complications: no   No complications documented.  Last Vitals:  Vitals:   07/06/20 1235 07/06/20 1250  BP: (!) 127/59 (!) 114/59  Pulse: 78 86  Resp: (!) 24 (!) 24  Temp: 36.6 C   SpO2: 98% 97%    Last Pain:  Vitals:   07/06/20 1235  TempSrc:   PainSc: [place]

## 2020-07-06 NOTE — Transfer of Care (Signed)
Immediate Anesthesia Transfer of Care Note  Patient: Lisa Haley  Procedure(s) Performed: IR WITH ANESTHESIA (N/A )  Patient Location: PACU  Anesthesia Type:MAC  Level of Consciousness: awake, alert  and oriented  Airway & Oxygen Therapy: Patient Spontanous Breathing and Patient connected to face mask oxygen  Post-op Assessment: Report given to RN, Post -op Vital signs reviewed and stable and Patient moving all extremities X 4  Post vital signs: Reviewed and stable  Last Vitals:  Vitals Value Taken Time  BP 127/59 07/06/20 1236  Temp    Pulse 84 07/06/20 1238  Resp 23 07/06/20 1238  SpO2 95 % 07/06/20 1238  Vitals shown include unvalidated device data.  Last Pain:  Vitals:   07/06/20 0832  TempSrc: Oral  PainSc: 10-Worst pain ever      Patients Stated Pain Goal: 2 (26/94/85 4627)  Complications: No complications documented.

## 2020-07-06 NOTE — Progress Notes (Signed)
Assisted tele visit to patient with family member.  Cannie Muckle R, RN  

## 2020-07-06 NOTE — H&P (Signed)
Lisa Haley is an 44 y.o. female.   HPI:  44 year old female presented to the ED early this morning after her husband witnessed her having a seizure-like activity at about 2:00 in the morning.  States that she got home from work about 1130.  Has been complaining of some headaches over the last couple months but nothing acute or severe last night.  She was sitting at the table and suddenly started to "shake a lot" according to her husband.  Denies any headaches nausea vomiting dizziness or vision changes.  Past Medical History:  Diagnosis Date  . Hypertension   . Obesity     Past Surgical History:  Procedure Laterality Date  . ABDOMINAL HYSTERECTOMY    . TUBAL LIGATION      No Known Allergies  Social History   Tobacco Use  . Smoking status: Current Every Day Smoker    Packs/day: 0.50    Types: Cigarettes  . Smokeless tobacco: Never Used  Substance Use Topics  . Alcohol use: Yes    Comment: once a week    History reviewed. No pertinent family history.   Review of Systems  Positive ROS: As above  All other systems have been reviewed and were otherwise negative with the exception of those mentioned in the HPI and as above.  Objective: Vital signs in last 24 hours: Temp:  [98 F (36.7 C)] 98 F (36.7 C) (12/28 0302) Pulse Rate:  [64-113] 98 (12/28 0630) Resp:  [20-31] 31 (12/28 0630) BP: (146-198)/(69-127) 152/80 (12/28 0630) SpO2:  [89 %-100 %] 93 % (12/28 0630) Weight:  [127.7 kg] 127.7 kg (12/28 0259)  General Appearance: Lethargic, cooperative, no distress, appears stated age Head: Normocephalic, without obvious abnormality, atraumatic Eyes: PERRL, conjunctiva/corneas clear, EOM's intact, fundi benign, both eyes      Back: Symmetric, no curvature, ROM normal, no CVA tenderness Lungs:  respirations unlabored Heart: Regular rate and rhythm Extremities: Extremities normal, atraumatic, no cyanosis or edema Pulses: 2+ and symmetric all extremities Skin: Skin  color, texture, turgor normal, no rashes or lesions  NEUROLOGIC:   Mental status: Lethargic &O x4, no aphasia, short attention span, Memory and fund of knowledge Motor Exam - grossly normal, normal tone and bulk Sensory Exam - grossly normal Reflexes: symmetric, no pathologic reflexes, No Hoffman's, No clonus Coordination - grossly normal Gait -not tested Balance -not tested Cranial Nerves: I: smell Not tested  II: visual acuity  OS: na    OD: na  II: visual fields Full to confrontation  II: pupils Equal, round, reactive to light  III,VII: ptosis None  III,IV,VI: extraocular muscles  Full ROM  V: mastication Normal  V: facial light touch sensation  Normal  V,VII: corneal reflex  Present  VII: facial muscle function - upper  Normal  VII: facial muscle function - lower Normal  VIII: hearing Not tested  IX: soft palate elevation  Normal  IX,X: gag reflex Present  XI: trapezius strength  5/5  XI: sternocleidomastoid strength 5/5  XI: neck flexion strength  5/5  XII: tongue strength  Normal    Data Review Lab Results  Component Value Date   WBC 12.9 (H) 07/06/2020   HGB 15.6 (H) 07/06/2020   HCT 46.0 07/06/2020   MCV 97.6 07/06/2020   PLT 376 07/06/2020   Lab Results  Component Value Date   NA 137 07/06/2020   K 3.8 07/06/2020   CL 103 07/06/2020   CO2 19 (L) 07/06/2020   BUN 14 07/06/2020   CREATININE  0.80 07/06/2020   GLUCOSE 223 (H) 07/06/2020   No results found for: INR, PROTIME  Radiology: CT Angio Head W or Wo Contrast  Result Date: 07/06/2020 CLINICAL DATA:  44 year old female with seizure, headache, aneurysmal pattern subarachnoid hemorrhage on head CT at 0314 hours today. EXAM: CT ANGIOGRAPHY HEAD AND NECK TECHNIQUE: Multidetector CT imaging of the head and neck was performed using the standard protocol during bolus administration of intravenous contrast. Multiplanar CT image reconstructions and MIPs were obtained to evaluate the vascular anatomy. Carotid  stenosis measurements (when applicable) are obtained utilizing NASCET criteria, using the distal internal carotid diameter as the denominator. CONTRAST:  70mL OMNIPAQUE IOHEXOL 350 MG/ML SOLN COMPARISON:  Plain head CT 0314 hours today. FINDINGS: CTA NECK Skeleton: No acute osseous abnormality identified. Upper chest: Confluent bilateral pulmonary ground-glass opacity with centrilobular pattern, slightly greater on the right. No apical pneumothorax or pleural effusion. Other neck: Negative. Aortic arch: 3 vessel arch configuration.  No arch atherosclerosis. Right carotid system: Dense right subclavian vein contrast mildly obscures the proximal right CCA. Negative right CCA. Minimal calcified plaque at the medial right ICA origin without stenosis. Mildly tortuous right ICA, partially retropharyngeal. Left carotid system: Negative left CCA. Mild to moderate calcified plaque at the posterior left ICA bulb without stenosis. Vertebral arteries: Proximal right subclavian artery appears normal. Regional dense venous contrast obscures the right vertebral artery origin and V1 segment. But the visible cervical right vertebral artery appears patent and normal to the skull base aside from V2 segment tortuosity. Proximal left subclavian artery and left vertebral artery origin are normal. Tortuous left V1 and V2 segments. Otherwise normal left vertebral to the skull base. CTA HEAD Suboptimal intracranial contrast timing. Posterior circulation: Patent distal vertebral arteries, left mildly dominant. Normal right PICA origin. Left AICA probably is dominant. Patent vertebrobasilar junction and basilar artery without stenosis. SCA, PCA origins and basilar tip appear normal. Left posterior communicating artery is present, the right is diminutive or absent. Bilateral PCA branches are within normal limits. Anterior circulation: Both ICA siphons are patent. The left siphon is mildly tortuous with minimal calcified plaque, but no left  siphon stenosis or aneurysm. Left posterior communicating artery origin appears normal. Right ICA siphon demonstrates minimal calcified plaque. No right siphon aneurysm or atherosclerotic stenosis. There is a tapered appearance of the supraclinoid right ICA which might be vasospasm. Patent carotid termini. MCA and ACA origins are patent. The left A1 appears dominant. Anterior communicating artery appears within normal limits and there appears to be a median artery of the corpus callosum. No proximal ACA aneurysm identified. Bilateral ACA branches are within normal limits. Left MCA M1 segment and left MCA bifurcation are patent. Left MCA branches are within normal limits. Right MCA M1 segment and bifurcation are patent. Right MCA branches are mildly irregular compared to the right, perhaps due to vaso spasm. Venous sinuses: Not evaluated due to early contrast timing. Anatomic variants: Mildly dominant left vertebral artery. Dominant left ACA A1. Review of the MIP images confirms the above findings IMPRESSION: 1. Abundant subarachnoid hemorrhage but NO intracranial aneurysm is identified on this initial CTA. In the absence of a follow-up conventional cerebral angiogram, a repeat CTA Head is recommended (e.g. in 12-24 hrs). 2. Mildly irregular appearance of the supraclinoid Right ICA and Right MCA branches suspicious for vasospasm. 3. Abnormal pulmonary ground-glass opacity suspicious for Acute Pulmonary Edema. 4. Mild cervical carotid atherosclerosis without stenosis. No significant intracranial atherosclerosis. Electronically Signed   By: Herminio Heads.D.  On: 07/06/2020 05:44   CT Head Wo Contrast  Result Date: 07/06/2020 CLINICAL DATA:  Seizure and headache EXAM: CT HEAD WITHOUT CONTRAST TECHNIQUE: Contiguous axial images were obtained from the base of the skull through the vertex without intravenous contrast. COMPARISON:  None. FINDINGS: Brain: Large amount of subarachnoid hemorrhage surrounding the brainstem  extending into the sylvian fissures and interhemispheric fissure. No hydrocephalus. No midline shift. Subarachnoid blood also surrounds the cervicomedullary junction. Vascular: No hyperdense vessel or unexpected calcification. Skull: Negative Sinuses/Orbits: No acute finding. Other: None. IMPRESSION: 1. Large amount of subarachnoid hemorrhage in a pattern most typical of aneurysmal hemorrhage. CTA of the head recommended. 2. No hydrocephalus or midline shift. Critical Value/emergent results were called by telephone at the time of interpretation on 07/06/2020 at 3:39 am to provider [facility] , who verbally acknowledged these results. Electronically Signed   By: Deatra Robinson M.D.   On: 07/06/2020 03:39   CT Angio Neck W and/or Wo Contrast  Result Date: 07/06/2020 CLINICAL DATA:  44 year old female with seizure, headache, aneurysmal pattern subarachnoid hemorrhage on head CT at 0314 hours today. EXAM: CT ANGIOGRAPHY HEAD AND NECK TECHNIQUE: Multidetector CT imaging of the head and neck was performed using the standard protocol during bolus administration of intravenous contrast. Multiplanar CT image reconstructions and MIPs were obtained to evaluate the vascular anatomy. Carotid stenosis measurements (when applicable) are obtained utilizing NASCET criteria, using the distal internal carotid diameter as the denominator. CONTRAST:  41mL OMNIPAQUE IOHEXOL 350 MG/ML SOLN COMPARISON:  Plain head CT 0314 hours today. FINDINGS: CTA NECK Skeleton: No acute osseous abnormality identified. Upper chest: Confluent bilateral pulmonary ground-glass opacity with centrilobular pattern, slightly greater on the right. No apical pneumothorax or pleural effusion. Other neck: Negative. Aortic arch: 3 vessel arch configuration.  No arch atherosclerosis. Right carotid system: Dense right subclavian vein contrast mildly obscures the proximal right CCA. Negative right CCA. Minimal calcified plaque at the medial right ICA origin  without stenosis. Mildly tortuous right ICA, partially retropharyngeal. Left carotid system: Negative left CCA. Mild to moderate calcified plaque at the posterior left ICA bulb without stenosis. Vertebral arteries: Proximal right subclavian artery appears normal. Regional dense venous contrast obscures the right vertebral artery origin and V1 segment. But the visible cervical right vertebral artery appears patent and normal to the skull base aside from V2 segment tortuosity. Proximal left subclavian artery and left vertebral artery origin are normal. Tortuous left V1 and V2 segments. Otherwise normal left vertebral to the skull base. CTA HEAD Suboptimal intracranial contrast timing. Posterior circulation: Patent distal vertebral arteries, left mildly dominant. Normal right PICA origin. Left AICA probably is dominant. Patent vertebrobasilar junction and basilar artery without stenosis. SCA, PCA origins and basilar tip appear normal. Left posterior communicating artery is present, the right is diminutive or absent. Bilateral PCA branches are within normal limits. Anterior circulation: Both ICA siphons are patent. The left siphon is mildly tortuous with minimal calcified plaque, but no left siphon stenosis or aneurysm. Left posterior communicating artery origin appears normal. Right ICA siphon demonstrates minimal calcified plaque. No right siphon aneurysm or atherosclerotic stenosis. There is a tapered appearance of the supraclinoid right ICA which might be vasospasm. Patent carotid termini. MCA and ACA origins are patent. The left A1 appears dominant. Anterior communicating artery appears within normal limits and there appears to be a median artery of the corpus callosum. No proximal ACA aneurysm identified. Bilateral ACA branches are within normal limits. Left MCA M1 segment and left MCA bifurcation are  patent. Left MCA branches are within normal limits. Right MCA M1 segment and bifurcation are patent. Right MCA  branches are mildly irregular compared to the right, perhaps due to vaso spasm. Venous sinuses: Not evaluated due to early contrast timing. Anatomic variants: Mildly dominant left vertebral artery. Dominant left ACA A1. Review of the MIP images confirms the above findings IMPRESSION: 1. Abundant subarachnoid hemorrhage but NO intracranial aneurysm is identified on this initial CTA. In the absence of a follow-up conventional cerebral angiogram, a repeat CTA Head is recommended (e.g. in 12-24 hrs). 2. Mildly irregular appearance of the supraclinoid Right ICA and Right MCA branches suspicious for vasospasm. 3. Abnormal pulmonary ground-glass opacity suspicious for Acute Pulmonary Edema. 4. Mild cervical carotid atherosclerosis without stenosis. No significant intracranial atherosclerosis. Electronically Signed   By: Genevie Ann M.D.   On: 07/06/2020 05:44     Assessment/Plan: 44 year old female presented to the ED early this morning after having a seizure at home.  CT head showed a large amount of subarachnoid hemorrhage consistent with an aneurysm.  CTA head was negative.  No hydrocephalus or midline shift.  She is fairly post ictal but easily aroused by voice.  We will admit her to the ICU and have Dr. Kathyrn Sheriff see her for a likely angiogram.  Blood pressure control and neurochecks every 1 hour.   Ocie Cornfield [facility] 07/06/2020 6:56 AM

## 2020-07-06 NOTE — Progress Notes (Signed)
  NEUROSURGERY PROGRESS NOTE   Admission history reviewed with Dr. Wynetta Emery and with patient and boyfriend. Had unprovoked SZ early this am after work. Currently complaining of severe HA, no associated vision changes or N/T/W.  (+) tobacco smoker, PMHx significant for HTN. FHx significant for brain aneurysm in father.  EXAM:  BP (!) 151/79   Pulse (!) 109   Temp 98.2 F (36.8 C) (Oral)   Resp (!) 25   Ht 5\' 2"  (1.575 m)   Wt 124.5 kg   SpO2 99%   BMI 50.20 kg/m   Somnolent but arouses to voice Speech fluent CN grossly intact  MAE well  IMAGING: CTH reviewed demonstrating significant, diffuse basal SAH. No HCP.  CTA reviewed and demonstrates some narrowing of the supraclinoid and proximal M1 bilaterally, as well as the distal VA and basilar arteries ?spasm. 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 Initial CTA negative.   PLAN: - Will proceed with diagnostic angiogram, treatment of aneurysm if found  I have reviewed the imaging findings with the patient's boyfriend. We discussed the need for more definitive imaging with angiogram. We also discussed the possibility of identifying an aneurysm and the possible treatment including endovascular coiling and surgical clipping. Associated risks of these procedures were reviewed to include risk of stroke, arterial injury, nephropathy, and contrast reaction. Risks of surgery can also include further SZ, HCP, bleeding, and infection. Pts boyfriend appeared to understand our discussion. All his questions were answered, he provided consent to proceed as above.    56, MD [facility]

## 2020-07-06 NOTE — Anesthesia Procedure Notes (Signed)
Procedure Name: MAC Date/Time: 07/06/2020 11:21 AM Performed by: Reece Agar, CRNA Pre-anesthesia Checklist: Patient identified, Emergency Drugs available, Suction available and Patient being monitored Patient Re-evaluated:Patient Re-evaluated prior to induction Oxygen Delivery Method: Simple face mask

## 2020-07-06 NOTE — Brief Op Note (Signed)
  NEUROSURGERY BRIEF OPERATIVE  NOTE   PREOP DX: Subarachnoid Hemorrhage  POSTOP DX: Same  PROCEDURE: Diagnostic cerebral angiogram  SURGEON: Dr. Consuella Lose, MD  ANESTHESIA: MAC with Local  EBL: Minimal  SPECIMENS: None  COMPLICATIONS: None  CONDITION: Stable to recovery  FINDINGS (Full report in [facility]): 1. No intracranial aneurysms, AVM, or fistulas seen 2. Minimal to no significant stenosis of the intracranial vasculature    Consuella Lose, MD [facility]

## 2020-07-07 ENCOUNTER — Encounter (HOSPITAL_COMMUNITY): Payer: Self-pay | Admitting: Neurosurgery

## 2020-07-07 ENCOUNTER — Inpatient Hospital Stay (HOSPITAL_COMMUNITY): Payer: Self-pay

## 2020-07-07 DIAGNOSIS — I609 Nontraumatic subarachnoid hemorrhage, unspecified: Secondary | ICD-10-CM

## 2020-07-07 LAB — HIV ANTIBODY (ROUTINE TESTING W REFLEX): HIV Screen 4th Generation wRfx: NONREACTIVE

## 2020-07-07 LAB — HEMOGLOBIN A1C
Hgb A1c MFr Bld: 5.3 % (ref 4.8–5.6)
Mean Plasma Glucose: 105.41 mg/dL

## 2020-07-07 LAB — GLUCOSE, CAPILLARY
Glucose-Capillary: 110 mg/dL — ABNORMAL HIGH (ref 70–99)
Glucose-Capillary: 113 mg/dL — ABNORMAL HIGH (ref 70–99)
Glucose-Capillary: 189 mg/dL — ABNORMAL HIGH (ref 70–99)

## 2020-07-07 MED ORDER — INSULIN ASPART 100 UNIT/ML ~~LOC~~ SOLN: 0.0000 [IU] | SUBCUTANEOUS | Status: DC

## 2020-07-07 MED ORDER — INSULIN ASPART 100 UNIT/ML ~~LOC~~ SOLN
0.0000 [IU] | Freq: Three times a day (TID) | SUBCUTANEOUS | Status: DC
  Administered 2020-07-08: 2 [IU] via SUBCUTANEOUS
  Administered 2020-07-09 (×3): 1 [IU] via SUBCUTANEOUS
  Administered 2020-07-10: 2 [IU] via SUBCUTANEOUS
  Administered 2020-07-10: 1 [IU] via SUBCUTANEOUS
  Administered 2020-07-10: 2 [IU] via SUBCUTANEOUS
  Administered 2020-07-11: 3 [IU] via SUBCUTANEOUS
  Administered 2020-07-11 – 2020-07-13 (×2): 1 [IU] via SUBCUTANEOUS

## 2020-07-07 NOTE — TOC CAGE-AID Note (Signed)
Transition of Care [facility]) - CAGE-AID Screening   Patient Details  Name: Lisa Haley MRN: [id number] Date of Birth: September 28, 1975  [facility] CM/SW Contact:    Lorri Frederick, LCSW Phone Number: 07/07/2020, 9:55 AM   Clinical Narrative:CSW completed cage aid screening with pt.  Pt reports she smokes marijuana 3x week, typically one blunt per time.  Pt denies any alcohol use.  Pt denies regular cocaine use but does acknowledge she used cocaine over Christmas.  Reports last use prior to that was almost a year ago.  Pt reports she is working currently, only smokes marijuana on days off, states her marijuana use is not impacting work or other responsibilities in any way.  Pt denies need for treatment and declined resource list.      CAGE-AID Screening:    Have You Ever Felt You Ought to Cut Down on Your Drinking or Drug Use?: Yes Have People Annoyed You By Critizing Your Drinking Or Drug Use?: No Have You Felt Bad Or Guilty About Your Drinking Or Drug Use?: No Have You Ever Had a Drink or Used Drugs First Thing In The Morning to Steady Your Nerves or to Get Rid of a Hangover?: No CAGE-AID Score: 1  Substance Abuse Education Offered: Yes  Substance abuse interventions: Patient Counseling

## 2020-07-07 NOTE — Progress Notes (Signed)
OT Cancellation Note  Patient Details Name: Lisa Haley MRN: [id number] DOB: October 23, 1975   Cancelled Treatment:    Reason Eval/Treat Not Completed: Active bedrest order OT order received and appreciated however this conflicts with current bedrest order set. Please increase activity tolerance as appropriate and remove bedrest from orders. . Please contact OT at [phone] if bed rest order is discontinued. OT will hold evaluation at this time and will check back as time allows pending increased activity orders.   Mateo Flow 07/07/2020, 7:15 AM

## 2020-07-07 NOTE — Progress Notes (Signed)
Transcranial Doppler  Date POD PCO2 HCT BP  MCA ACA PCA OPHT SIPH VERT Basilar  12/29 RH     Right  Left   68  84   -40  -47   33  46   27  21   49  43   -36  -42   *           Right  Left                                            Right  Left                                             Right  Left                                             Right  Left                                            Right  Left                                            Right  Left                                        MCA = Middle Cerebral Artery      OPHT = Opthalmic Artery     BASILAR = Basilar Artery   ACA = Anterior Cerebral Artery     SIPH = Carotid Siphon PCA = Posterior Cerebral Artery   VERT = Verterbral Artery                   Normal MCA = 62+\-12 ACA = 50+\-12 PCA = 42+\-23   * Unable to insonate  07/07/2020 RT [place]ard = 2.19   LT Lind[place]ard = 2.9 Jean Rosenthal, RDMS

## 2020-07-07 NOTE — Evaluation (Signed)
Physical Therapy Evaluation Patient Details Name: Lisa Haley MRN: [id number] DOB: 1975-08-27 Today's Date: 07/07/2020   History of Present Illness  44 yo female admitted to ED on 12/28 with seizure activity, headaches. CTH reveals diffuse basal SAH, angiogram demonstrates no intracranial aneurysms, AVM, or fistulas seen. PMH includes obesity, smoker, some recreational drug use (marijuana 3x/week, occasional cocaine).  Clinical Impression   Pt presents with generalized weakness, neck and back pain, increased time and effort to perform mobility tasks, and decreased activity tolerance. Pt to benefit from acute PT to address deficits. Pt ambulated room distance with intermittent HHA, pt limited by neck and head pain. PT to progress mobility as tolerated, and will continue to follow acutely.      Follow Up Recommendations Home health PT;Supervision for mobility/OOB    Equipment Recommendations  None recommended by PT    Recommendations for Other Services       Precautions / Restrictions Precautions Precautions: Fall Restrictions Weight Bearing Restrictions: No      Mobility  Bed Mobility Overal bed mobility: Needs Assistance Bed Mobility: Supine to Sit     Supine to sit: Min guard;HOB elevated     General bed mobility comments: for safety, increased time and effort.    Transfers Overall transfer level: Needs assistance   Transfers: Sit to/from Stand Sit to Stand: Min assist;+2 safety/equipment         General transfer comment: min assist for power up, steadying upon standing. Pt reaching for IV pole to self-steady, +2 for lines/leads  Ambulation/Gait Ambulation/Gait assistance: Min assist;+2 safety/equipment Gait Distance (Feet): 35 Feet Assistive device: IV Pole;None Gait Pattern/deviations: Step-through pattern;Decreased stride length;Trunk flexed;Wide base of support Gait velocity: decr   General Gait Details: min assist to steady, pt reaching for IV  pole intermittently to self-steady. Slowed, wide BOS gait  Stairs            Wheelchair Mobility    Modified Rankin (Stroke Patients Only) Modified Rankin (Stroke Patients Only) Pre-Morbid Rankin Score: No symptoms Modified Rankin: Moderate disability     Balance Overall balance assessment: Needs assistance Sitting-balance support: No upper extremity supported Sitting balance-Leahy Scale: Good     Standing balance support: During functional activity;No upper extremity supported Standing balance-Leahy Scale: Fair                               Pertinent Vitals/Pain Pain Assessment: 0-10 Pain Score: 10-Worst pain ever Pain Location: neck Pain Descriptors / Indicators: Sore Pain Intervention(s): Limited activity within patient's tolerance;Monitored during session;Repositioned;Premedicated before session    Home Living Family/patient expects to be discharged to:: Private residence Living Arrangements: Children;Spouse/significant other (daughter, son, two step children, one-year-old son, and boyfriend) Available Help at Discharge: Family;Available 24 hours/day Type of Home: Apartment Home Access: Stairs to enter   Entrance Stairs-Number of Steps: 5 Home Layout: One level Home Equipment: None      Prior Function Level of Independence: Independent         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[employer]     Hand Dominance   Dominant Hand: Right    Extremity/Trunk Assessment   Upper Extremity Assessment Upper Extremity Assessment: Defer to OT evaluation    Lower Extremity Assessment Lower Extremity Assessment: Generalized weakness    Cervical / Trunk Assessment Cervical / Trunk Assessment: Normal  Communication   Communication: No difficulties  Cognition Arousal/Alertness: Awake/alert Behavior During Therapy: WFL for tasks assessed/performed Overall Cognitive Status: Within Functional Limits  for tasks assessed                                         General Comments      Exercises     Assessment/Plan    PT Assessment Patient needs continued PT services  PT Problem List Decreased strength;Decreased mobility;Decreased activity tolerance;Decreased balance;Pain;Decreased safety awareness       PT Treatment Interventions DME instruction;Therapeutic activities;Gait training;Therapeutic exercise;Patient/family education;Balance training;Stair training;Functional mobility training;Neuromuscular re-education    PT Goals (Current goals can be found in the Care Plan section)  Acute Rehab PT Goals Patient Stated Goal: stop neck and head pain PT Goal Formulation: With patient Time For Goal Achievement: 07/21/20 Potential to Achieve Goals: Good    Frequency Min 4X/week   Barriers to discharge        Co-evaluation PT/OT/SLP Co-Evaluation/Treatment: Yes Reason for Co-Treatment: For patient/therapist safety;To address functional/ADL transfers PT goals addressed during session: Mobility/safety with mobility;Balance         AM-PAC PT "6 Clicks" Mobility  Outcome Measure Help needed turning from your back to your side while in a flat bed without using bedrails?: A Little Help needed moving from lying on your back to sitting on the side of a flat bed without using bedrails?: A Little Help needed moving to and from a bed to a chair (including a wheelchair)?: A Little Help needed standing up from a chair using your arms (e.g., wheelchair or bedside chair)?: A Little Help needed to walk in hospital room?: A Little Help needed climbing 3-5 steps with a railing? : A Lot 6 Click Score: 17    End of Session Equipment Utilized During Treatment: Gait belt Activity Tolerance: Patient limited by fatigue;Patient limited by pain Patient left: in chair;with chair alarm set;with call bell/phone within reach Nurse Communication: Mobility status PT Visit Diagnosis: Difficulty in walking, not elsewhere classified (R26.2);Pain;Other  symptoms and signs involving the nervous system (R29.898) Pain - Right/Left:  (mid) Pain - part of body:  (neck)    Time: 1027-2536 PT Time Calculation (min) (ACUTE ONLY): 20 min   Charges:   PT Evaluation $PT Eval Low Complexity: 1 Low         Craig Wisnewski S, PT Acute Rehabilitation Services Pager [phone]  Office [phone]  Akyia Borelli E Christain [place] 07/07/2020, 1:02 PM

## 2020-07-07 NOTE — Evaluation (Signed)
Occupational Therapy Evaluation Patient Details Name: Lisa Haley MRN: [id number] DOB: [birth year] Today's Date: 07/07/2020    History of Present Illness 44 yo female admitted to ED on 12/28 with seizure activity, headaches. CTH reveals diffuse basal SAH, angiogram demonstrates no intracranial aneurysms, AVM, or fistulas seen. PMH includes obesity, smoker, some recreational drug use (marijuana 3x/week, occasional cocaine).   Clinical Impression   This 44 y/o female presents with the above. PTA pt reports very independent with ADL, iADL and functional mobility, was working. Today pt performing mobility tasks in room, requiring up to minA while pushing iv pole, requiring up to minA for LB ADL. VSS throughout. Pt fatigued with room level activity but overall tolerating session well. She reports has assist from family after discharge PRN. Pt to benefit from continued acute OT services to maximize her overall safety and independence with ADL and mobility. Do not anticipate she will require follow up OT services after discharge.     Follow Up Recommendations  No OT follow up;Supervision/Assistance - 24 hour (24hr initially)    Equipment Recommendations  None recommended by OT (likely no DME needs, but will continue to assess)           Precautions / Restrictions Precautions Precautions: Fall Restrictions Weight Bearing Restrictions: No      Mobility Bed Mobility Overal bed mobility: Needs Assistance Bed Mobility: Supine to Sit     Supine to sit: Min guard;HOB elevated     General bed mobility comments: for safety, increased time and effort.    Transfers Overall transfer level: Needs assistance Equipment used: 1 person hand held assist Transfers: Sit to/from Stand Sit to Stand: Min assist;+2 safety/equipment         General transfer comment: min assist for power up, steadying upon standing. Pt reaching for IV pole to self-steady, +2 for lines/leads    Balance  Overall balance assessment: Needs assistance Sitting-balance support: No upper extremity supported Sitting balance-Leahy Scale: Good     Standing balance support: During functional activity;No upper extremity supported Standing balance-Leahy Scale: Fair                             ADL either performed or assessed with clinical judgement   ADL Overall ADL's : Needs assistance/impaired Eating/Feeding: Modified independent;Sitting   Grooming: Supervision/safety;Sitting   Upper Body Bathing: Supervision/ safety;Sitting   Lower Body Bathing: Sit to/from stand;Minimal assistance;+2 for safety/equipment   Upper Body Dressing : Supervision/safety;Sitting   Lower Body Dressing: Sit to/from stand;Minimal assistance;+2 for safety/equipment Lower Body Dressing Details (indicate cue type and reason): pt donning socks seated EOB Toilet Transfer: Ambulation;Minimal assistance;+2 for safety/equipment   Toileting- Clothing Manipulation and Hygiene: Min guard;Sit to/from stand       Functional mobility during ADLs: Minimal assistance;+2 for safety/equipment General ADL Comments: pt guarded due to pain in neck/head region     Vision Patient Visual Report: No change from baseline Additional Comments: none noted today, will continue to assess     Perception     Praxis      Pertinent Vitals/Pain Pain Assessment: 0-10 Pain Score: 10-Worst pain ever Pain Location: neck Pain Descriptors / Indicators: Sore Pain Intervention(s): Monitored during session;Repositioned;Limited activity within patient's tolerance;Premedicated before session     Hand Dominance Right   Extremity/Trunk Assessment Upper Extremity Assessment Upper Extremity Assessment: Generalized weakness   Lower Extremity Assessment Lower Extremity Assessment: Defer to PT evaluation   Cervical / Trunk Assessment Cervical /  Trunk Assessment: Normal   Communication Communication Communication: No difficulties    Cognition Arousal/Alertness: Awake/alert Behavior During Therapy: WFL for tasks assessed/performed Overall Cognitive Status: Within Functional Limits for tasks assessed                                 General Comments: higher level cognition not formally assessed but overall appears [facility]   General Comments       Exercises     Shoulder Instructions      Home Living Family/patient expects to be discharged to:: Private residence Living Arrangements: Children;Spouse/significant other Available Help at Discharge: Family;Available 24 hours/day Type of Home: Apartment Home Access: Stairs to enter Entrance Stairs-Number of Steps: 5   Home Layout: One level     Bathroom Shower/Tub: Chief Strategy Officer: Standard     Home Equipment: None          Prior Functioning/Environment Level of Independence: Independent        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        OT Problem List: Decreased strength;Impaired balance (sitting and/or standing);Pain;Decreased knowledge of precautions;Decreased knowledge of use of DME or AE;Obesity;Cardiopulmonary status limiting activity;Decreased activity tolerance      OT Treatment/Interventions: Self-care/ADL training;Therapeutic exercise;Energy conservation;DME and/or AE instruction;Therapeutic activities;Patient/family education;Balance training    OT Goals(Current goals can be found in the care plan section) Acute Rehab OT Goals Patient Stated Goal: stop neck and head pain OT Goal Formulation: With patient Time For Goal Achievement: 07/21/20 Potential to Achieve Goals: Good  OT Frequency: Min 2X/week   Barriers to D/C:            Co-evaluation PT/OT/SLP Co-Evaluation/Treatment: Yes Reason for Co-Treatment: For patient/therapist safety;To address functional/ADL transfers PT goals addressed during session: Mobility/safety with mobility;Balance OT goals addressed during session: ADL's and self-care       AM-PAC OT "6 Clicks" Daily Activity     Outcome Measure Help from another person eating meals?: None Help from another person taking care of personal grooming?: A Little Help from another person toileting, which includes using toliet, bedpan, or urinal?: A Little Help from another person bathing (including washing, rinsing, drying)?: A Little Help from another person to put on and taking off regular upper body clothing?: None Help from another person to put on and taking off regular lower body clothing?: A Little 6 Click Score: 20   End of Session Nurse Communication: Mobility status  Activity Tolerance: Patient tolerated treatment well Patient left: in chair;with call bell/phone within reach;with chair alarm set;with family/visitor present  OT Visit Diagnosis: Muscle weakness (generalized) (M62.81);Other abnormalities of gait and mobility (R26.89);Pain Pain - part of body:  (head/neck)                Time: 1540-0867 OT Time Calculation (min): 24 min Charges:  OT General Charges $OT Visit: 1 Visit OT Evaluation $OT Eval Moderate Complexity: 1 Mod  Lisa Haley, OT Acute Rehabilitation Services Pager [phone] Office [phone]   Orlando Penner 07/07/2020, 2:36 PM

## 2020-07-07 NOTE — Progress Notes (Signed)
  NEUROSURGERY PROGRESS NOTE   No issues overnight.  Complains of neck pain/stiffness. HA improved. No new N/T/W  EXAM:  BP 135/73   Pulse 63   Temp 98.3 F (36.8 C) (Oral)   Resp 15   Ht 5\' 2"  (1.575 m)   Wt 124.5 kg   SpO2 100%   BMI 50.20 kg/m   Awake, alert, oriented  Speech fluent, appropriate  CN grossly intact  5/5 BUE/BLE  Access site: no hematoma  IMPRESSION/PLAN 44 y.o. female angio negative SAH d#2. Neurologically stable. - OOB today. PT/OT - continue ICU observation - continue nimotop, keppra, supportive care - TCDs today

## 2020-07-08 LAB — GLUCOSE, CAPILLARY
Glucose-Capillary: 121 mg/dL — ABNORMAL HIGH (ref 70–99)
Glucose-Capillary: 154 mg/dL — ABNORMAL HIGH (ref 70–99)
Glucose-Capillary: 107 mg/dL — ABNORMAL HIGH (ref 70–99)
Glucose-Capillary: 94 mg/dL (ref 70–99)
Glucose-Capillary: 164 mg/dL — ABNORMAL HIGH (ref 70–99)

## 2020-07-08 MED ORDER — NICOTINE 7 MG/24HR TD PT24
7.0000 mg | MEDICATED_PATCH | Freq: Every day | TRANSDERMAL | Status: DC
  Administered 2020-07-08 – 2020-07-13 (×5): 7 mg via TRANSDERMAL
  Filled 2020-07-08 (×7): qty 1

## 2020-07-08 NOTE — Progress Notes (Signed)
NP Meyran of neurosurgery paged. Patient stated she smokes 5 or 6 cigarettes a day. Patient currently craving nicotine, and requested a patch. Verbal order for a daily 7 mg nicotine patch. NP also made aware that patient states she feels like she has a cough coming on. Patient negative for influenza A and influenza B, as well as SARS Coronavirus 2. Will continue to monitor.

## 2020-07-08 NOTE — Plan of Care (Signed)
Pt able to participate in ADLs.

## 2020-07-08 NOTE — Progress Notes (Signed)
Physical Therapy Treatment Patient Details Name: Lisa Haley MRN: [id number] DOB: [birth year] Today's Date: 07/08/2020    History of Present Illness 44 yo female admitted to ED on 12/28 with seizure activity, headaches. CTH reveals diffuse basal SAH, angiogram demonstrates no intracranial aneurysms, AVM, or fistulas seen. PMH includes obesity, smoker, some recreational drug use (marijuana 3x/week, occasional cocaine).    PT Comments    Pt reporting persistent neck pain and rest and during mobility. Pt mobilizing well, requiring mod I to min guard level of assist overall. Pt ambulated around unit without AD and intermittent single limb support. PT feels pt does not need HHPT any longer, plan for no PT follow up but this PT will administer HEP prior to d/c. Will continue to follow acutely.    Follow Up Recommendations  Supervision for mobility/OOB;No PT follow up     Equipment Recommendations  None recommended by PT    Recommendations for Other Services       Precautions / Restrictions Precautions Precautions: Fall Restrictions Weight Bearing Restrictions: No    Mobility  Bed Mobility Overal bed mobility: Modified Independent Bed Mobility: Supine to Sit;Sit to Supine     Supine to sit: Modified independent (Device/Increase time) Sit to supine: Modified independent (Device/Increase time)   General bed mobility comments: Mod I for increased time and use of bedrail  Transfers Overall transfer level: Needs assistance Equipment used: None Transfers: Sit to/from Stand Sit to Stand: Min guard         General transfer comment: for safety, increased time to rise and steady but did not require AD to perform.  Ambulation/Gait Ambulation/Gait assistance: Min guard Gait Distance (Feet): 250 Feet Assistive device: None Gait Pattern/deviations: Step-through pattern;Decreased stride length;Trunk flexed;Wide base of support Gait velocity: decr   General Gait Details: min  guard initially, transitioning to supervision for safety. Verbal cuing for upright posture, use of hallway railing as needed.   Stairs             Wheelchair Mobility    Modified Rankin (Stroke Patients Only) Modified Rankin (Stroke Patients Only) Pre-Morbid Rankin Score: No symptoms Modified Rankin: Moderate disability     Balance Overall balance assessment: Needs assistance Sitting-balance support: No upper extremity supported Sitting balance-Leahy Scale: Good     Standing balance support: During functional activity;No upper extremity supported Standing balance-Leahy Scale: Fair                              Cognition Arousal/Alertness: Awake/alert Behavior During Therapy: WFL for tasks assessed/performed Overall Cognitive Status: Within Functional Limits for tasks assessed                                        Exercises      General Comments        Pertinent Vitals/Pain Pain Assessment: Faces Faces Pain Scale: Hurts little more Pain Location: neck Pain Descriptors / Indicators: Sore;Discomfort Pain Intervention(s): Limited activity within patient's tolerance;Monitored during session;Repositioned    Home Living                      Prior Function            PT Goals (current goals can now be found in the care plan section) Acute Rehab PT Goals Patient Stated Goal: stop neck and head pain PT Goal  Formulation: With patient Time For Goal Achievement: 07/21/20 Potential to Achieve Goals: Good Progress towards PT goals: Progressing toward goals    Frequency    Min 4X/week      PT Plan Discharge plan needs to be updated    Co-evaluation              AM-PAC PT "6 Clicks" Mobility   Outcome Measure  Help needed turning from your back to your side while in a flat bed without using bedrails?: None Help needed moving from lying on your back to sitting on the side of a flat bed without using bedrails?:  None Help needed moving to and from a bed to a chair (including a wheelchair)?: A Little Help needed standing up from a chair using your arms (e.g., wheelchair or bedside chair)?: A Little Help needed to walk in hospital room?: A Little Help needed climbing 3-5 steps with a railing? : A Little 6 Click Score: 20    End of Session   Activity Tolerance: Patient tolerated treatment well Patient left: with call bell/phone within reach;in bed;with nursing/sitter in room Nurse Communication: Mobility status PT Visit Diagnosis: Difficulty in walking, not elsewhere classified (R26.2);Pain;Other symptoms and signs involving the nervous system (R29.898) Pain - Right/Left:  (mid) Pain - part of body:  (neck)     Time: 9794-8016 PT Time Calculation (min) (ACUTE ONLY): 15 min  Charges:  $Gait Training: 8-22 mins                     Marye Round, PT Acute Rehabilitation Services Pager [phone]  Office [phone]  Tyrone Apple E Christain [place] 07/08/2020, 4:16 PM

## 2020-07-08 NOTE — Progress Notes (Signed)
  NEUROSURGERY PROGRESS NOTE   No issues overnight. HA somewhat improved today  EXAM:  BP 114/73   Pulse 80   Temp 98.9 F (37.2 C) (Oral)   Resp (!) 23   Ht 5\' 2"  (1.575 m)   Wt 124.5 kg   SpO2 98%   BMI 50.20 kg/m   Awake, alert, oriented  Speech fluent, appropriate  CN grossly intact  5/5 BUE/BLE   IMPRESSION:  44 y.o. female SAH d# 3, angio negative. Remains neurologically intact.  PLAN: - Cont supportive care - Cont Nimotop - Plan on repeat angiogram next week   56, MD [facility]

## 2020-07-08 NOTE — Progress Notes (Signed)
Patient is self admittedly anxious with significant other leaving.  She is scoring a 12 on the CIWA alcohol withdrawaly protocol.  Will give 1mg  of ativan, per CIWA prn ativan order.  Will continue to assess and document CIWA hourly post Ativan.

## 2020-07-09 ENCOUNTER — Inpatient Hospital Stay (HOSPITAL_COMMUNITY): Payer: Self-pay

## 2020-07-09 DIAGNOSIS — I609 Nontraumatic subarachnoid hemorrhage, unspecified: Secondary | ICD-10-CM

## 2020-07-09 LAB — GLUCOSE, CAPILLARY
Glucose-Capillary: 150 mg/dL — ABNORMAL HIGH (ref 70–99)
Glucose-Capillary: 148 mg/dL — ABNORMAL HIGH (ref 70–99)
Glucose-Capillary: 137 mg/dL — ABNORMAL HIGH (ref 70–99)
Glucose-Capillary: 182 mg/dL — ABNORMAL HIGH (ref 70–99)

## 2020-07-09 MED ORDER — DEXAMETHASONE SODIUM PHOSPHATE 4 MG/ML IJ SOLN
4.0000 mg | Freq: Four times a day (QID) | INTRAMUSCULAR | Status: AC
  Administered 2020-07-09 – 2020-07-10 (×4): 4 mg via INTRAVENOUS
  Filled 2020-07-09 (×4): qty 1

## 2020-07-09 MED ORDER — WHITE PETROLATUM EX OINT
TOPICAL_OINTMENT | CUTANEOUS | Status: AC
  Filled 2020-07-09: qty 28.35

## 2020-07-09 NOTE — Progress Notes (Signed)
Transcranial Doppler  Date POD PCO2 HCT BP  MCA ACA PCA OPHT SIPH VERT Basilar  12/29 RH     Right  Left   68  84   -40  -47   33  46   27  21   49  43   -36  -42   *      12/31     Right  Left   65  50   -27  -36   -53  -47   23  26   -49  -29   -28  -38   -23           Right  Left                                             Right  Left                                             Right  Left                                            Right  Left                                            Right  Left                                        MCA = Middle Cerebral Artery      OPHT = Opthalmic Artery     BASILAR = Basilar Artery   ACA = Anterior Cerebral Artery     SIPH = Carotid Siphon PCA = Posterior Cerebral Artery   VERT = Verterbral Artery                   Normal MCA = 62+\-12 ACA = 50+\-12 PCA = 42+\-23   * Unable to insonate  07/07/2020 RT [place]ard = 2.19   LT [place]ard = 2.9 07/09/2020 RT [place]ard = 3.25  LT [place] = 1.72 Jean Rosenthal, RDMS

## 2020-07-09 NOTE — Progress Notes (Signed)
   Providing Compassionate, Quality Care - Together  NEUROSURGERY PROGRESS NOTE   S: No issues overnight.  Persistent headaches, somewhat controlled with pain medication  O: EXAM:  BP (!) 149/86   Pulse 88   Temp 98.6 F (37 C) (Oral)   Resp (!) 23   Ht 5\' 2"  (1.575 m)   Wt 124.5 kg   SpO2 96%   BMI 50.20 kg/m   Awake, alert, oriented X3 PERRLA, EOMI Speech fluent, appropriate  CNs grossly intact  5/5 BUE/BLE  Sensory intact light touch No drift  ASSESSMENT:  44 y.o. female SAH d#4, angio negative  PLAN: - Cont supportive care - Cont Nimotop - Plan on repeat angiogram next week, tentatively Monday per Dr. Saturday -PT/OT, SBP control, pain control    Thank you for allowing me to participate in this patient's care.  Please do not hesitate to call with questions or concerns.   Conchita Paris, DO Neurosurgeon [facility] Cell: [phone]

## 2020-07-10 ENCOUNTER — Encounter (HOSPITAL_COMMUNITY): Payer: Self-pay | Admitting: Neurosurgery

## 2020-07-10 DIAGNOSIS — I639 Cerebral infarction, unspecified: Secondary | ICD-10-CM

## 2020-07-10 HISTORY — DX: Cerebral infarction, unspecified: I63.9

## 2020-07-10 LAB — GLUCOSE, CAPILLARY
Glucose-Capillary: 170 mg/dL — ABNORMAL HIGH (ref 70–99)
Glucose-Capillary: 163 mg/dL — ABNORMAL HIGH (ref 70–99)
Glucose-Capillary: 145 mg/dL — ABNORMAL HIGH (ref 70–99)
Glucose-Capillary: 165 mg/dL — ABNORMAL HIGH (ref 70–99)

## 2020-07-10 MED ORDER — MENTHOL 3 MG MT LOZG
1.0000 | LOZENGE | OROMUCOSAL | Status: DC | PRN
  Administered 2020-07-11 (×2): 3 mg via ORAL
  Filled 2020-07-10: qty 9

## 2020-07-10 MED ORDER — BISACODYL 5 MG PO TBEC
5.0000 mg | DELAYED_RELEASE_TABLET | Freq: Every day | ORAL | Status: DC | PRN
  Administered 2020-07-11: 5 mg via ORAL
  Filled 2020-07-10: qty 1

## 2020-07-10 MED ORDER — POLYETHYLENE GLYCOL 3350 17 G PO PACK
17.0000 g | PACK | Freq: Every day | ORAL | Status: DC | PRN
  Administered 2020-07-10 – 2020-07-11 (×2): 17 g via ORAL
  Filled 2020-07-10 (×2): qty 1

## 2020-07-10 NOTE — Progress Notes (Signed)
Subjective: Patient reports that she is continuing to experience right-sided headaches.   Objective: Vital signs in last 24 hours: Temp:  [98.2 F (36.8 C)-99.8 F (37.7 C)] 99 F (37.2 C) (01/01 0315) Pulse Rate:  [65-110] 84 (01/01 0500) Resp:  [15-28] 16 (01/01 0500) BP: (82-178)/(50-97) 155/81 (01/01 0500) SpO2:  [91 %-100 %] 97 % (01/01 0500)  Intake/Output from previous day: 12/31 0701 - 01/01 0700 In: 3382.7 [P.O.:640; I.V.:2642.7; IV Piggyback:100] Out: -  Intake/Output this shift: No intake/output data recorded.  Physical Exam: Patient is awake, A/O X3. PERRLA, EOMI. Speech is fluent and appropriate. CNs grossly intact. Full strength that is symmetric in her BUE and BLE. Sensation is intact.   Lab Results: No results for input(s): WBC, HGB, HCT, PLT in the last 72 hours. BMET No results for input(s): NA, K, CL, CO2, GLUCOSE, BUN, CREATININE, CALCIUM in the last 72 hours.  Studies/Results: VAS [place] TRANSCRANIAL DOPPLER  Result Date: 07/09/2020  Transcranial Doppler Indications: Subarachnoid hemorrhage. Comparison Study: Prior TCD 07-07-2020. Performing Technologist: Jean Rosenthal, RDMS Supporting Technologist: Clint Guy, RVT  Examination Guidelines: A complete evaluation includes B-mode imaging, spectral Doppler, color Doppler, and power Doppler as needed of all accessible portions of each vessel. Bilateral testing is considered an integral part of a complete examination. Limited examinations for reoccurring indications may be performed as noted.  +----------+-------------+----------+-----------+-------+ RIGHT TCD Right VM (cm)Depth (cm)PulsatilityComment +----------+-------------+----------+-----------+-------+ MCA           65.00       4.80      1.11            +----------+-------------+----------+-----------+-------+ ACA           27.00       5.90      1.29            +----------+-------------+----------+-----------+-------+ Term ICA      49.00        5.80      0.88            +----------+-------------+----------+-----------+-------+ PCA           53.00       5.50      0.86            +----------+-------------+----------+-----------+-------+ Opthalmic     23.00       3.60      1.35            +----------+-------------+----------+-----------+-------+ ICA siphon    42.00       6.20      0.99            +----------+-------------+----------+-----------+-------+ Vertebral     28.00       5.80      0.81            +----------+-------------+----------+-----------+-------+ Distal ICA    20.00       3.10      1.20            +----------+-------------+----------+-----------+-------+  +----------+------------+----------+-----------+-------+ LEFT TCD  Left VM (cm)Depth (cm)PulsatilityComment +----------+------------+----------+-----------+-------+ MCA          50.00       4.00      1.02            +----------+------------+----------+-----------+-------+ ACA          36.00       5.60      1.17            +----------+------------+----------+-----------+-------+ PCA  47.00       5.20      0.75            +----------+------------+----------+-----------+-------+ Opthalmic    26.00       3.60      1.34            +----------+------------+----------+-----------+-------+ ICA siphon   29.00       6.20      1.13            +----------+------------+----------+-----------+-------+ Vertebral    38.00       6.00      0.70            +----------+------------+----------+-----------+-------+ Distal ICA   29.00       2.40      1.06            +----------+------------+----------+-----------+-------+  +------------+-------+-------+             VM cm/sComment +------------+-------+-------+ Prox Basilar 23.00         +------------+-------+-------+ +----------------------+----+ Right Lindegaard Ratio3.25 +----------------------+----+ +---------------------+----+ Left Lindegaard Ratio1.72  +---------------------+----+    Preliminary     Assessment/Plan: Patient is doing well. Continues to have c/o headaches that are moderately controlled with analgesics. Continue nimotop, supportive care, and pain control. Plan for repeat angiogram on Monday by Dr. Kathyrn Sheriff.    LOS: 4 days     Marvis Moeller, DNP, NP-C 07/10/2020, 8:15 AM

## 2020-07-10 NOTE — Plan of Care (Signed)
  Problem: Elimination: Goal: Will not experience complications related to bowel motility Outcome: Progressing   Patient with no BM since PTA. MD aware, orders obtained. Patient educated with good understanding verbalized.

## 2020-07-10 NOTE — Progress Notes (Signed)
Patient is c/o dry cough that is exacerbating her HA.  Josh NP aware, order obtained.

## 2020-07-11 LAB — TRIGLYCERIDES: Triglycerides: 80 mg/dL (ref <150)

## 2020-07-11 LAB — GLUCOSE, CAPILLARY
Glucose-Capillary: 115 mg/dL — ABNORMAL HIGH (ref 70–99)
Glucose-Capillary: 126 mg/dL — ABNORMAL HIGH (ref 70–99)
Glucose-Capillary: 203 mg/dL — ABNORMAL HIGH (ref 70–99)
Glucose-Capillary: 173 mg/dL — ABNORMAL HIGH (ref 70–99)

## 2020-07-11 NOTE — Progress Notes (Signed)
NEUROSURGERY PROGRESS NOTE  Doing ok. Complains of severe headaches which are unchanged. SAH with negative angiogram. Plan is for another angiogram tomorrow by Dr. Conchita Paris. No new nsgy recom today.  Temp:  [98.2 F (36.8 C)-98.9 F (37.2 C)] 98.6 F (37 C) (01/02 0813) Pulse Rate:  [25-114] 84 (01/02 0900) Resp:  [13-41] 19 (01/02 0900) BP: (106-165)/(52-120) 136/74 (01/02 0900) SpO2:  [88 %-100 %] 96 % (01/02 0900)   Sherryl Manges, NP 07/11/2020 10:09 AM

## 2020-07-12 ENCOUNTER — Inpatient Hospital Stay (HOSPITAL_COMMUNITY): Payer: Self-pay

## 2020-07-12 DIAGNOSIS — I609 Nontraumatic subarachnoid hemorrhage, unspecified: Secondary | ICD-10-CM

## 2020-07-12 HISTORY — PX: IR ANGIO INTRA EXTRACRAN SEL INTERNAL CAROTID BILAT MOD SED: IMG5363

## 2020-07-12 HISTORY — PX: IR ANGIO VERTEBRAL SEL VERTEBRAL BILAT MOD SED: IMG5369

## 2020-07-12 LAB — GLUCOSE, CAPILLARY
Glucose-Capillary: 115 mg/dL — ABNORMAL HIGH (ref 70–99)
Glucose-Capillary: 128 mg/dL — ABNORMAL HIGH (ref 70–99)
Glucose-Capillary: 154 mg/dL — ABNORMAL HIGH (ref 70–99)

## 2020-07-12 MED ORDER — MIDAZOLAM HCL 2 MG/2ML IJ SOLN
INTRAMUSCULAR | Status: AC | PRN
  Administered 2020-07-12: 1 mg via INTRAVENOUS

## 2020-07-12 MED ORDER — HEPARIN SODIUM (PORCINE) 1000 UNIT/ML IJ SOLN
INTRAMUSCULAR | Status: AC | PRN
  Administered 2020-07-12: 2000 [IU] via INTRAVENOUS

## 2020-07-12 MED ORDER — LIDOCAINE HCL (PF) 1 % IJ SOLN
INTRAMUSCULAR | Status: AC | PRN
Start: 2020-07-12 — End: 2020-07-12
  Administered 2020-07-12: 10 mL

## 2020-07-12 MED ORDER — IOHEXOL 300 MG/ML  SOLN
150.0000 mL | Freq: Once | INTRAMUSCULAR | Status: AC | PRN
  Administered 2020-07-12: 48 mL via INTRA_ARTERIAL

## 2020-07-12 MED ORDER — FENTANYL CITRATE (PF) 100 MCG/2ML IJ SOLN
INTRAMUSCULAR | Status: AC
  Filled 2020-07-12: qty 2

## 2020-07-12 MED ORDER — HEPARIN SODIUM (PORCINE) 1000 UNIT/ML IJ SOLN
INTRAMUSCULAR | Status: AC
  Filled 2020-07-12: qty 1

## 2020-07-12 MED ORDER — MIDAZOLAM HCL 2 MG/2ML IJ SOLN
INTRAMUSCULAR | Status: AC
  Filled 2020-07-12: qty 2

## 2020-07-12 MED ORDER — LIDOCAINE HCL 1 % IJ SOLN
INTRAMUSCULAR | Status: AC
  Filled 2020-07-12: qty 20

## 2020-07-12 MED ORDER — FENTANYL CITRATE (PF) 100 MCG/2ML IJ SOLN
INTRAMUSCULAR | Status: AC | PRN
Start: 2020-07-12 — End: 2020-07-12
  Administered 2020-07-12: 25 ug via INTRAVENOUS

## 2020-07-12 NOTE — Progress Notes (Signed)
Physical Therapy Treatment Patient Details Name: Lisa Haley MRN: [id number] DOB: Sep 27, 1975 Today's Date: 07/12/2020    History of Present Illness 45 yo female admitted to ED on 12/28 with seizure activity, headaches. CTH reveals diffuse basal SAH, angiogram demonstrates no intracranial aneurysms, AVM, or fistulas seen. PMH includes obesity, smoker, some recreational drug use (marijuana 3x/week, occasional cocaine).    PT Comments    Pt reporting head and L hip pain today, but agreeable to OOB mobility. Pt requiring increased time for mobility tasks today secondary to pain and fatigue, stating "I feel weaker than normal". Pt required standing rest break during hallway ambulation to recover LE fatigue, tachycardia, and tachypnea. Pt reports tentative plan is d/c tomorrow if procedure today goes well.    Follow Up Recommendations  Supervision for mobility/OOB;No PT follow up     Equipment Recommendations  None recommended by PT    Recommendations for Other Services       Precautions / Restrictions Precautions Precautions: Fall Restrictions Weight Bearing Restrictions: No    Mobility  Bed Mobility Overal bed mobility: Needs Assistance Bed Mobility: Supine to Sit;Sit to Supine     Supine to sit: Modified independent (Device/Increase time)     General bed mobility comments: Increased time, HOB elevated  Transfers Overall transfer level: Needs assistance Equipment used: None Transfers: Sit to/from Stand Sit to Stand: Supervision         General transfer comment: for safety, pt reaching for IV pole once standing to self-steady  Ambulation/Gait Ambulation/Gait assistance: Supervision Gait Distance (Feet): 200 Feet Assistive device: IV Pole Gait Pattern/deviations: Step-through pattern;Decreased stride length;Trunk flexed;Wide base of support Gait velocity: decr   General Gait Details: supervision for safety, standing rest break x1 due to DOE 2/4 and HRmax 143  bpm. Slow, steady gait with SL support   Stairs             Wheelchair Mobility    Modified Rankin (Stroke Patients Only) Modified Rankin (Stroke Patients Only) Pre-Morbid Rankin Score: No symptoms Modified Rankin: Moderate disability     Balance Overall balance assessment: Needs assistance Sitting-balance support: No upper extremity supported Sitting balance-Leahy Scale: Good     Standing balance support: During functional activity;No upper extremity supported Standing balance-Leahy Scale: Fair                              Cognition Arousal/Alertness: Awake/alert Behavior During Therapy: WFL for tasks assessed/performed Overall Cognitive Status: Within Functional Limits for tasks assessed                                        Exercises      General Comments General comments (skin integrity, edema, etc.): HR 109-143 bpm, SpO2 WFL on RA, RRmax observed 30s      Pertinent Vitals/Pain Pain Assessment: Faces Faces Pain Scale: Hurts little more Pain Location: neck, L hip Pain Descriptors / Indicators: Sore;Discomfort Pain Intervention(s): Limited activity within patient's tolerance;Monitored during session;Premedicated before session;Repositioned    Home Living                      Prior Function            PT Goals (current goals can now be found in the care plan section) Acute Rehab PT Goals Patient Stated Goal: stop neck and head pain PT  Goal Formulation: With patient Time For Goal Achievement: 07/21/20 Potential to Achieve Goals: Good Progress towards PT goals: Progressing toward goals    Frequency    Min 4X/week      PT Plan Current plan remains appropriate    Co-evaluation              AM-PAC PT "6 Clicks" Mobility   Outcome Measure  Help needed turning from your back to your side while in a flat bed without using bedrails?: None Help needed moving from lying on your back to sitting on the  side of a flat bed without using bedrails?: None Help needed moving to and from a bed to a chair (including a wheelchair)?: A Little Help needed standing up from a chair using your arms (e.g., wheelchair or bedside chair)?: A Little Help needed to walk in hospital room?: A Little Help needed climbing 3-5 steps with a railing? : A Little 6 Click Score: 20    End of Session   Activity Tolerance: Patient tolerated treatment well Patient left: with call bell/phone within reach;in bed;with nursing/sitter in room Nurse Communication: Mobility status PT Visit Diagnosis: Difficulty in walking, not elsewhere classified (R26.2);Pain;Other symptoms and signs involving the nervous system (R29.898) Pain - Right/Left:  (mid) Pain - part of body:  (neck)     Time: 3235-5732 PT Time Calculation (min) (ACUTE ONLY): 23 min  Charges:  $Gait Training: 8-22 mins $Therapeutic Activity: 8-22 mins                     Marye Round, PT Acute Rehabilitation Services Pager [phone]  Office [phone]    Lisa Haley 07/12/2020, 11:39 AM

## 2020-07-12 NOTE — Progress Notes (Signed)
Transcranial Doppler  Date POD PCO2 HCT BP  MCA ACA PCA OPHT SIPH VERT Basilar  12/29 RH     Right  Left   68  84   -40  -47   33  46   27  21   49  43   -36  -42   *      12/31     Right  Left   65  50   -27  -36   -53  -47   23  26   -49  -29   -28  -38   -23      1/3 MR     Right  Left   69  52   -27  -39   40  41   20  25   45  37   -30  -26   -41            Right  Left                                             Right  Left                                            Right  Left                                            Right  Left                                        MCA = Middle Cerebral Artery      OPHT = Opthalmic Artery     BASILAR = Basilar Artery   ACA = Anterior Cerebral Artery     SIPH = Carotid Siphon PCA = Posterior Cerebral Artery   VERT = Verterbral Artery                   Normal MCA = 62+\-12 ACA = 50+\-12 PCA = 42+\-23   * Unable to insonate  07/07/2020 RT Lindegaard = 2.19   LT Lindegaard = 2.9 07/09/2020 RT Lindegaard = 3.25  LT Lindegaard = 1.72 07/12/2020 RT Lindegaard = 3.55  LT Lindegaard = 1.79   Shalie Schremp 07/12/2020 11:44 AM

## 2020-07-12 NOTE — Brief Op Note (Signed)
  NEUROSURGERY BRIEF OPERATIVE  NOTE   PREOP DX: Subarachnoid Hemorrhage  POSTOP DX: Same  PROCEDURE: Diagnostic cerebral angiogram  SURGEON: Dr. Lisbeth Renshaw, MD  ANESTHESIA: IV Sedation with Local  EBL: Minimal  SPECIMENS: None  COMPLICATIONS: None  CONDITION: Stable to recovery  FINDINGS (Full report in [facility]): 1. No aneurysms, AVM, or fistulas. No vasospasm   Lisbeth Renshaw, MD [facility]

## 2020-07-12 NOTE — Sedation Documentation (Signed)
Sheath removed and 5 fr exoseal deployed at 1644

## 2020-07-12 NOTE — Progress Notes (Signed)
  NEUROSURGERY PROGRESS NOTE   No issues overnight. Has some HA this am.  EXAM:  BP 115/66   Pulse 99   Temp 98.9 F (37.2 C) (Oral)   Resp (!) 23   Ht 5\' 2"  (1.575 m)   Wt 124.5 kg   SpO2 97%   BMI 50.20 kg/m   Awake, alert, oriented  Speech fluent, appropriate  CN grossly intact  5/5 BUE/BLE   IMPRESSION:  45 y.o. female SAH d# 7, initial angio negative. Remains neurologically intact  PLAN: - Repeat angio this pm. If negative, will likely d/c home tomorrow   55, MD [facility]

## 2020-07-13 ENCOUNTER — Other Ambulatory Visit (HOSPITAL_COMMUNITY): Payer: Self-pay | Admitting: Physician Assistant

## 2020-07-13 LAB — GLUCOSE, CAPILLARY
Glucose-Capillary: 146 mg/dL — ABNORMAL HIGH (ref 70–99)
Glucose-Capillary: 187 mg/dL — ABNORMAL HIGH (ref 70–99)

## 2020-07-13 MED ORDER — ACETAMINOPHEN-CODEINE #3 300-30 MG PO TABS: 1.0000 | ORAL_TABLET | ORAL | 0 refills | Status: DC | PRN

## 2020-07-13 MED ORDER — LEVETIRACETAM 500 MG PO TABS: 500.0000 mg | ORAL_TABLET | Freq: Two times a day (BID) | ORAL | 2 refills | Status: DC

## 2020-07-13 MED FILL — levETIRAcetam 500 MG TABS: 500 | 30 days supply | Qty: 60 | Fill #0

## 2020-07-13 MED FILL — ACETAMINOPHEN-COD #3 TABLET: 300-30 | 7 days supply | Qty: 42 | Fill #0

## 2020-07-13 NOTE — Progress Notes (Signed)
OT Cancellation Note  Patient Details Name: Lisa Haley MRN: [id number] DOB: [birth year]   Cancelled Treatment:    Reason Eval/Treat Not Completed: Patient declined, no reason specified  Wynona Neat, OTR/L  Acute Rehabilitation Services Pager: [phone] Office: [phone] .  07/13/2020, 10:18 AM

## 2020-07-13 NOTE — TOC Transition Note (Signed)
Transition of Care [facility]) - CM/SW Discharge Note   Patient Details  Name: Lisa Haley MRN: [id number] Date of Birth: 04-06-1976  Transition of Care [facility]) CM/SW Contact:  Glennon Mac, RN Phone Number: 07/13/2020, 12:02 PM   Clinical Narrative:   45 yo female admitted to ED on 12/28 with seizure activity, headaches. CTH reveals diffuse basal SAH, angiogram demonstrates no intracranial aneurysms, AVM, or fistulas seen.  PTA, pt independent and living at home with spouse and children.  PT/OT recommending no OP follow up.  Family able to provide assistance at discharge.  Pt has no PCP: she is agreeable to PCP follow up at [facility], and follow up appt made.  Pt eligible for medication assistance through [facility] program; DC Rx sent to [facility] pharmacy to be filled using MATCH letter.     Final next level of care: Home/Self Care Barriers to Discharge: Barriers Resolved                 Discharge Plan and Services   Discharge Planning Services: CM Consult                                 Social Determinants of Health (SDOH) Interventions     Readmission Risk Interventions Readmission Risk Prevention Plan 07/13/2020  Post Dischage Appt Complete  Medication Screening Complete  Transportation Screening Complete  Some recent data might be hidden   Quintella Baton, RN, BSN  Trauma/Neuro ICU Case Manager [phone]

## 2020-07-13 NOTE — Discharge Summary (Signed)
Physician Discharge Summary  Patient ID: Lisa Haley MRN: [id number] DOB/AGE: [birth year] 45 y.o.  Admit date: 07/06/2020 Discharge date: 07/13/2020  Admission Diagnoses:  [facility]  Discharge Diagnoses:  Same Active Problems:   SAH (subarachnoid hemorrhage) [facility])   Discharged Condition: Stable  Hospital Course:  Lisa Haley is a 45 y.o. female who presented to ED 12/28 with 2 day history of severe HA after noticed to have a seizure by family. She was brought to the hospital and was found to have CTA negative SAH. She underwent formal diagnostic angiogram x2 which confirmed no evidence if arterial malformation including aneurysm. She was monitored in the neuro ICU for 8 days and remained neurologically intact. She worked with therapy and was deemed safe for d/c home with outpatient f.u. At time of discharge, pain was well controlled, ambulating with Pt/OT, tolerating po, voiding normal. Ready for discharge. Will need to continue Keppra at home. No indication to continue nimotop.  Treatments: Surgery Diagnostic cerebral angiogram x2  Discharge Exam: Blood pressure (!) 120/54, pulse 97, temperature 98.3 F (36.8 C), temperature source Oral, resp. rate 20, height 5\' 2"  (1.575 m), weight 124.5 kg, SpO2 97 %. Awake, alert, oriented Speech fluent, appropriate CN grossly intact 5/5 BUE/BLE Wound c/d/i  Disposition: Discharge disposition: 01-Home or Self Care       Discharge Instructions    Call MD for:  difficulty breathing, headache or visual disturbances   Complete by: As directed    Call MD for:  persistant dizziness or light-headedness   Complete by: As directed    Call MD for:  redness, tenderness, or signs of infection (pain, swelling, redness, odor or green/yellow discharge around incision site)   Complete by: As directed    Call MD for:  severe uncontrolled pain   Complete by: As directed    Call MD for:  temperature >100.4   Complete by: As directed     Diet - low sodium heart healthy   Complete by: As directed    Driving Restrictions   Complete by: As directed    Do not drive until given clearance.   Increase activity slowly   Complete by: As directed    Lifting restrictions   Complete by: As directed    Do not lift anything >10lbs. Avoid bending and twisting in awkward positions. Avoid bending at the back.   May shower / Bathe   Complete by: As directed    In 24 hours. Okay to wash wound with warm soapy water. Avoid scrubbing the wound. Pat dry.   No wound care   Complete by: As directed      Allergies as of 07/13/2020   No Known Allergies     Medication List    TAKE these medications   acetaminophen 500 MG tablet Commonly known as: TYLENOL Take 1,000 mg by mouth every 6 (six) hours as needed for moderate pain or headache.   acetaminophen-codeine 300-30 MG tablet Commonly known as: TYLENOL #3 Take 1-2 tablets by mouth every 4 (four) hours as needed for moderate pain.   ibuprofen 200 MG tablet Commonly known as: ADVIL Take 400 mg by mouth every 6 (six) hours as needed for headache or moderate pain.       Follow-up Information    Consuella Lose, MD. Schedule an appointment as soon as possible for a visit in 3 week(s).   Specialty: Neurosurgery Contact information: [address] [place] [address] [phone]  SignedDarci Current Leafy Motsinger 07/13/2020, 10:05 AM

## 2020-07-13 NOTE — Progress Notes (Signed)
Patient received medication from pharmacy at this time. And Discharge instructions reviewed.

## 2020-07-13 NOTE — Progress Notes (Signed)
  NEUROSURGERY PROGRESS NOTE   No issues overnight.  Coughing causes worsening HA No N/T/W  EXAM:  BP (!) 120/54   Pulse 97   Temp 98.3 F (36.8 C) (Oral)   Resp 20   Ht 5\' 2"  (1.575 m)   Wt 124.5 kg   SpO2 97%   BMI 50.20 kg/m   Awake, alert, oriented  Speech fluent, appropriate  CN grossly intact  5/5 BUE/BLE   IMPRESSION/PLAN 45 y.o. female SAH d#8, angio x2 negative SAH. HA as expected - d/c home

## 2020-07-16 ENCOUNTER — Other Ambulatory Visit (HOSPITAL_COMMUNITY): Payer: Self-pay | Admitting: Student

## 2020-07-19 ENCOUNTER — Other Ambulatory Visit: Payer: Self-pay | Admitting: *Deleted

## 2020-07-19 ENCOUNTER — Encounter: Payer: Self-pay | Admitting: *Deleted

## 2020-07-19 MED FILL — ACETAMINOPHEN-COD #3 TABLET: 300-30 | 4 days supply | Qty: 42 | Fill #0

## 2020-07-19 NOTE — Patient Outreach (Signed)
[place] [facility]) Care Management  07/19/2020  Lisa Haley [birth year] [id number]   EMMI- stroke  RED ON EMMI ALERT Day # 3 Date: 07/17/20 Red Alert Reason: Questions /problems with medications ?, Yes    Outreach attempt #1  [facility] Care Management RN reviewed and addressed red alert with patient. Question /problem with medications? Yes, patient discussed      Social: Patient lives at home, has support from her daughter and boyfriend. She reports having transportation to appointments and support from in home for assisting with meals.    Conditions: She discussed recent admission with seizure activity, headache, Dx: Subarachnoid hemorrhage, angio demonstrates no intracranial aneurysms, AVM or fistulas.  Patient reports headache as at  discharge managed with prn tylenol #3 taking 2 about every 4 hours and receiving relief, reports headache worse with cough or loud noises. She discussed her headache being managed in the hospital with morphine  .Noted being prescribed #42 tablets at discharge on 07/13/20, reinforced education on importance of taking medications as prescribed and safety precautions.  She reports now being out of medications. She reports placing call to Dr. Kathyrn Sheriff office on 1/7 regarding getting a refill on medication. She reports not receiving return call yet, assisted patient with making call to office to follow up concern regarding headache and bing out of tylenol #3 able to leave message on nurse line to provider regarding patient concern  as well as requesting follow up appointment with provider.   Discussed with patient if she has tried tylenol or advil for headache recommended at discharge for headache, she reports no relief with tylenol. She has not tried Advil she will get her daughter to pick up medication for her.   Patient discussed improvement in walking at home, denies dizziness, no seizure activity.    Medications: Patient concern regarding  needing refill on tylenol #3 as contacted neurosurgeon office. Reinforced education on taking medication as prescribed  She is able to identify medication for seizure, Keppra.  Appointments: Has post discharge appointment with [facility] on 1/28 Placed call to [place] office today to schedule post discharge  appointment.     Advised patient that there will be further automated EMMI- post discharge calls to assess how the patient is doing following the recent hospitalization Advised the patient that another call may be received from a nurse if any of their responses were abnormal. Patient voiced understanding and was appreciative of f/u call.She is agreeable to follow up call in the next 4 days to follow up on scheduling appointment with neurosurgeon and concern regarding headache and medication refill.  Discussed worsening symptoms to notify MD of , worsening headache , seizure, increase weakness , change in balance, vision, speech.    Joylene Draft, RN, BSN  [place] Management Coordinator  [phone]- Mobile [phone]- Toll Free Main Office

## 2020-07-22 ENCOUNTER — Other Ambulatory Visit: Payer: Self-pay | Admitting: *Deleted

## 2020-07-22 NOTE — Patient Outreach (Signed)
[place] [facility]) Care Management  07/22/2020  Pearl Bents 1975/11/26 [id number]   EMMI- stroke  RED ON EMMI ALERT Day # 3 Date: 07/17/20 Red Alert Reason: Questions /problems with medications ?, Yes    Outreach attempt #2/follow up call   Subjective:   Unsuccessful call attempt to patient,able to leave a HIPAA compliant voice mail message for return call.    Plan Will plan return call in the next 4 business days, if no return call on today.    Joylene Draft, RN, BSN  [place] Management Coordinator  [phone]- Mobile [phone]- Toll Free Main Office

## 2020-07-27 ENCOUNTER — Other Ambulatory Visit: Payer: Self-pay | Admitting: *Deleted

## 2020-07-27 NOTE — Patient Outreach (Signed)
[place] [facility]) Care Management  07/27/2020  Lisa Haley 06/03/1976 [id number]   Lisa Haley 1976/06/25 [id number]   EMMI- stroke  RED ON EMMI ALERT Day #3 Date:07/17/20 Red Alert Reason:Questions /problems with medications ?, Yes  [facility] Admission 12/28-07/13/20 Dx: Subarachnoid hemorrhage  Outreach attempt #3//follow up call    Subjective Successful follow up call with patient , she reports that she is doing some better, improvement with headache. She reports neurology provider has provided refill on pain medication, reinforced taking as prescribed.  She states she has scheduled office appointment for 1/17 office cancelled due to weather and rescheduled for today, she states due to weather she is not able to attend and has placed call to office to reschedule.  Patient denies any other concerns at this time, no seizure activity noted and taking medications as prescribed.    Plan Patient agreeable to  call in the next 4 days for follow up on scheduling office visit with neurology.    Joylene Draft, RN, BSN  [place] Management Coordinator  [phone]- Mobile [phone]- Toll Free Main Office

## 2020-07-30 ENCOUNTER — Other Ambulatory Visit: Payer: Self-pay | Admitting: *Deleted

## 2020-07-30 NOTE — Patient Outreach (Signed)
[place] [facility]) Care Management  07/30/2020  Lisa Haley [birth year] [id number]   Lisa Haley 1975-09-30 [id number]   EMMI- stroke  RED ON EMMI ALERT Day #3 Date:07/17/20 Red Alert Reason:Questions /problems with medications ?, Yes  [facility] Admission 12/28-07/13/20 Dx: Subarachnoid   Subjective: Successful outreach follow up call to patient , She reports doing good, states headache is much better and managed with prn medications.  Patient reports that she has been able to reschedule her appointment with neurology to Monday /24 and has Hospital follow up at [facility] on 1/28. She denies any other concerns at this time.    Plan  Will plan case closure, EMMI red flag alert resolved,no other  care management coordination needs identified at this time.   Joylene Draft, RN, BSN  [place] Management Coordinator  [phone]- Mobile [phone]- Toll Free Main Office

## 2020-08-03 ENCOUNTER — Other Ambulatory Visit (HOSPITAL_COMMUNITY): Payer: Self-pay | Admitting: Physician Assistant

## 2020-08-03 DIAGNOSIS — I609 Nontraumatic subarachnoid hemorrhage, unspecified: Secondary | ICD-10-CM

## 2020-08-06 ENCOUNTER — Other Ambulatory Visit: Payer: Self-pay | Admitting: Internal Medicine

## 2020-08-06 ENCOUNTER — Other Ambulatory Visit: Payer: Self-pay

## 2020-08-06 ENCOUNTER — Ambulatory Visit (HOSPITAL_BASED_OUTPATIENT_CLINIC_OR_DEPARTMENT_OTHER): Payer: Medicaid Other | Admitting: Pharmacist

## 2020-08-06 ENCOUNTER — Encounter: Payer: Self-pay | Admitting: Internal Medicine

## 2020-08-06 ENCOUNTER — Inpatient Hospital Stay: Payer: Medicaid Other | Admitting: Internal Medicine

## 2020-08-06 ENCOUNTER — Ambulatory Visit: Payer: Self-pay | Attending: Internal Medicine | Admitting: Internal Medicine

## 2020-08-06 VITALS — BP 160/100 | HR 109 | Temp 98.1°F | Resp 16 | Ht 62.0 in | Wt 267.8 lb

## 2020-08-06 DIAGNOSIS — Z23 Encounter for immunization: Secondary | ICD-10-CM

## 2020-08-06 DIAGNOSIS — I609 Nontraumatic subarachnoid hemorrhage, unspecified: Secondary | ICD-10-CM

## 2020-08-06 DIAGNOSIS — Z6841 Body Mass Index (BMI) 40.0 and over, adult: Secondary | ICD-10-CM | POA: Insufficient documentation

## 2020-08-06 DIAGNOSIS — F17200 Nicotine dependence, unspecified, uncomplicated: Secondary | ICD-10-CM

## 2020-08-06 DIAGNOSIS — I10 Essential (primary) hypertension: Secondary | ICD-10-CM

## 2020-08-06 DIAGNOSIS — G43009 Migraine without aura, not intractable, without status migrainosus: Secondary | ICD-10-CM

## 2020-08-06 DIAGNOSIS — Z09 Encounter for follow-up examination after completed treatment for conditions other than malignant neoplasm: Secondary | ICD-10-CM

## 2020-08-06 MED ORDER — AMLODIPINE BESYLATE 5 MG PO TABS: 5.0000 mg | ORAL_TABLET | Freq: Every day | ORAL | 3 refills | Status: DC

## 2020-08-06 MED ORDER — LEVETIRACETAM 500 MG PO TABS
500.0000 mg | ORAL_TABLET | Freq: Two times a day (BID) | ORAL | 3 refills | Status: DC
Start: 2020-08-06 — End: 2020-08-06

## 2020-08-06 MED FILL — ACETAMINOPHEN-COD #3 TABLET: 300-30 | 4 days supply | Qty: 42 | Fill #0

## 2020-08-06 MED FILL — AMLODIPINE BESYLATE 5 MG TA: 5 | 30 days supply | Qty: 30 | Fill #0

## 2020-08-06 NOTE — Progress Notes (Signed)
Patient presents for vaccination against influenza per orders of Dr. Wynetta Emery. Consent given. Counseling provided. No contraindications exists. Vaccine administered without incident.   Benard Halsted, PharmD, [place], [place] [phone]

## 2020-08-06 NOTE — Patient Instructions (Signed)
Influenza Virus Vaccine injection (Fluarix) What is this medicine? INFLUENZA VIRUS VACCINE (in floo EN zuh VAHY ruhs vak SEEN) helps to reduce the risk of getting influenza also known as the flu. This medicine may be used for other purposes; ask your health care provider or pharmacist if you have questions. COMMON BRAND NAME(S): Fluarix, Fluzone What should I tell my health care provider before I take this medicine? They need to know if you have any of these conditions:  bleeding disorder like hemophilia  fever or infection  Guillain-Barre syndrome or other neurological problems  immune system problems  infection with the human immunodeficiency virus (HIV) or AIDS  low blood platelet counts  multiple sclerosis  an unusual or allergic reaction to influenza virus vaccine, eggs, chicken proteins, latex, gentamicin, other medicines, foods, dyes or preservatives  pregnant or trying to get pregnant  breast-feeding How should I use this medicine? This vaccine is for injection into a muscle. It is given by a health care professional. A copy of Vaccine Information Statements will be given before each vaccination. Read this sheet carefully each time. The sheet may change frequently. Talk to your pediatrician regarding the use of this medicine in children. Special care may be needed. Overdosage: If you think you have taken too much of this medicine contact a poison control center or emergency room at once. NOTE: This medicine is only for you. Do not share this medicine with others. What if I miss a dose? This does not apply. What may interact with this medicine?  chemotherapy or radiation therapy  medicines that lower your immune system like etanercept, anakinra, infliximab, and adalimumab  medicines that treat or prevent blood clots like warfarin  phenytoin  steroid medicines like prednisone or cortisone  theophylline  vaccines This list may not describe all possible  interactions. Give your health care provider a list of all the medicines, herbs, non-prescription drugs, or dietary supplements you use. Also tell them if you smoke, drink alcohol, or use illegal drugs. Some items may interact with your medicine. What should I watch for while using this medicine? Report any side effects that do not go away within 3 days to your doctor or health care professional. Call your health care provider if any unusual symptoms occur within 6 weeks of receiving this vaccine. You may still catch the flu, but the illness is not usually as bad. You cannot get the flu from the vaccine. The vaccine will not protect against colds or other illnesses that may cause fever. The vaccine is needed every year. What side effects may I notice from receiving this medicine? Side effects that you should report to your doctor or health care professional as soon as possible:  allergic reactions like skin rash, itching or hives, swelling of the face, lips, or tongue Side effects that usually do not require medical attention (report to your doctor or health care professional if they continue or are bothersome):  fever  headache  muscle aches and pains  pain, tenderness, redness, or swelling at site where injected  weak or tired This list may not describe all possible side effects. Call your doctor for medical advice about side effects. You may report side effects to FDA at 1-800-FDA-1088. Where should I keep my medicine? This vaccine is only given in a clinic, pharmacy, doctor's office, or other health care setting and will not be stored at home. NOTE: This sheet is a summary. It may not cover all possible information. If you have questions   about this medicine, talk to your doctor, pharmacist, or health care provider.  2021 Elsevier/Gold Standard (2008-01-22 09:30:40)  

## 2020-08-06 NOTE — Progress Notes (Signed)
Patient ID: Lisa Haley, female    DOB: 22-Dec-1975  MRN: [id number]  CC: Hospitalization Follow-up   Subjective: Lisa Haley is a 45 y.o. female who presents for hosp f/u and new patient visit Her concerns today include:  Patient with history of subarachnoid hemorrhage, tob dep, HTN  Patient reports that her previous PCP was Dr. Marene Lenz with [facility]. Last seen 2 years ago  Patient hospitalized 07/06/2020-07/13/2020 with complaints of severe headache and new onset seizure witnessed by family. She was found to have subarachnoid hemorrhage on CAT scan of the head. She had angiogram x2 which showed no evidence of arterial malformation including aneurysm. She was monitored in the neuro ICU for 8 days and discharged home in a stable condition on Keppra.  Today: She continues to have headaches on the right side in the occipital area. The headaches are not continuous. She states she may have a day where she does not have a headache. She saw the neurosurgeon Dr. Nundkumar 4 days ago and reports being told that it can take 1 to 2 months for the headaches to get better. He plans to see her again in 2 months and will reimage at that time. He refilled Tylenol with codeine for her to use as needed for the headaches. Headaches are associated with sensitivity to lights and sound. She reports no nausea or vomiting with the headache or blurred vision.  Noted to have elevated blood pressure today. Reports history of hypertension but has been off medicines for 2 years due to lack of insurance. She tries to limit salt in the foods.  Tobacco dependence: She states that she is trying to quit smoking. Previously she was smoking a half a pack a day. Now she is down to 2 cigarettes a day.  Obesity: States that she has been making some changes in her eating habits. She has cut out eating pork and eating smaller portions.  Past medical, surgical, family history and social history reviewed. Patient  Active Problem List   Diagnosis Date Noted  . SAH (subarachnoid hemorrhage) ([place]) 07/06/2020     Current Outpatient Medications on File Prior to Visit  Medication Sig Dispense Refill  . acetaminophen (TYLENOL) 500 MG tablet Take 1,000 mg by mouth every 6 (six) hours as needed for moderate pain or headache.    [name] acetaminophen-codeine (TYLENOL #3) 300-30 MG tablet Take 1-2 tablets by mouth every 4 (four) hours as needed for moderate pain. 42 tablet 0  . ibuprofen (ADVIL) 200 MG tablet Take 400 mg by mouth every 6 (six) hours as needed for headache or moderate pain. (Patient not taking: Reported on 07/19/2020)     No current facility-administered medications on file prior to visit.    No Known Allergies  Social History   Socioeconomic History  . Marital status: Single    Spouse name: Not on file  . Number of children: Not on file  . Years of education: Not on file  . Highest education level: Not on file  Occupational History  . Not on file  Tobacco Use  . Smoking status: Current Every Day Smoker    Packs/day: 0.50    Types: Cigarettes  . Smokeless tobacco: Never Used  Substance and Sexual Activity  . Alcohol use: Yes    Comment: once a week  . Drug use: No  . Sexual activity: Yes    Birth control/protection: Surgical  Other Topics Concern  . Not on file  Social History Narrative  . Not  on file   Social Determinants of Health   Financial Resource Strain: Not on file  Food Insecurity: Not on file  Transportation Needs: Not on file  Physical Activity: Not on file  Stress: Not on file  Social Connections: Not on file  Intimate Partner Violence: Not on file    No family history on file.  Past Surgical History:  Procedure Laterality Date  . ABDOMINAL HYSTERECTOMY    . IR ANGIO INTRA EXTRACRAN SEL INTERNAL CAROTID BILAT MOD SED  07/06/2020  . IR ANGIO INTRA EXTRACRAN SEL INTERNAL CAROTID BILAT MOD SED  07/12/2020  . IR ANGIO VERTEBRAL SEL VERTEBRAL BILAT MOD SED   07/06/2020  . IR ANGIO VERTEBRAL SEL VERTEBRAL BILAT MOD SED  07/12/2020  . RADIOLOGY WITH ANESTHESIA N/A 07/06/2020   Procedure: IR WITH ANESTHESIA;  Surgeon: Consuella Lose, MD;  Location: [place];  Service: Radiology;  Laterality: N/A;  . TUBAL LIGATION      ROS: Review of Systems Negative except as stated above  PHYSICAL EXAM: BP (!) 160/100   Pulse (!) 109   Temp 98.1 F (36.7 C)   Resp 16   Ht 5\' 2"  (1.575 m)   Wt 267 lb 12.8 oz (121.5 kg)   SpO2 95%   BMI 48.98 kg/m   Physical Exam  General appearance - alert, well appearing, obese middle-aged African-American female and in no distress Mental status - normal mood, behavior, speech, dress, motor activity, and thought processes Neck - supple, no significant adenopathy Chest - clear to auscultation, no wheezes, rales or rhonchi, symmetric air entry Heart - normal rate, regular rhythm, normal S1, S2, no murmurs, rubs, clicks or gallops Neurological -cranial nerves are grossly intact. Power in all fours 5/5 bilaterally. Gross sensation intact. Gait is normal. Extremities -no lower extremity edema   CMP Latest Ref Rng & Units 07/06/2020 07/06/2020 08/08/2016  Glucose 70 - 99 mg/dL 223(H) 249(H) 106(H)  BUN 6 - 20 mg/dL 14 13 5(L)  Creatinine 0.44 - 1.00 mg/dL 0.80 1.05(H) 1.10(H)  Sodium 135 - 145 mmol/L 137 134(L) 136  Potassium 3.5 - 5.1 mmol/L 3.8 3.6 3.9  Chloride 98 - 111 mmol/L 103 103 104  CO2 22 - 32 mmol/L - 19(L) 26  Calcium 8.9 - 10.3 mg/dL - 8.5(L) 8.5(L)  Total Protein 6.5 - 8.1 g/dL - 6.1(L) 6.0(L)  Total Bilirubin 0.3 - 1.2 mg/dL - 0.4 0.3  Alkaline Phos 38 - 126 U/L - 61 46  AST 15 - 41 U/L - 32 24  ALT 0 - 44 U/L - 25 23   Lipid Panel     Component Value Date/Time   TRIG 80 07/11/2020 0030    CBC    Component Value Date/Time   WBC 12.9 (H) 07/06/2020 0305   RBC 4.20 07/06/2020 0305   HGB 15.6 (H) 07/06/2020 0452   HCT 46.0 07/06/2020 0452   PLT 376 07/06/2020 0305   MCV 97.6 07/06/2020  0305   MCH 31.2 07/06/2020 0305   MCHC 32.0 07/06/2020 0305   RDW 14.0 07/06/2020 0305   LYMPHSABS 3.8 07/06/2020 0305   MONOABS 0.9 07/06/2020 0305   EOSABS 0.3 07/06/2020 0305   BASOSABS 0.1 07/06/2020 0305    ASSESSMENT AND PLAN: 1. Hospital discharge follow-up 2. SAH (subarachnoid hemorrhage) ([place]) Patient has seen the neurosurgeon in follow-up already and will be seen again in 2 months. I will also try to get her in with neurology. Refill given on Keppra. - levETIRAcetam (KEPPRA) 500 MG tablet; Take  1 tablet (500 mg total) by mouth 2 (two) times daily.  Dispense: 60 tablet; Refill: 3 - Ambulatory referral to Neurology  3. Migraine without aura and without status migrainosus, not intractable Associated with subarachnoid hemorrhage. She is taking Tylenol with codeine as needed from the neurosurgeon. She states when she has the headache she takes 2 tablets every 6 hours but not every day. - Ambulatory referral to Neurology  4. Essential hypertension Not at goal. Stopped amlodipine. Follow-up with clinical pharmacist in 2 weeks for repeat blood pressure check - amLODipine (NORVASC) 5 MG tablet; Take 1 tablet (5 mg total) by mouth daily.  Dispense: 30 tablet; Refill: 3  5. Tobacco dependence Advised to quit. Discussed health risks associated with smoking. Trying to quit. I have commended her on cutting down. Encouraged her to set a quit date. Less than 5 minutes spent on counseling.  6. Morbid obesity ([place]) Dietary counseling given. Advised to eliminate sugary drinks from the diet. Encouraged to cut back on portion sizes of white carbohydrates, more lean white meat than red meat and incorporate fresh fruits and vegetables into the diet daily.  7. Need for influenza vaccination Given   Patient was given the opportunity to ask questions.  Patient verbalized understanding of the plan and was able to repeat key elements of the plan.   Orders Placed This Encounter  Procedures  .  Ambulatory referral to Neurology     Requested Prescriptions   Signed Prescriptions Disp Refills  . amLODipine (NORVASC) 5 MG tablet 30 tablet 3    Sig: Take 1 tablet (5 mg total) by mouth daily.  [name] levETIRAcetam (KEPPRA) 500 MG tablet 60 tablet 3    Sig: Take 1 tablet (500 mg total) by mouth 2 (two) times daily.    Return in about 6 weeks (around 09/17/2020) for Give appt with [facility] in 1 wk for BP check.  Karle Plumber, MD, FACP

## 2020-08-13 ENCOUNTER — Ambulatory Visit: Payer: Medicaid Other | Admitting: Pharmacist

## 2020-08-18 ENCOUNTER — Encounter: Payer: Self-pay | Admitting: Neurology

## 2020-08-18 ENCOUNTER — Telehealth: Payer: Self-pay

## 2020-08-18 MED FILL — ACETAMINOPHEN-COD #3 TABLET: 300-30 | 4 days supply | Qty: 42 | Fill #0

## 2020-08-18 NOTE — Telephone Encounter (Signed)
Will forward to provider  

## 2020-08-18 NOTE — Telephone Encounter (Signed)
Copied from [place] [id number]. Topic: Referral - Status >> Aug 18, 2020 10:06 AM Yvette Rack wrote: Reason for CRM: Pt stated she was advised by the neurology office that they would only be able to see her regarding the migraine headaches. Pt stated they are not going to see her regarding the stroke she had. Pt requests call back  Patient saw Wynetta Emery for [place]. Please advise.

## 2020-08-23 NOTE — Telephone Encounter (Signed)
Contacted pt and went over Dr. Wynetta Emery message pt is aware and doesn't have any questions or concerns

## 2020-08-24 NOTE — Progress Notes (Unsigned)
   S:     PCP: Dr. Wynetta Emery PMH: subarachnoid hemorrhage (2021), tob dep, HTN, seizure  Patient arrives in good spirits. Presents to the clinic for hypertension evaluation, counseling, and management.  Patient was referred and last seen by Primary Care Provider on 08/06/20 and BP elevated at 160/100 with HR 109. Reported off of amlodipine for 2 years due to lack of insurance.   Today, patient reports ***   Compliance? Took meds this morning? When do you take your meds? Dizziness, headaches, blurred vision? -headaches on the right side in the occipital area - not continuous, comes and goes - neurosurgeon said it may take 1-2 months for headaches to improve. Headaches are associated with sensitivity to lights and sound. History of swelling? Check Clinic BP? Home BP logs? If no logs, bring to next visit w/ BP cuff Go over BP goals Additional BP therapy if needed . Increase amlodipine to 10 mg daily Diet??  Exercise??    Medication adherence *** .  Current BP Medications include: amlodipine 5 mg daily   Dietary habits include: *** Exercise habits include: limits salt intake, cut out eating pork and eating smaller portions. Family / Social history:  -Fhx: -Tobacco use: current every day smoker (0.5 ppd - now down to 2 cigs/day)  O:   Home BP readings: ***  Last 3 Office BP readings: BP Readings from Last 3 Encounters:  08/06/20 (!) 160/100  07/13/20 (!) 119/96  09/23/18 (!) 156/94    BMET    Component Value Date/Time   NA 137 07/06/2020 0452   K 3.8 07/06/2020 0452   CL 103 07/06/2020 0452   CO2 19 (L) 07/06/2020 0305   GLUCOSE 223 (H) 07/06/2020 0452   BUN 14 07/06/2020 0452   CREATININE 0.80 07/06/2020 0452   CALCIUM 8.5 (L) 07/06/2020 0305   GFRNONAA >60 07/06/2020 0305   GFRAA >60 08/08/2016 1313    Renal function: CrCl cannot be calculated (Patient's most recent lab result is older than the maximum 21 days allowed.).  Clinical ASCVD: {YES/NO:21197} The  ASCVD Risk score Mikey Bussing DC Jr., et al., 2013) failed to calculate for the following reasons:   Cannot find a previous HDL lab   Cannot find a previous total cholesterol lab   A/P: Hypertension diagnosed *** currently *** on current medications. BP Goal = < *** mmHg. Medication adherence ***.  -{Meds adjust:18428} ***.  -F/u labs ordered - *** -Counseled on lifestyle modifications for blood pressure control including reduced dietary sodium, increased exercise, adequate sleep.  Results reviewed and written information provided.   Total time in face-to-face counseling *** minutes.   F/U Clinic Visit in ***.    Lorel Monaco, PharmD, [place] PGY2 Ambulatory Care Resident [place]

## 2020-08-25 ENCOUNTER — Ambulatory Visit: Payer: Medicaid Other | Admitting: Pharmacist

## 2020-08-27 ENCOUNTER — Ambulatory Visit (HOSPITAL_COMMUNITY): Admission: RE | Admit: 2020-08-27 | Payer: Self-pay | Source: Ambulatory Visit

## 2020-08-27 ENCOUNTER — Encounter (HOSPITAL_COMMUNITY): Payer: Self-pay

## 2020-09-01 ENCOUNTER — Other Ambulatory Visit (HOSPITAL_COMMUNITY): Payer: Self-pay | Admitting: Physician Assistant

## 2020-09-09 ENCOUNTER — Ambulatory Visit (HOSPITAL_COMMUNITY)
Admission: RE | Admit: 2020-09-09 | Discharge: 2020-09-09 | Disposition: A | Discharge status: Home / Self Care | Payer: Self-pay | Source: Ambulatory Visit | Attending: Physician Assistant | Admitting: Physician Assistant

## 2020-09-09 ENCOUNTER — Other Ambulatory Visit: Payer: Self-pay

## 2020-09-09 DIAGNOSIS — I609 Nontraumatic subarachnoid hemorrhage, unspecified: Secondary | ICD-10-CM | POA: Insufficient documentation

## 2020-09-09 MED ORDER — GADOBUTROL 1 MMOL/ML IV SOLN: 12.0000 mL | Freq: Once | INTRAVENOUS | Status: DC | PRN

## 2020-10-19 NOTE — Progress Notes (Deleted)
NEUROLOGY CONSULTATION NOTE  Lisa Haley MRN: [id number] DOB: ***  Referring provider: *** Primary care provider: ***  Reason for consult:  ***  Assessment/Plan:   ***   Subjective:  ***   On 07/06/2020, she had a subarachnoid hemorrhage presenting as severe headache and seizure.  Admitted to [facility].  CT/CTA of head showed large amount of subarachnoid blood without hydrocephalus, midline shift or aneurysm.Lisa Haley  Underwent cerebral angiogram x 2 which did not demonstrate aneurysm or other vascular cause for [facility].  Started on Keppra and monitored in ICU.  Discharged 8 days later in stable condition.  Since then, she continues to have headaches.  ***.  She had been treating with Tylenol with codeine ***.  Follow up MRA of head on 09/09/2020 personally reviewed was normal.    Current NSAIDS/analgesics:  Tylenol with codeine, Fioricet Current triptans:  none Current ergotamine:  none Current anti-emetic:  none Current muscle relaxants:  none Current Antihypertensive medications:  amlodipine Current Antidepressant medications:  none Current Anticonvulsant medications:  Keppra 500mg  BID Current anti-CGRP:  none Current Vitamins/Herbal/Supplements:  none Current Antihistamines/Decongestants:  none Other therapy:  *** Hormone/birth control:  none   Past NSAIDS/analgesics:  *** Past abortive triptans:  *** Past abortive ergotamine:  *** Past muscle relaxants:  *** Past anti-emetic:  *** Past antihypertensive medications:  *** Past antidepressant medications:  *** Past anticonvulsant medications:  *** Past anti-CGRP:  *** Past vitamins/Herbal/Supplements:  *** Past antihistamines/decongestants:  *** Other past therapies:  ***  Caffeine:  *** Alcohol:  *** Smoker:  *** Diet:  *** Exercise:  *** Depression:  ***; Anxiety:  *** Other pain:  *** Sleep hygiene:  *** Family history of headache:  ***   PAST MEDICAL HISTORY: Past Medical History:  Diagnosis  Date  . Hypertension   . Obesity     PAST SURGICAL HISTORY: Past Surgical History:  Procedure Laterality Date  . ABDOMINAL HYSTERECTOMY    . IR ANGIO INTRA EXTRACRAN SEL INTERNAL CAROTID BILAT MOD SED  07/06/2020  . IR ANGIO INTRA EXTRACRAN SEL INTERNAL CAROTID BILAT MOD SED  07/12/2020  . IR ANGIO VERTEBRAL SEL VERTEBRAL BILAT MOD SED  07/06/2020  . IR ANGIO VERTEBRAL SEL VERTEBRAL BILAT MOD SED  07/12/2020  . RADIOLOGY WITH ANESTHESIA N/A 07/06/2020   Procedure: IR WITH ANESTHESIA;  Surgeon: Consuella Lose, MD;  Location: [place];  Service: Radiology;  Laterality: N/A;  . TUBAL LIGATION      MEDICATIONS: Current Outpatient Medications on File Prior to Visit  Medication Sig Dispense Refill  . acetaminophen (TYLENOL) 500 MG tablet Take 1,000 mg by mouth every 6 (six) hours as needed for moderate pain or headache.    Lisa Haley acetaminophen-codeine (TYLENOL #3) 300-30 MG tablet TAKE 1-2 TABLETS BY MOUTH EVERY FOUR HOURS AS NEEDED FOR MODERATE PAIN. 42 tablet 0  . acetaminophen-codeine (TYLENOL #3) 300-30 MG tablet TAKE 1 TO 2 TABLETS BY MOUTH EVERY 4 HOURS AS NEEDED FOR PAIN 42 tablet 0  . acetaminophen-codeine (TYLENOL #3) 300-30 MG tablet TAKE 1-2 TABLET(S) BY MOUTH EVERY 4 HOURS AS NEEDED FOR PAIN 42 tablet 0  . amLODipine (NORVASC) 5 MG tablet TAKE 1 TABLET (5 MG TOTAL) BY MOUTH DAILY. 30 tablet 3  . Butalbital-APAP-Caffeine 50-300-40 MG CAPS TAKE 1 - 2 CAPSULES BY MOUTH EVERY 6 HOURS AS NEEDED (NOT TO EXCEED 6 CAPSULES PER 24HRS) 30 capsule 0  . ibuprofen (ADVIL) 200 MG tablet Take 400 mg by mouth every 6 (six) hours as needed for headache  or moderate pain. (Patient not taking: Reported on 07/19/2020)    . levETIRAcetam (KEPPRA) 500 MG tablet TAKE 1 TABLET (500 MG TOTAL) BY MOUTH 2 (TWO) TIMES DAILY. 60 tablet 3   No current facility-administered medications on file prior to visit.    ALLERGIES: No Known Allergies  FAMILY HISTORY: No family history on file.  Objective:  *** General:  No acute distress.  Patient appears well-groomed.   Head:  Normocephalic/atraumatic Eyes:  fundi examined but not visualized Neck: supple, no paraspinal tenderness, full range of motion Back: No paraspinal tenderness Heart: regular rate and rhythm Lungs: Clear to auscultation bilaterally. Vascular: No carotid bruits. Neurological Exam: Mental status: alert and oriented to person, place, and time, recent and remote memory intact, fund of knowledge intact, attention and concentration intact, speech fluent and not dysarthric, language intact. Cranial nerves: CN I: not tested CN II: pupils equal, round and reactive to light, visual fields intact CN III, IV, VI:  full range of motion, no nystagmus, no ptosis CN V: facial sensation intact. CN VII: upper and lower face symmetric CN VIII: hearing intact CN IX, X: gag intact, uvula midline CN XI: sternocleidomastoid and trapezius muscles intact CN XII: tongue midline Bulk & Tone: normal, no fasciculations. Motor:  muscle strength 5/5 throughout Sensation:  Pinprick, temperature and vibratory sensation intact. Deep Tendon Reflexes:  2+ throughout,  toes downgoing.   Finger to nose testing:  Without dysmetria.   Heel to shin:  Without dysmetria.   Gait:  Normal station and stride.  Romberg negative.    Thank you for allowing me to take part in the care of this patient.  [name], DO  CC: ***

## 2020-10-21 ENCOUNTER — Ambulatory Visit: Payer: Medicaid Other | Admitting: Neurology

## 2020-12-07 ENCOUNTER — Encounter (HOSPITAL_COMMUNITY): Payer: Self-pay

## 2020-12-07 ENCOUNTER — Emergency Department (HOSPITAL_COMMUNITY)
Admission: EM | Admit: 2020-12-07 | Discharge: 2020-12-07 | Disposition: A | Discharge status: Home / Self Care | Payer: Medicaid Other | Attending: Emergency Medicine | Admitting: Emergency Medicine

## 2020-12-07 ENCOUNTER — Other Ambulatory Visit: Payer: Self-pay

## 2020-12-07 DIAGNOSIS — Z5321 Procedure and treatment not carried out due to patient leaving prior to being seen by health care provider: Secondary | ICD-10-CM | POA: Insufficient documentation

## 2020-12-07 DIAGNOSIS — R1030 Lower abdominal pain, unspecified: Secondary | ICD-10-CM | POA: Insufficient documentation

## 2020-12-07 DIAGNOSIS — K625 Hemorrhage of anus and rectum: Secondary | ICD-10-CM | POA: Insufficient documentation

## 2020-12-07 HISTORY — DX: Cerebral infarction, unspecified: I63.9

## 2020-12-07 LAB — CBC WITH DIFFERENTIAL/PLATELET
Abs Immature Granulocytes: 0.04 10*3/uL (ref 0.00–0.07)
Basophils Absolute: 0.1 10*3/uL (ref 0.0–0.1)
Basophils Relative: 1 %
Eosinophils Absolute: 0.5 10*3/uL (ref 0.0–0.5)
Eosinophils Relative: 6 %
HCT: 42.4 % (ref 36.0–46.0)
Hemoglobin: 13.9 g/dL (ref 12.0–15.0)
Immature Granulocytes: 1 %
Lymphocytes Relative: 31 %
Lymphs Abs: 2.4 10*3/uL (ref 0.7–4.0)
MCH: 32.3 pg (ref 26.0–34.0)
MCHC: 32.8 g/dL (ref 30.0–36.0)
MCV: 98.4 fL (ref 80.0–100.0)
Monocytes Absolute: 0.7 10*3/uL (ref 0.1–1.0)
Monocytes Relative: 9 %
Neutro Abs: 4.1 10*3/uL (ref 1.7–7.7)
Neutrophils Relative %: 52 %
Platelets: 298 10*3/uL (ref 150–400)
RBC: 4.31 MIL/uL (ref 3.87–5.11)
RDW: 13.6 % (ref 11.5–15.5)
WBC: 7.9 10*3/uL (ref 4.0–10.5)
nRBC: 0 % (ref 0.0–0.2)

## 2020-12-07 LAB — COMPREHENSIVE METABOLIC PANEL
ALT: 15 U/L (ref 0–44)
AST: 17 U/L (ref 15–41)
Albumin: 3.2 g/dL — ABNORMAL LOW (ref 3.5–5.0)
Alkaline Phosphatase: 54 U/L (ref 38–126)
Anion gap: 3 — ABNORMAL LOW (ref 5–15)
BUN: 15 mg/dL (ref 6–20)
CO2: 24 mmol/L (ref 22–32)
Calcium: 8.8 mg/dL — ABNORMAL LOW (ref 8.9–10.3)
Chloride: 111 mmol/L (ref 98–111)
Creatinine, Ser: 1.01 mg/dL — ABNORMAL HIGH (ref 0.44–1.00)
GFR, Estimated: >60 mL/min (ref >60)
Glucose, Bld: 90 mg/dL (ref 70–99)
Potassium: 5.5 mmol/L — ABNORMAL HIGH (ref 3.5–5.1)
Sodium: 138 mmol/L (ref 135–145)
Total Bilirubin: 0.5 mg/dL (ref 0.3–1.2)
Total Protein: 5.9 g/dL — ABNORMAL LOW (ref 6.5–8.1)

## 2020-12-07 LAB — TYPE AND SCREEN
ABO/RH(D): A POS
Antibody Screen: NEGATIVE

## 2020-12-07 LAB — LIPASE, BLOOD: Lipase: 31 U/L (ref 11–51)

## 2020-12-07 NOTE — ED Notes (Signed)
Pt called X 3 for vitals. No answer.

## 2020-12-07 NOTE — ED Provider Notes (Signed)
Emergency Medicine Provider Triage Evaluation Note  Lisa Haley , a 45 y.o. female  was evaluated in triage.  Pt complains of bloody bowel movements for the last 2 weeks.  Intermittent abdominal cramping  Review of Systems  Positive: Abdominal cramping, rectal bleeding Negative: Fever, vomiting  Physical Exam  BP (!) 161/92 (BP Location: Right Arm)   Pulse 85   Temp 98.2 F (36.8 C) (Oral)   Resp 18   SpO2 95%  Gen:   Awake, no distress   Resp:  Normal effort  MSK:   Moves extremities without difficulty  Other:    Medical Decision Making  Medically screening exam initiated at 11:30 AM.  Appropriate orders placed.  [name] was informed that the remainder of the evaluation will be completed by another provider, this initial triage assessment does not replace that evaluation, and the importance of remaining in the ED until their evaluation is complete.     Lorayne Bender, PA-C 12/07/20 1141    Isla Pence, MD 12/07/20 1308

## 2020-12-07 NOTE — ED Notes (Signed)
Pt's called to have vitals reassessed. No answer. Will call again.

## 2020-12-07 NOTE — ED Triage Notes (Signed)
Patient complains of intermittent lower abdominal pain for 2 weeks and reports rectal bleeding with bowel movements. Patient does not take thinners. Alert and oriented.

## 2020-12-07 NOTE — ED Notes (Signed)
Pt called again for vitals. No answer. Pt will be pulled OTF.

## 2020-12-30 ENCOUNTER — Ambulatory Visit: Payer: Medicaid Other | Admitting: Physician Assistant

## 2021-01-06 ENCOUNTER — Ambulatory Visit: Payer: Self-pay | Attending: Physician Assistant | Admitting: Physician Assistant

## 2021-01-06 ENCOUNTER — Other Ambulatory Visit: Payer: Self-pay

## 2021-01-06 ENCOUNTER — Encounter: Payer: Self-pay | Admitting: Physician Assistant

## 2021-01-06 DIAGNOSIS — I10 Essential (primary) hypertension: Secondary | ICD-10-CM

## 2021-01-06 DIAGNOSIS — E875 Hyperkalemia: Secondary | ICD-10-CM

## 2021-01-06 DIAGNOSIS — Z09 Encounter for follow-up examination after completed treatment for conditions other than malignant neoplasm: Secondary | ICD-10-CM

## 2021-01-06 DIAGNOSIS — Z1231 Encounter for screening mammogram for malignant neoplasm of breast: Secondary | ICD-10-CM

## 2021-01-06 DIAGNOSIS — I609 Nontraumatic subarachnoid hemorrhage, unspecified: Secondary | ICD-10-CM

## 2021-01-06 DIAGNOSIS — M25552 Pain in left hip: Secondary | ICD-10-CM

## 2021-01-06 MED ORDER — AMLODIPINE BESYLATE 5 MG PO TABS
ORAL_TABLET | Freq: Every day | ORAL | 3 refills | Status: DC
  Filled 2021-01-06 – 2021-03-01 (×2): qty 30, 30d supply, fill #0

## 2021-01-06 MED ORDER — LEVETIRACETAM 500 MG PO TABS
ORAL_TABLET | Freq: Two times a day (BID) | ORAL | 3 refills | Status: DC
Start: 2021-01-06 — End: 2021-04-21
  Filled 2021-01-06 – 2021-03-01 (×2): qty 60, 30d supply, fill #0

## 2021-01-06 MED ORDER — MELOXICAM 7.5 MG PO TABS
7.5000 mg | ORAL_TABLET | Freq: Every day | ORAL | 0 refills | Status: DC
  Filled 2021-01-06 – 2021-03-01 (×2): qty 30, 30d supply, fill #0

## 2021-01-06 MED ORDER — TRAMADOL HCL 50 MG PO TABS
50.0000 mg | ORAL_TABLET | Freq: Three times a day (TID) | ORAL | 0 refills | Status: AC | PRN
  Filled 2021-01-06 – 2021-03-01 (×2): qty 15, 5d supply, fill #0

## 2021-01-06 NOTE — Progress Notes (Signed)
Patient ID: Lisa Haley, female   DOB: 1976/03/21, 45 y.o.   MRN: [id number] Virtual Visit via Telephone Note  I connected with Lisa Haley on 01/06/21 at 11:10 AM EDT by telephone and verified that I am speaking with the correct person using two identifiers.  Location: Patient: home Provider: [facility] office   I discussed the limitations, risks, security and privacy concerns of performing an evaluation and management service by telephone and the availability of in person appointments. I also discussed with the patient that there may be a patient responsible charge related to this service. The patient expressed understanding and agreed to proceed.   History of Present Illness: In ED for abdominal pain 12/07/2020.  That has improved.  She c/o various ongoing joint pains including hip and shoulder.  Has no insurance.  Not taking blood pressure or seizure meds bc of cost.  Wants to get a mammogram-she has never had one.  Received financial packet in the past but did not complete it.     Observations/Objective: NAD.  A&Ox3  Assessment and Plan: 1. Left hip pain Will refer once financial in place - traMADol (ULTRAM) 50 MG tablet; Take 1 tablet (50 mg total) by mouth every 8 (eight) hours as needed for up to 5 days.  Dispense: 15 tablet; Refill: 0 - meloxicam (MOBIC) 7.5 MG tablet; Take 1 tablet (7.5 mg total) by mouth daily. Prn pain  Dispense: 30 tablet; Refill: 0  2. Essential hypertension Take meds!!!! - amLODipine (NORVASC) 5 MG tablet; TAKE 1 TABLET (5 MG TOTAL) BY MOUTH DAILY.  Dispense: 30 tablet; Refill: 3 - Comprehensive metabolic panel  3. SAH (subarachnoid hemorrhage) (HCC) - levETIRAcetam (KEPPRA) 500 MG tablet; TAKE 1 TABLET (500 MG TOTAL) BY MOUTH 2 (TWO) TIMES DAILY.  Dispense: 60 tablet; Refill: 3  4. Hyperkalemia - Comprehensive metabolic panel  5. Hospital discharge follow-up  6. Screening mammogram, encounter for - MM Digital Screening; Future   Follow  Up Instructions: Schedule mammogram, mail financial packet-see PCP in 3 months   I discussed the assessment and treatment plan with the patient. The patient was provided an opportunity to ask questions and all were answered. The patient agreed with the plan and demonstrated an understanding of the instructions.   The patient was advised to call back or seek an in-person evaluation if the symptoms worsen or if the condition fails to improve as anticipated.  I provided 14 minutes of non-face-to-face time during this encounter.   Freeman Caldron, PA-C

## 2021-01-07 ENCOUNTER — Other Ambulatory Visit: Payer: Self-pay

## 2021-01-13 ENCOUNTER — Other Ambulatory Visit: Payer: Self-pay

## 2021-03-01 ENCOUNTER — Other Ambulatory Visit: Payer: Self-pay

## 2021-03-01 MED FILL — Butalbital-Acetaminophen-Caffeine Cap 50-300-40 MG: ORAL | 10 days supply | Qty: 30 | Fill #0 | Status: CN

## 2021-03-02 ENCOUNTER — Other Ambulatory Visit: Payer: Self-pay

## 2021-03-09 ENCOUNTER — Other Ambulatory Visit: Payer: Self-pay

## 2021-03-15 ENCOUNTER — Ambulatory Visit: Payer: Self-pay

## 2021-03-15 NOTE — Telephone Encounter (Signed)
Patient called and she says that the headaches she has have been off and on since December. She says they are shooting pains in her head that lasts a minute that can happen several times throughout the day. She has not headache at this time, but says the intensity is a 5-6 when it happens. She denies any other symptoms. She says this could be stress because she has stress in her life at this time, but she also says she wants to be sure. She says she will start checking her BP this evening. Patient advised of scheduled appointment on 04/06/21 at 1450, care advice given, patient verbalized understanding.   Message from Nani Ravens sent at 03/15/2021  3:44 PM EDT  Pt called in to push up her apt with PCP. Assisted pt with changing apt to a sooner date. Pt says that she has been experiencing headaches since she had her stroke in December. Pt has no complaints of other symptoms such a nausea, dizziness, light headiness. Pt says that she will start writing down her bp readings but she feels that her headaches could be due to stress. Not sure if meet triage criteria. Also sent a message to the office to see if pt could be worked in to be seen sooner than scheduled.    Reason for Disposition  Headache is a chronic symptom (recurrent or ongoing AND present > 4 weeks)  Answer Assessment - Initial Assessment Questions 1. LOCATION: "Where does it hurt?"      On the right side by the temple or back of head 2. ONSET: "When did the headache start?" (Minutes, hours or days)      Since December off and on 3. PATTERN: "Does the pain come and go, or has it been constant since it started?"     Come and go 4. SEVERITY: "How bad is the pain?" and "What does it keep you from doing?"  (e.g., Scale 1-10; mild, moderate, or severe)   - MILD (1-3): doesn't interfere with normal activities    - MODERATE (4-7): interferes with normal activities or awakens from sleep    - SEVERE (8-10): excruciating pain, unable to do any  normal activities        No pain right now; sometimes 5-6 shooting pain lasting up to a minute 5. RECURRENT SYMPTOM: "Have you ever had headaches before?" If Yes, ask: "When was the last time?" and "What happened that time?"      Yes 6. CAUSE: "What do you think is causing the headache?"     Maybe stress 7. MIGRAINE: "Have you been diagnosed with migraine headaches?" If Yes, ask: "Is this headache similar?"      No 8. HEAD INJURY: "Has there been any recent injury to the head?"      No 9. OTHER SYMPTOMS: "Do you have any other symptoms?" (fever, stiff neck, eye pain, sore throat, cold symptoms)     No 10. PREGNANCY: "Is there any chance you are pregnant?" "When was your last menstrual period?"       No  Protocols used: Headache-A-AH

## 2021-04-06 ENCOUNTER — Ambulatory Visit: Payer: Medicaid Other | Admitting: Nurse Practitioner

## 2021-04-12 ENCOUNTER — Ambulatory Visit: Payer: Medicaid Other | Admitting: Internal Medicine

## 2021-04-14 ENCOUNTER — Ambulatory Visit: Payer: Self-pay

## 2021-04-14 NOTE — Telephone Encounter (Signed)
Pt. Reports she started having left hip pain and bilateral shoulder pain "since my stroke this year." Using a cane to walk around. Tylenol helps "a little." Appointment made by [facility] agent for next week.    Answer Assessment - Initial Assessment Questions 1. LOCATION and RADIATION: "Where is the pain located?"      Left hip 2. QUALITY: "What does the pain feel like?"  (e.g., sharp, dull, aching, burning)     Sharp 3. SEVERITY: "How bad is the pain?" "What does it keep you from doing?"   (Scale 1-10; or mild, moderate, severe)   -  MILD (1-3): doesn't interfere with normal activities    -  MODERATE (4-7): interferes with normal activities (e.g., work or school) or awakens from sleep, limping    -  SEVERE (8-10): excruciating pain, unable to do any normal activities, unable to walk     Moderate 4. ONSET: "When did the pain start?" "Does it come and go, or is it there all the time?"     January this year 5. WORK OR EXERCISE: "Has there been any recent work or exercise that involved this part of the body?"      No 6. CAUSE: "What do you think is causing the hip pain?"      Unsure 7. AGGRAVATING FACTORS: "What makes the hip pain worse?" (e.g., walking, climbing stairs, running)     Walking - uses cane 8. OTHER SYMPTOMS: "Do you have any other symptoms?" (e.g., back pain, pain shooting down leg,  fever, rash)     Shoulder pain and back pain.  Protocols used: Hip Pain-A-AH

## 2021-04-20 ENCOUNTER — Other Ambulatory Visit: Payer: Self-pay

## 2021-04-20 ENCOUNTER — Encounter: Payer: Self-pay | Admitting: Physician Assistant

## 2021-04-20 ENCOUNTER — Ambulatory Visit: Payer: Self-pay | Attending: Physician Assistant | Admitting: Physician Assistant

## 2021-04-20 VITALS — BP 155/104 | HR 94 | Temp 98.7°F | Resp 18 | Ht 62.0 in | Wt 267.0 lb

## 2021-04-20 DIAGNOSIS — M25552 Pain in left hip: Secondary | ICD-10-CM

## 2021-04-20 DIAGNOSIS — M62838 Other muscle spasm: Secondary | ICD-10-CM

## 2021-04-20 DIAGNOSIS — F17200 Nicotine dependence, unspecified, uncomplicated: Secondary | ICD-10-CM

## 2021-04-20 DIAGNOSIS — R0602 Shortness of breath: Secondary | ICD-10-CM

## 2021-04-20 DIAGNOSIS — G629 Polyneuropathy, unspecified: Secondary | ICD-10-CM

## 2021-04-20 DIAGNOSIS — Z6841 Body Mass Index (BMI) 40.0 and over, adult: Secondary | ICD-10-CM

## 2021-04-20 DIAGNOSIS — I609 Nontraumatic subarachnoid hemorrhage, unspecified: Secondary | ICD-10-CM

## 2021-04-20 DIAGNOSIS — I10 Essential (primary) hypertension: Secondary | ICD-10-CM

## 2021-04-20 MED ORDER — ALBUTEROL SULFATE HFA 108 (90 BASE) MCG/ACT IN AERS
2.0000 | INHALATION_SPRAY | Freq: Four times a day (QID) | RESPIRATORY_TRACT | 0 refills | Status: DC | PRN
  Filled 2021-04-20: qty 8.5, 30d supply, fill #0

## 2021-04-20 MED ORDER — CETIRIZINE HCL 10 MG PO TABS
10.0000 mg | ORAL_TABLET | Freq: Every day | ORAL | 11 refills | Status: DC
  Filled 2021-04-20 – 2022-01-30 (×3): qty 30, 30d supply, fill #0
  Filled 2022-02-26 – 2022-04-13 (×3): qty 30, 30d supply, fill #1

## 2021-04-20 MED ORDER — CYCLOBENZAPRINE HCL 10 MG PO TABS
10.0000 mg | ORAL_TABLET | Freq: Three times a day (TID) | ORAL | 0 refills | Status: DC | PRN
  Filled 2021-04-20: qty 20, 7d supply, fill #0

## 2021-04-20 MED ORDER — GABAPENTIN 300 MG PO CAPS
300.0000 mg | ORAL_CAPSULE | Freq: Every day | ORAL | 1 refills | Status: DC
  Filled 2021-04-20: qty 30, 30d supply, fill #0

## 2021-04-20 NOTE — Patient Instructions (Signed)
To help with your pain, you will start taking gabapentin at bedtime.  I encourage you to also use the Flexeril sparingly as needed.  To help with your shortness of breath, I encourage you to start taking Zyrtec on a daily basis work on increasing your daily movement, work on reducing the amount of cigarettes you are smoking on a daily basis.  Please plan on having fasting labs completed at your next office visit.  Kennieth Rad, PA-C Physician Assistant [facility] [email or web address]   Neuropathic Pain Neuropathic pain is pain caused by damage to the nerves that are responsible for certain sensations in your body (sensory nerves). The pain can be caused by: Damage to the sensory nerves that send signals to your spinal cord and brain (peripheral nervous system). Damage to the sensory nerves in your brain or spinal cord (central nervous system). Neuropathic pain can make you more sensitive to pain. Even a minor sensation can feel very painful. This is usually a long-term condition that can be difficult to treat. The type of pain differs from person to person. It may: Start suddenly (acute), or it may develop slowly and last for a long time (chronic). Come and go as damaged nerves heal, or it may stay at the same level for years. Cause emotional distress, loss of sleep, and a lower quality of life. What are the causes? The most common cause of this condition is diabetes. Many other diseases and conditions can also cause neuropathic pain. Causes of neuropathic pain can be classified as: Toxic. This is caused by medicines and chemicals. The most common cause of toxic neuropathic pain is damage from cancer treatments (chemotherapy). Metabolic. This can be caused by: Diabetes. This is the most common disease that damages the nerves. Lack of vitamin B from long-term alcohol abuse. Traumatic. Any injury that cuts, crushes, or stretches a nerve  can cause damage and pain. A common example is feeling pain after losing an arm or leg (phantom limb pain). Compression-related. If a sensory nerve gets trapped or compressed for a long period of time, the blood supply to the nerve can be cut off. Vascular. Many blood vessel diseases can cause neuropathic pain by decreasing blood supply and oxygen to nerves. Autoimmune. This type of pain results from diseases in which the body's defense system (immune system) mistakenly attacks sensory nerves. Examples of autoimmune diseases that can cause neuropathic pain include lupus and multiple sclerosis. Infectious. Many types of viral infections can damage sensory nerves and cause pain. Shingles infection is a common cause of this type of pain. Inherited. Neuropathic pain can be a symptom of many diseases that are passed down through families (genetic). What increases the risk? You are more likely to develop this condition if: You have diabetes. You smoke. You drink too much alcohol. You are taking certain medicines, including medicines that kill cancer cells (chemotherapy) or that treat immune system disorders. What are the signs or symptoms? The main symptom is pain. Neuropathic pain is often described as: Burning. Shock-like. Stinging. Hot or cold. Itching. How is this diagnosed? No single test can diagnose neuropathic pain. It is diagnosed based on: Physical exam and your symptoms. Your health care provider will ask you about your pain. You may be asked to use a pain scale to describe how bad your pain is. Tests. These may be done to see if you have a high sensitivity to pain and to help find the cause and location of any sensory nerve damage.  They include: Nerve conduction studies to test how well nerve signals travel through your sensory nerves (electrodiagnostic testing). Stimulating your sensory nerves through electrodes on your skin and measuring the response in your spinal cord and brain  (somatosensory evoked potential). Imaging studies, such as: X-rays. CT scan. MRI. How is this treated? Treatment for neuropathic pain may change over time. You may need to try different treatment options or a combination of treatments. Some options include: Treating the underlying cause of the neuropathy, such as diabetes, kidney disease, or vitamin deficiencies. Stopping medicines that can cause neuropathy, such as chemotherapy. Medicine to relieve pain. Medicines may include: Prescription or over-the-counter pain medicine. Anti-seizure medicine. Antidepressant medicines. Pain-relieving patches that are applied to painful areas of skin. A medicine to numb the area (local anesthetic), which can be injected as a nerve block. Transcutaneous nerve stimulation. This uses electrical currents to block painful nerve signals. The treatment is painless. Alternative treatments, such as: Acupuncture. Meditation. Massage. Physical therapy. Pain management programs. Counseling. Follow these instructions at home: Medicines  Take over-the-counter and prescription medicines only as told by your health care provider. Do not drive or use heavy machinery while taking prescription pain medicine. If you are taking prescription pain medicine, take actions to prevent or treat constipation. Your health care provider may recommend that you: Drink enough fluid to keep your urine pale yellow. Eat foods that are high in fiber, such as fresh fruits and vegetables, whole grains, and beans. Limit foods that are high in fat and processed sugars, such as fried or sweet foods. Take an over-the-counter or prescription medicine for constipation. Lifestyle  Have a good support system at home. Consider joining a chronic pain support group. Do not use any products that contain nicotine or tobacco, such as cigarettes and e-cigarettes. If you need help quitting, ask your health care provider. Do not drink  alcohol. General instructions Learn as much as you can about your condition. Work closely with all your health care providers to find the treatment plan that works best for you. Ask your health care provider what activities are safe for you. Keep all follow-up visits as told by your health care provider. This is important. Contact a health care provider if: Your pain treatments are not working. You are having side effects from your medicines. You are struggling with tiredness (fatigue), mood changes, depression, or anxiety. Summary Neuropathic pain is pain caused by damage to the nerves that are responsible for certain sensations in your body (sensory nerves). Neuropathic pain may come and go as damaged nerves heal, or it may stay at the same level for years. Neuropathic pain is usually a long-term condition that can be difficult to treat. Consider joining a chronic pain support group. This information is not intended to replace advice given to you by your health care provider. Make sure you discuss any questions you have with your health care provider. Document Revised: 10/17/2018 Document Reviewed: 07/13/2017 Elsevier Patient Education  2022 [employer].

## 2021-04-20 NOTE — Progress Notes (Signed)
Patient reports hip and shoulder pain described as aching and sharp. Patient has eaten today. Patient has not taken medication. Patient has been out of medication for 3 days. Patient reports having no transportation so was unable to take July and august Restarted in September and has been out for 3 days.

## 2021-04-20 NOTE — Progress Notes (Signed)
Established Patient Office Visit  Subjective:  Patient ID: Lisa Haley, female    DOB: 03/06/1976  Age: 45 y.o. MRN: [id number]  CC:  Chief Complaint  Patient presents with   Pain    HPI Lisa Haley reports that she continues to have pain following her stroke in January 2022.  Reports her pain in her entire back, both shoulders and left hip and left leg.  Describes the pain as aching, with sharp pains, sometimes feels as "electric shock in her back that radiates to her shoulders".  Reports the pain is making it difficult for her to sleep.  States that her pain in her left leg has been present prior to her stroke.  Denies injury or trauma to her leg or knee, states that she has previously been prescribed muscle relaxers with some relief.  Reports that she has been having some shortness of breath, states that it happens a couple of times a week, with exertion.  States that she is still smoking cigarettes but has cut back to just a few days.    Past Medical History:  Diagnosis Date   Hypertension    Obesity    Stroke [facility])     Past Surgical History:  Procedure Laterality Date   ABDOMINAL HYSTERECTOMY     IR ANGIO INTRA EXTRACRAN SEL INTERNAL CAROTID BILAT MOD SED  07/06/2020   IR ANGIO INTRA EXTRACRAN SEL INTERNAL CAROTID BILAT MOD SED  07/12/2020   IR ANGIO VERTEBRAL SEL VERTEBRAL BILAT MOD SED  07/06/2020   IR ANGIO VERTEBRAL SEL VERTEBRAL BILAT MOD SED  07/12/2020   RADIOLOGY WITH ANESTHESIA N/A 07/06/2020   Procedure: IR WITH ANESTHESIA;  Surgeon: Consuella Lose, MD;  Location: [facility];  Service: Radiology;  Laterality: N/A;   TUBAL LIGATION      History reviewed. No pertinent family history.  Social History   Socioeconomic History   Marital status: Single    Spouse name: Not on file   Number of children: Not on file   Years of education: Not on file   Highest education level: Not on file  Occupational History   Not on file  Tobacco Use   Smoking  status: Every Day    Packs/day: 0.25    Types: Cigarettes   Smokeless tobacco: Never  Substance and Sexual Activity   Alcohol use: Yes    Comment: once a week   Drug use: No   Sexual activity: Yes    Birth control/protection: Surgical  Other Topics Concern   Not on file  Social History Narrative   Not on file   Social Determinants of Health   Financial Resource Strain: Not on file  Food Insecurity: Not on file  Transportation Needs: Not on file  Physical Activity: Not on file  Stress: Not on file  Social Connections: Not on file  Intimate Partner Violence: Not on file    Outpatient Medications Prior to Visit  Medication Sig Dispense Refill   acetaminophen (TYLENOL) 500 MG tablet Take 1,000 mg by mouth every 6 (six) hours as needed for moderate pain or headache.     amLODipine (NORVASC) 5 MG tablet TAKE 1 TABLET (5 MG TOTAL) BY MOUTH DAILY. 30 tablet 3   levETIRAcetam (KEPPRA) 500 MG tablet TAKE 1 TABLET (500 MG TOTAL) BY MOUTH 2 (TWO) TIMES DAILY. 60 tablet 3   meloxicam (MOBIC) 7.5 MG tablet Take 1 tablet (7.5 mg total) by mouth daily. Prn pain 30 tablet 0   Butalbital-APAP-Caffeine 50-300-40 MG CAPS  TAKE 1 - 2 CAPSULES BY MOUTH EVERY 6 HOURS AS NEEDED (NOT TO EXCEED 6 CAPSULES PER 24HRS) (Patient not taking: Reported on 04/20/2021) 30 capsule 0   ibuprofen (ADVIL) 200 MG tablet Take 400 mg by mouth every 6 (six) hours as needed for headache or moderate pain. (Patient not taking: Reported on 04/20/2021)     No facility-administered medications prior to visit.    No Known Allergies  ROS Review of Systems  Constitutional:  Negative for chills and fever.  HENT:  Negative for congestion and sore throat.   Eyes: Negative.   Respiratory:  Positive for shortness of breath. Negative for cough and wheezing.   Cardiovascular:  Negative for chest pain.  Gastrointestinal: Negative.   Endocrine: Negative.   Genitourinary: Negative.   Musculoskeletal:  Positive for arthralgias  and myalgias.  Skin: Negative.   Allergic/Immunologic: Negative.   Neurological:  Negative for dizziness, speech difficulty and headaches.  Hematological: Negative.   Psychiatric/Behavioral: Negative.       Objective:    Physical Exam Vitals and nursing note reviewed.  Constitutional:      Appearance: Normal appearance. She is obese.  HENT:     Head: Normocephalic and atraumatic.     Right Ear: External ear normal.     Left Ear: External ear normal.     Nose: Nose normal.     Mouth/Throat:     Mouth: Mucous membranes are moist.     Pharynx: Oropharynx is clear.  Eyes:     Extraocular Movements: Extraocular movements intact.     Conjunctiva/sclera: Conjunctivae normal.     Pupils: Pupils are equal, round, and reactive to light.  Cardiovascular:     Rate and Rhythm: Normal rate and regular rhythm.     Pulses:          Dorsalis pedis pulses are 2+ on the right side and 2+ on the left side.       Posterior tibial pulses are 2+ on the right side and 2+ on the left side.     Heart sounds: Normal heart sounds.  Pulmonary:     Effort: Pulmonary effort is normal.     Breath sounds: Normal breath sounds. No wheezing.  Musculoskeletal:        General: Normal range of motion.     Cervical back: Normal range of motion and neck supple.     Right lower leg: No edema.     Left lower leg: No edema.  Skin:    General: Skin is warm.  Neurological:     General: No focal deficit present.     Mental Status: She is alert and oriented to person, place, and time.  Psychiatric:        Mood and Affect: Mood normal.        Behavior: Behavior normal.        Thought Content: Thought content normal.        Judgment: Judgment normal.    BP (!) 155/104 (BP Location: Right Arm, Patient Position: Sitting, Cuff Size: Large)   Pulse 94   Temp 98.7 F (37.1 C) (Oral)   Resp 18   Ht _0  (1.575 m)   Wt 267 lb (121.1 kg)   SpO2 98%   BMI 48.83 kg/m  Wt Readings from Last 3 Encounters:   04/20/21 267 lb (121.1 kg)  08/06/20 267 lb 12.8 oz (121.5 kg)  07/06/20 274 lb 7.6 oz (124.5 kg)     Health Maintenance Due  Topic Date Due   COVID-19 Vaccine (1) Never done   Hepatitis C Screening  Never done   PAP SMEAR-Modifier  Never done   INFLUENZA VACCINE  02/07/2021    There are no preventive care reminders to display for this patient.  No results found for: TSH Lab Results  Component Value Date   WBC 7.9 12/07/2020   HGB 13.9 12/07/2020   HCT 42.4 12/07/2020   MCV 98.4 12/07/2020   PLT 298 12/07/2020   Lab Results  Component Value Date   NA 140 04/20/2021   K 4.5 04/20/2021   CO2 24 12/07/2020   GLUCOSE 88 04/20/2021   BUN 11 04/20/2021   CREATININE 1.00 04/20/2021   BILITOT 0.4 04/20/2021   ALKPHOS 80 04/20/2021   AST 16 04/20/2021   ALT 15 12/07/2020   PROT 6.8 04/20/2021   ALBUMIN 4.2 04/20/2021   CALCIUM 9.7 04/20/2021   ANIONGAP 3 (L) 12/07/2020   EGFR 71 04/20/2021   No results found for: CHOL No results found for: HDL No results found for: [facility] Lab Results  Component Value Date   TRIG 80 07/11/2020   No results found for: [facility] Lab Results  Component Value Date   HGBA1C 5.3 07/07/2020      Assessment & Plan:   Problem List Items Addressed This Visit       Cardiovascular and Mediastinum   Essential hypertension   Relevant Orders   Comp. Metabolic Panel (12) (Completed)     Nervous and Auditory   SAH (subarachnoid hemorrhage) (HCC)     Other   Tobacco dependence   Other Visit Diagnoses     Neuropathy    -  Primary   Relevant Medications   gabapentin (NEURONTIN) 300 MG capsule   Muscle spasm of left lower extremity       Relevant Medications   cyclobenzaprine (FLEXERIL) 10 MG tablet   Left hip pain       SOB (shortness of breath)       Relevant Medications   gabapentin (NEURONTIN) 300 MG capsule   cetirizine (ZYRTEC) 10 MG tablet   albuterol (VENTOLIN HFA) 108 (90 Base) MCG/ACT inhaler   Class 3 severe obesity  due to excess calories with serious comorbidity and body mass index (BMI) of 45.0 to 49.9 in adult [facility])           Meds ordered this encounter  Medications   gabapentin (NEURONTIN) 300 MG capsule    Sig: Take 1 capsule (300 mg total) by mouth at bedtime.    Dispense:  30 capsule    Refill:  1    Order Specific Question:   Supervising Provider    Answer:   Asencion Noble E [[id number]]   cyclobenzaprine (FLEXERIL) 10 MG tablet    Sig: Take 1 tablet (10 mg total) by mouth 3 (three) times daily as needed for muscle spasms.    Dispense:  20 tablet    Refill:  0    Order Specific Question:   Supervising Provider    Answer:   Asencion Noble E [[id number]]   cetirizine (ZYRTEC) 10 MG tablet    Sig: Take 1 tablet (10 mg total) by mouth daily.    Dispense:  30 tablet    Refill:  11    Order Specific Question:   Supervising Provider    Answer:   Asencion Noble E [[id number]]   albuterol (VENTOLIN HFA) 108 (90 Base) MCG/ACT inhaler    Sig: Inhale 2 puffs into the  lungs every 6 (six) hours as needed for wheezing or shortness of breath.    Dispense:  8.5 g    Refill:  0    Order Specific Question:   Supervising Provider    Answer:   WRIGHT, PATRICK E [[id number]]   1. Neuropathy Trial gabapentin.  Patient education given on supportive care.  Patient stated that she should have adult Medicaid soon, will restart referral to neurology when this is available.  Patient to follow-up with this provider in 2 weeks.  Patient encouraged to schedule morning appointment to have fasting labs completed.  Patient understands and agrees.  Red flags given for prompt reevaluation - gabapentin (NEURONTIN) 300 MG capsule; Take 1 capsule (300 mg total) by mouth at bedtime.  Dispense: 30 capsule; Refill: 1  2. SAH (subarachnoid hemorrhage) ([place])   3. Muscle spasm of left lower extremity Trial Flexeril.  Patient education given on supportive care - cyclobenzaprine (FLEXERIL) 10 MG tablet; Take 1 tablet (10 mg total) by mouth 3  (three) times daily as needed for muscle spasms.  Dispense: 20 tablet; Refill: 0  4. Left hip pain   5. Essential hypertension Patient encouraged to check blood pressure on a daily basis, keep a written log and have available for all office visits. - Comp. Metabolic Panel (12)  6. Tobacco dependence Smoking cessation counseling given  7. SOB (shortness of breath) Trial Zyrtec, trial albuterol inhaler.  Patient education given on supportive care, encouraged to monitor use of albuterol inhaler - cetirizine (ZYRTEC) 10 MG tablet; Take 1 tablet (10 mg total) by mouth daily.  Dispense: 30 tablet; Refill: 11 - albuterol (VENTOLIN HFA) 108 (90 Base) MCG/ACT inhaler; Inhale 2 puffs into the lungs every 6 (six) hours as needed for wheezing or shortness of breath.  Dispense: 8.5 g; Refill: 0   I have reviewed the patient's medical history (PMH, PSH, Social History, Family History, Medications, and allergies) , and have been updated if relevant. I spent 30 minutes reviewing chart and  face to face time with patient.    Follow-up: Return in about 2 weeks (around 05/04/2021).    Loraine Grip Mayers, PA-C

## 2021-04-21 ENCOUNTER — Other Ambulatory Visit: Payer: Self-pay | Admitting: Internal Medicine

## 2021-04-21 DIAGNOSIS — M25552 Pain in left hip: Secondary | ICD-10-CM | POA: Insufficient documentation

## 2021-04-21 DIAGNOSIS — I10 Essential (primary) hypertension: Secondary | ICD-10-CM

## 2021-04-21 DIAGNOSIS — R0602 Shortness of breath: Secondary | ICD-10-CM | POA: Insufficient documentation

## 2021-04-21 DIAGNOSIS — I609 Nontraumatic subarachnoid hemorrhage, unspecified: Secondary | ICD-10-CM

## 2021-04-21 DIAGNOSIS — G629 Polyneuropathy, unspecified: Secondary | ICD-10-CM | POA: Insufficient documentation

## 2021-04-21 LAB — COMP. METABOLIC PANEL (12)
AST: 16 IU/L (ref 0–40)
Albumin/Globulin Ratio: 1.6 (ref 1.2–2.2)
Albumin: 4.2 g/dL (ref 3.8–4.8)
Alkaline Phosphatase: 80 IU/L (ref 44–121)
BUN/Creatinine Ratio: 11 (ref 9–23)
BUN: 11 mg/dL (ref 6–24)
Bilirubin Total: 0.4 mg/dL (ref 0.0–1.2)
Calcium: 9.7 mg/dL (ref 8.7–10.2)
Chloride: 104 mmol/L (ref 96–106)
Creatinine, Ser: 1 mg/dL (ref 0.57–1.00)
Globulin, Total: 2.6 g/dL (ref 1.5–4.5)
Glucose: 88 mg/dL (ref 70–99)
Potassium: 4.5 mmol/L (ref 3.5–5.2)
Sodium: 140 mmol/L (ref 134–144)
Total Protein: 6.8 g/dL (ref 6.0–8.5)
eGFR: 71 mL/min/{1.73_m2} (ref >59)

## 2021-04-21 NOTE — Telephone Encounter (Signed)
Copied from [place] [id number]. Topic: Quick Communication - Rx Refill/Question >> Apr 21, 2021  2:58 PM Leward Quan A wrote: Medication: amLODipine (NORVASC) 5 MG tablet per patient she have not had this in 5 days  levETIRAcetam (KEPPRA) 500 MG tablet, meloxicam (MOBIC) 7.5 MG tablet  Has the patient contacted their pharmacy? Yes.  Went to pharmacy no more refills  (Agent: If no, request that the patient contact the pharmacy for the refill.) (Agent: If yes, when and what did the pharmacy advise?)  Preferred Pharmacy (with phone number or street name): [facility] and [address]  Phone:  [phone] Fax:  [phone]    Has the patient been seen for an appointment in the last year OR does the patient have an upcoming appointment? Yes.    Agent: Please be advised that RX refills may take up to 3 business days. We ask that you follow-up with your pharmacy.

## 2021-04-22 ENCOUNTER — Other Ambulatory Visit: Payer: Self-pay

## 2021-04-22 MED ORDER — MELOXICAM 7.5 MG PO TABS
7.5000 mg | ORAL_TABLET | Freq: Every day | ORAL | 0 refills | Status: DC
  Filled 2021-04-22: qty 30, 30d supply, fill #0

## 2021-04-22 MED ORDER — LEVETIRACETAM 500 MG PO TABS
ORAL_TABLET | Freq: Two times a day (BID) | ORAL | 0 refills | Status: DC
  Filled 2021-04-22: qty 60, 30d supply, fill #0

## 2021-04-22 MED ORDER — AMLODIPINE BESYLATE 5 MG PO TABS
ORAL_TABLET | Freq: Every day | ORAL | 3 refills | Status: DC
Start: 2021-04-22 — End: 2021-11-09
  Filled 2021-04-22: qty 30, 30d supply, fill #0

## 2021-04-22 NOTE — Telephone Encounter (Signed)
Requested Prescriptions  Pending Prescriptions Disp Refills  . amLODipine (NORVASC) 5 MG tablet 30 tablet 3    Sig: TAKE 1 TABLET (5 MG TOTAL) BY MOUTH DAILY.     Cardiovascular:  Calcium Channel Blockers Failed - 04/21/2021  4:24 PM      Failed - Last BP in normal range    BP Readings from Last 1 Encounters:  04/20/21 (!) 155/104         Passed - Valid encounter within last 6 months    Recent Outpatient Visits          2 days ago Neuropathy   [place], [place]   3 months ago Left hip pain   [place], [place]   8 months ago Need for influenza vaccination   [place], RPH-CPP   8 months ago Hospital discharge follow-up   [place], [place]      Future Appointments            In 3 days Mayers, Janene Harvey [place]           . levETIRAcetam (KEPPRA) 500 MG tablet 60 tablet 3    Sig: TAKE 1 TABLET (500 MG TOTAL) BY MOUTH 2 (TWO) TIMES DAILY.     Not Delegated - Neurology:  Anticonvulsants Failed - 04/21/2021  4:24 PM      Failed - This refill cannot be delegated      Passed - HCT in normal range and within 360 days    HCT  Date Value Ref Range Status  12/07/2020 42.4 36.0 - 46.0 % Final         Passed - HGB in normal range and within 360 days    Hemoglobin  Date Value Ref Range Status  12/07/2020 13.9 12.0 - 15.0 g/dL Final         Passed - PLT in normal range and within 360 days    Platelets  Date Value Ref Range Status  12/07/2020 298 150 - 400 K/uL Final         Passed - WBC in normal range and within 360 days    WBC  Date Value Ref Range Status  12/07/2020 7.9 4.0 - 10.5 K/uL Final         Passed - Valid encounter within last 12 months    Recent Outpatient Visits          2 days ago Neuropathy   [place], PA-C   3 months ago Left hip pain   [place], [place]   8 months ago Need for influenza vaccination   [place], RPH-CPP   8 months ago Hospital discharge follow-up   [place], [place]      Future Appointments            In 3 days Mayers, Loraine Grip, PA-C [place]           . meloxicam (MOBIC) 7.5 MG tablet 30 tablet 0    Sig: Take 1 tablet (7.5 mg total) by mouth daily. Prn pain     Analgesics:  COX2 Inhibitors Failed - 04/21/2021  4:24 PM      Failed - Cr in normal range and within 360 days    Creatinine, Ser  Date Value Ref Range Status  04/20/2021 1.00 0.57 - 1.00 mg/dL Final         Passed - HGB in normal range and within 360 days    Hemoglobin  Date Value Ref Range Status  12/07/2020 13.9 12.0 - 15.0 g/dL Final         Passed - Patient is not pregnant      Passed - Valid encounter within last 12 months    Recent Outpatient Visits          2 days ago Neuropathy   [place], PA-C   3 months ago Left hip pain   [place], [place], [place]   8 months ago Need for influenza vaccination   [place], RPH-CPP   8 months ago Hospital discharge follow-up   [place], MD      Future Appointments            In 3 days Mayers, Loraine Grip, PA-C [place]

## 2021-04-22 NOTE — Telephone Encounter (Signed)
Notes to clinic This medication can not be delegated, please assess.   Requested Prescriptions  Pending Prescriptions Disp Refills   levETIRAcetam (KEPPRA) 500 MG tablet 60 tablet 3    Sig: TAKE 1 TABLET (500 MG TOTAL) BY MOUTH 2 (TWO) TIMES DAILY.     Not Delegated - Neurology:  Anticonvulsants Failed - 04/21/2021  4:24 PM      Failed - This refill cannot be delegated      Passed - HCT in normal range and within 360 days    HCT  Date Value Ref Range Status  12/07/2020 42.4 36.0 - 46.0 % Final          Passed - HGB in normal range and within 360 days    Hemoglobin  Date Value Ref Range Status  12/07/2020 13.9 12.0 - 15.0 g/dL Final          Passed - PLT in normal range and within 360 days    Platelets  Date Value Ref Range Status  12/07/2020 298 150 - 400 K/uL Final          Passed - WBC in normal range and within 360 days    WBC  Date Value Ref Range Status  12/07/2020 7.9 4.0 - 10.5 K/uL Final          Passed - Valid encounter within last 12 months    Recent Outpatient Visits           2 days ago Neuropathy   [place], PA-C   3 months ago Left hip pain   [place], [place], [place]   8 months ago Need for influenza vaccination   [place], Stephen L, RPH-CPP   8 months ago Hospital discharge follow-up   [place], MD       Future Appointments             In 3 days Mayers, Loraine Grip, PA-C [place]            Signed Prescriptions Disp Refills   amLODipine (NORVASC) 5 MG tablet 30 tablet 3    Sig: TAKE 1 TABLET (5 MG TOTAL) BY MOUTH DAILY.     Cardiovascular:  Calcium Channel Blockers Failed - 04/21/2021  4:24 PM      Failed - Last BP in normal range    BP Readings from Last 1 Encounters:  04/20/21 (!) 155/104          Passed -  Valid encounter within last 6 months    Recent Outpatient Visits           2 days ago Neuropathy   [place], [place]   3 months ago Left hip pain   [place], [place]   8 months ago Need for influenza vaccination   [place], RPH-CPP   8 months ago Hospital discharge follow-up   [place], MD       Future Appointments             In 3 days Mayers, Loraine Grip, PA-C [place]             meloxicam [facility])  7.5 MG tablet 30 tablet 0    Sig: Take 1 tablet (7.5 mg total) by mouth daily. Prn pain     Analgesics:  COX2 Inhibitors Failed - 04/21/2021  4:24 PM      Failed - Cr in normal range and within 360 days    Creatinine, Ser  Date Value Ref Range Status  04/20/2021 1.00 0.57 - 1.00 mg/dL Final          Passed - HGB in normal range and within 360 days    Hemoglobin  Date Value Ref Range Status  12/07/2020 13.9 12.0 - 15.0 g/dL Final          Passed - Patient is not pregnant      Passed - Valid encounter within last 12 months    Recent Outpatient Visits           2 days ago Neuropathy   [place], PA-C   3 months ago Left hip pain   [place], [place]   8 months ago Need for influenza vaccination   [place], RPH-CPP   8 months ago Hospital discharge follow-up   [place], [place]       Future Appointments             In 3 days Mayers, Loraine Grip, PA-C [place]

## 2021-04-25 ENCOUNTER — Ambulatory Visit: Payer: Medicaid Other | Admitting: Physician Assistant

## 2021-04-27 ENCOUNTER — Other Ambulatory Visit: Payer: Self-pay

## 2021-04-29 ENCOUNTER — Other Ambulatory Visit: Payer: Self-pay

## 2021-04-29 ENCOUNTER — Telehealth: Payer: Self-pay | Admitting: *Deleted

## 2021-04-29 NOTE — Telephone Encounter (Signed)
-----   Message from Kennieth Rad, [place] sent at 04/21/2021  7:20 PM EDT ----- Please call patient and let her know that her kidney function and liver function are within normal limits.

## 2021-04-29 NOTE — Telephone Encounter (Signed)
Medical Assistant left message on patient's home and cell voicemail. Voicemail states to give a call back to Burney Calzadilla with [facility] at [phone].  

## 2021-05-02 ENCOUNTER — Ambulatory Visit: Payer: Medicaid Other | Admitting: Physician Assistant

## 2021-11-09 ENCOUNTER — Encounter: Payer: Self-pay | Admitting: Physician Assistant

## 2021-11-09 ENCOUNTER — Other Ambulatory Visit: Payer: Self-pay

## 2021-11-09 ENCOUNTER — Ambulatory Visit: Payer: Managed Care, Other (non HMO) | Attending: Physician Assistant | Admitting: Physician Assistant

## 2021-11-09 VITALS — BP 148/102 | HR 85 | Wt 259.8 lb

## 2021-11-09 DIAGNOSIS — K5900 Constipation, unspecified: Secondary | ICD-10-CM

## 2021-11-09 DIAGNOSIS — M62838 Other muscle spasm: Secondary | ICD-10-CM

## 2021-11-09 DIAGNOSIS — R1030 Lower abdominal pain, unspecified: Secondary | ICD-10-CM

## 2021-11-09 DIAGNOSIS — R0602 Shortness of breath: Secondary | ICD-10-CM | POA: Diagnosis not present

## 2021-11-09 DIAGNOSIS — G629 Polyneuropathy, unspecified: Secondary | ICD-10-CM | POA: Diagnosis not present

## 2021-11-09 DIAGNOSIS — Z91199 Patient's noncompliance with other medical treatment and regimen due to unspecified reason: Secondary | ICD-10-CM

## 2021-11-09 DIAGNOSIS — I10 Essential (primary) hypertension: Secondary | ICD-10-CM

## 2021-11-09 DIAGNOSIS — Z803 Family history of malignant neoplasm of breast: Secondary | ICD-10-CM

## 2021-11-09 LAB — POCT URINALYSIS DIP (CLINITEK)
Blood, UA: NEGATIVE
Glucose, UA: NEGATIVE mg/dL
Ketones, POC UA: NEGATIVE mg/dL
Leukocytes, UA: NEGATIVE
Nitrite, UA: NEGATIVE
POC PROTEIN,UA: 30 — AB
Spec Grav, UA: >=1.03 — AB (ref 1.010–1.025)
Urobilinogen, UA: 1 E.U./dL
pH, UA: 5.5 (ref 5.0–8.0)

## 2021-11-09 MED ORDER — ALBUTEROL SULFATE HFA 108 (90 BASE) MCG/ACT IN AERS
2.0000 | INHALATION_SPRAY | Freq: Four times a day (QID) | RESPIRATORY_TRACT | 0 refills | Status: DC | PRN
  Filled 2021-11-09: qty 8.5, 25d supply, fill #0

## 2021-11-09 MED ORDER — CYCLOBENZAPRINE HCL 10 MG PO TABS
5.0000 mg | ORAL_TABLET | Freq: Three times a day (TID) | ORAL | 0 refills | Status: DC | PRN
  Filled 2021-11-09: qty 40, 27d supply, fill #0

## 2021-11-09 MED ORDER — GABAPENTIN 300 MG PO CAPS
300.0000 mg | ORAL_CAPSULE | Freq: Every day | ORAL | 1 refills | Status: DC
  Filled 2021-11-09: qty 30, 30d supply, fill #0
  Filled 2021-12-29: qty 30, 30d supply, fill #1
  Filled 2022-01-03 – 2022-01-30 (×2): qty 30, 30d supply, fill #0

## 2021-11-09 MED ORDER — MELOXICAM 7.5 MG PO TABS
7.5000 mg | ORAL_TABLET | Freq: Every day | ORAL | 0 refills | Status: DC
  Filled 2021-11-09: qty 30, 30d supply, fill #0

## 2021-11-09 MED ORDER — AMLODIPINE BESYLATE 5 MG PO TABS
ORAL_TABLET | Freq: Every day | ORAL | 3 refills | Status: DC
  Filled 2021-11-09: qty 30, 30d supply, fill #0
  Filled 2021-12-29: qty 30, 30d supply, fill #1
  Filled 2022-01-03: qty 90, 90d supply, fill #0

## 2021-11-09 NOTE — Progress Notes (Signed)
Patient ID: Lisa Haley, female   DOB: [birth year], 46 y.o.   MRN: [id number] ? ? ?Lisa Haley, is a 46 y.o. female ? ?[id number] ? ?[id number] ? ?DOB - 1976-06-24 ? ?Chief Complaint  ?Patient presents with  ? Nausea  ? Abdominal Pain  ?    ? ?Subjective:  ? ?Lisa Haley is a 46 y.o. female here today for lower abdominal pain.  Appetite is good.  Some constipation.  No dysuria. She did not have insurance and has not been taking any medications.  She has had a partial hysterectomy.   Still has ovaries and h/o ovarian cysts.  "I want to be checked for everything bc cancer runs in my family." ?Lower abdominal pain on the left side.  Worse with standing.  Worse first thing in the morning.   ?No fever.  No vaginal discharge.   ? ?Onset:  months ago ?Location: lower abdomen and L leg ?Duration:  lasts all day/waxes and wanes in intensity ?Characterization/quality:  sharp at times.  pressure ?Alleviating/aggravating:  ibu helps some ?Radiation:  into L leg ?Severity:  moderate to severe at times.   ? ?C/o other various aches and pains ? ?No problems updated. ? ?ALLERGIES: ?No Known Allergies ? ?PAST MEDICAL HISTORY: ?Past Medical History:  ?Diagnosis Date  ? Hypertension   ? Obesity   ? Stroke [facility])   ? ? ?MEDICATIONS AT HOME: ?Prior to Admission medications   ?Medication Sig Start Date End Date Taking? Authorizing Provider  ?acetaminophen (TYLENOL) 500 MG tablet Take 1,000 mg by mouth every 6 (six) hours as needed for moderate pain or headache.    [provider]  ?albuterol (VENTOLIN HFA) 108 (90 Base) MCG/ACT inhaler Inhale 2 puffs into the lungs every 6 (six) hours as needed for wheezing or shortness of breath. 11/09/21   Argentina Donovan, PA-C  ?amLODipine (NORVASC) 5 MG tablet TAKE 1 TABLET (5 MG TOTAL) BY MOUTH DAILY. 11/09/21 11/09/22  Argentina Donovan, PA-C  ?cetirizine (ZYRTEC) 10 MG tablet Take 1 tablet (10 mg total) by mouth daily. 04/20/21   Mayers, Cari S, PA-C  ?cyclobenzaprine  (FLEXERIL) 10 MG tablet Take 0.5 tablets (5 mg total) by mouth 3 (three) times daily as needed for muscle spasms. 11/09/21   Argentina Donovan, PA-C  ?gabapentin (NEURONTIN) 300 MG capsule Take 1 capsule (300 mg total) by mouth at bedtime. 11/09/21   Argentina Donovan, PA-C  ?ibuprofen (ADVIL) 200 MG tablet Take 400 mg by mouth every 6 (six) hours as needed for headache or moderate pain. ?Patient not taking: Reported on 04/20/2021    [provider]  ?meloxicam (MOBIC) 7.5 MG tablet Take 1 tablet (7.5 mg total) by mouth daily as needed for pain 11/09/21   Argentina Donovan, PA-C  ? ? ?ROS: ?Neg HEENT ?Neg resp ?Neg cardiac ?Neg GI ?Neg psych ?Neg neuro ? ?Objective:  ? ?Vitals:  ? 11/09/21 1432  ?BP: (!) 148/102  ?Pulse: 85  ?SpO2: 98%  ?Weight: 259 lb 12.8 oz (117.8 kg)  ? ?Exam ?General appearance : Awake, alert, not in any distress. Speech Clear. Not toxic looking ?HEENT: Atraumatic and Normocephalic ?Neck: Supple, no JVD. No cervical lymphadenopathy.  ?Chest: Good air entry bilaterally, CTAB.  No rales/rhonchi/wheezing ?CVS: S1 S2 regular, no murmurs.  ?Abdomen: obese abdomen; bowel sounds present, Non tender and not distended with no gaurding, rigidity or rebound. No megaly or discrete mass ?Extremities: B/L Lower Ext shows no edema, both legs are warm to touch ?Neurology: Awake alert,  and oriented X 3, CN II-XII intact, Non focal ?Skin: No Rash ? ?Data Review ?Lab Results  ?Component Value Date  ? HGBA1C 5.3 07/07/2020  ? ? ?Assessment & Plan  ? ?1. Lower abdominal pain ?Non-acute abdomen.  R/O mass.  No FH colon CA ?- POCT URINALYSIS DIP (CLINITEK) ?- meloxicam (MOBIC) 7.5 MG tablet; Take 1 tablet (7.5 mg total) by mouth daily as needed for pain  Dispense: 30 tablet; Refill: 0 ?- Comprehensive metabolic panel ?- CBC with Differential/Platelet ?- Lipase ?- CT Abdomen Pelvis Wo Contrast; Future ?- cyclobenzaprine (FLEXERIL) 10 MG tablet; Take 0.5 tablets (5 mg total) by mouth 3 (three) times daily as needed  for muscle spasms.  Dispense: 40 tablet; Refill: 0 ? ?2. Essential hypertension ?Resume BP.  Check BP OOO and record ?- amLODipine (NORVASC) 5 MG tablet; TAKE 1 TABLET (5 MG TOTAL) BY MOUTH DAILY.  Dispense: 30 tablet; Refill: 3 ?- Comprehensive metabolic panel ?- CBC with Differential/Platelet ? ?3. SOB (shortness of breath) ?Not new ?- albuterol (VENTOLIN HFA) 108 (90 Base) MCG/ACT inhaler; Inhale 2 puffs into the lungs every 6 (six) hours as needed for wheezing or shortness of breath.  Dispense: 8.5 g; Refill: 0 ?- gabapentin (NEURONTIN) 300 MG capsule; Take 1 capsule (300 mg total) by mouth at bedtime.  Dispense: 30 capsule; Refill: 1 ? ?4. Neuropathy ?stable ?- gabapentin (NEURONTIN) 300 MG capsule; Take 1 capsule (300 mg total) by mouth at bedtime.  Dispense: 30 capsule; Refill: 1 ?- cyclobenzaprine (FLEXERIL) 10 MG tablet; Take 0.5 tablets (5 mg total) by mouth 3 (three) times daily as needed for muscle spasms.  Dispense: 40 tablet; Refill: 0 ? ?5. Constipation, unspecified constipation type ?Increase water and fiber intake ? ?6. Non-compliance ?Compliance imperative ? ?7. Family history of breast cancer ?- MM Digital Screening; Future ? ?8. Muscle spasm of left lower extremity ?- cyclobenzaprine (FLEXERIL) 10 MG tablet; Take 0.5 tablets (5 mg total) by mouth 3 (three) times daily as needed for muscle spasms.  Dispense: 40 tablet; Refill: 0 ? ? ? ?Return in about 6 weeks (around 12/21/2021) for DR Wynetta Emery recheck BP and abdominal pain. ? ?The patient was given clear instructions to go to ER or return to medical center if symptoms don't improve, worsen or new problems develop. The patient verbalized understanding. The patient was told to call to get lab results if they haven't heard anything in the next week.  ? ? ? ? ?Freeman Caldron, PA-C ?[place] ?[place], [place] ?[phone]   ?11/09/2021, 2:53 PM  ?

## 2021-11-09 NOTE — Patient Instructions (Addendum)
Drink 80-100 ounces water daily.  Your urine is very dehydrated.   ? ?Abdominal Pain, Adult ?Many things can cause belly (abdominal) pain. Most times, belly pain is not dangerous. Many cases of belly pain can be watched and treated at home. Sometimes, though, belly pain is serious. Your doctor will try to find the cause of your belly pain. ?Follow these instructions at home: ? ?Medicines ?Take over-the-counter and prescription medicines only as told by your doctor. ?Do not take medicines that help you poop (laxatives) unless told by your doctor. ?General instructions ?Watch your belly pain for any changes. ?Drink enough fluid to keep your pee (urine) pale yellow. ?Keep all follow-up visits as told by your doctor. This is important. ?Contact a doctor if: ?Your belly pain changes or gets worse. ?You are not hungry, or you lose weight without trying. ?You are having trouble pooping (constipated) or have watery poop (diarrhea) for more than 2-3 days. ?You have pain when you pee or poop. ?Your belly pain wakes you up at night. ?Your pain gets worse with meals, after eating, or with certain foods. ?You are vomiting and cannot keep anything down. ?You have a fever. ?You have blood in your pee. ?Get help right away if: ?Your pain does not go away as soon as your doctor says it should. ?You cannot stop vomiting. ?Your pain is only in areas of your belly, such as the right side or the left lower part of the belly. ?You have bloody or black poop, or poop that looks like tar. ?You have very bad pain, cramping, or bloating in your belly. ?You have signs of not having enough fluid or water in your body (dehydration), such as: ?Dark pee, very little pee, or no pee. ?Cracked lips. ?Dry mouth. ?Sunken eyes. ?Sleepiness. ?Weakness. ?You have trouble breathing or chest pain. ?Summary ?Many cases of belly pain can be watched and treated at home. ?Watch your belly pain for any changes. ?Take over-the-counter and prescription medicines  only as told by your doctor. ?Contact a doctor if your belly pain changes or gets worse. ?Get help right away if you have very bad pain, cramping, or bloating in your belly. ?This information is not intended to replace advice given to you by your health care provider. Make sure you discuss any questions you have with your health care provider. ?Document Revised: 11/04/2018 Document Reviewed: 11/04/2018 ?Elsevier Patient Education ? [place]. ? ?

## 2021-11-10 LAB — CBC WITH DIFFERENTIAL/PLATELET
Basophils Absolute: 0.1 10*3/uL (ref 0.0–0.2)
Basos: 1 %
EOS (ABSOLUTE): 0.5 10*3/uL — ABNORMAL HIGH (ref 0.0–0.4)
Eos: 8 %
Hematocrit: 38.5 % (ref 34.0–46.6)
Hemoglobin: 13.5 g/dL (ref 11.1–15.9)
Immature Grans (Abs): 0 10*3/uL (ref 0.0–0.1)
Immature Granulocytes: 0 %
Lymphocytes Absolute: 2.1 10*3/uL (ref 0.7–3.1)
Lymphs: 39 %
MCH: 33 pg (ref 26.6–33.0)
MCHC: 35.1 g/dL (ref 31.5–35.7)
MCV: 94 fL (ref 79–97)
Monocytes Absolute: 0.5 10*3/uL (ref 0.1–0.9)
Monocytes: 10 %
Neutrophils Absolute: 2.3 10*3/uL (ref 1.4–7.0)
Neutrophils: 42 %
Platelets: 275 10*3/uL (ref 150–450)
RBC: 4.09 x10E6/uL (ref 3.77–5.28)
RDW: 12.9 % (ref 11.7–15.4)
WBC: 5.5 10*3/uL (ref 3.4–10.8)

## 2021-11-10 LAB — COMPREHENSIVE METABOLIC PANEL
ALT: 20 IU/L (ref 0–32)
AST: 19 IU/L (ref 0–40)
Albumin/Globulin Ratio: 1.7 (ref 1.2–2.2)
Albumin: 3.9 g/dL (ref 3.8–4.8)
Alkaline Phosphatase: 68 IU/L (ref 44–121)
BUN/Creatinine Ratio: 18 (ref 9–23)
BUN: 16 mg/dL (ref 6–24)
Bilirubin Total: 0.3 mg/dL (ref 0.0–1.2)
CO2: 23 mmol/L (ref 20–29)
Calcium: 8.9 mg/dL (ref 8.7–10.2)
Chloride: 109 mmol/L — ABNORMAL HIGH (ref 96–106)
Creatinine, Ser: 0.87 mg/dL (ref 0.57–1.00)
Globulin, Total: 2.3 g/dL (ref 1.5–4.5)
Glucose: 85 mg/dL (ref 70–99)
Potassium: 4.9 mmol/L (ref 3.5–5.2)
Sodium: 141 mmol/L (ref 134–144)
Total Protein: 6.2 g/dL (ref 6.0–8.5)
eGFR: 84 mL/min/{1.73_m2} (ref >59)

## 2021-11-10 LAB — LIPASE: Lipase: 26 U/L (ref 14–72)

## 2021-11-16 ENCOUNTER — Telehealth: Payer: Self-pay | Admitting: Internal Medicine

## 2021-11-16 NOTE — Telephone Encounter (Signed)
Copied from [place] [id number]. Topic: General - Other ?>> Nov 16, 2021  9:03 AM Antonieta Iba C wrote: ?Reason for CRM: pt was told by imaging that authorization is needed in order to complete/schedule imaging for patient. ? ?Please assist pt further. ?

## 2021-11-16 NOTE — Telephone Encounter (Signed)
Copied from [place] [phone]. Topic: General - Other ?>> Nov 16, 2021  9:03 AM Antonieta Iba C wrote: ?Reason for CRM: pt was told by imaging that authorization is needed in order to complete/schedule imaging for patient. ? ?Please assist pt further. ?

## 2021-11-17 NOTE — Telephone Encounter (Signed)
Was informed by [facility] authorization specialist that patient walked in for imaging and was informed that she would need PA prior to imaging.  ? ?PA is done at [facility] for ordered test.  ?They will call patient once PA is completed.  ? ?Attempt to call patient to inform at all of her listed contact and unable to reach at neither. No voicemail set up.  ?

## 2021-11-18 NOTE — Telephone Encounter (Signed)
No answer.  Voicemail not set up. 

## 2021-11-22 NOTE — Telephone Encounter (Signed)
Unable to reach either of the 4 contacts on file.  ?1st-VM not set up.  ?2nd-VM not set up, phone just rings. ?3rd- recording states VM not set up ?4th- VM not set up, phone just rings. ? ?Will mail letter to address on file-  ?We are trying to contact you to schedule an appointment for your imaging from your recent visit.  ? ?Please call our office at your convenience.  ? ?Sincerely, ? ?[facility] [place] ?  ?

## 2021-11-22 NOTE — Telephone Encounter (Signed)
Pt called back from [phone] FYI ?

## 2021-11-23 NOTE — Telephone Encounter (Signed)
Lisa Haley would like a call back in regards to the CT scheduled for 6/7 ? ?Please call [phone]  ?Case number [id number] ?

## 2021-11-25 ENCOUNTER — Telehealth: Payer: Self-pay | Admitting: Internal Medicine

## 2021-11-25 NOTE — Telephone Encounter (Signed)
Dr. Wynetta Emery, message from Jolly Mango from [facility] sent via secure chat states:  An authorization to [facility] for this patients CT scan was submitted- however, they are not wanting to approve it for CT Abd/Pelv WO contrast but will approve it for [facility] if the provider agrees with this.   Please advise.

## 2021-11-25 NOTE — Telephone Encounter (Signed)
Routing to PCP for review.

## 2021-11-25 NOTE — Telephone Encounter (Signed)
Copied from [place] [phone]. Topic: General - Other >> Nov 25, 2021 12:36 PM Valere Dross wrote: Reason for CRM: Christella Scheuermann called in to let PCP know that the CT Abdomen Pelvis Wo Contrast was denied, and want wanted to let someone know, CB: [phone] ref #[id number].

## 2021-11-30 ENCOUNTER — Other Ambulatory Visit: Payer: Self-pay | Admitting: Physician Assistant

## 2021-11-30 DIAGNOSIS — K5900 Constipation, unspecified: Secondary | ICD-10-CM

## 2021-11-30 DIAGNOSIS — R1030 Lower abdominal pain, unspecified: Secondary | ICD-10-CM

## 2021-11-30 NOTE — Telephone Encounter (Signed)
New PA for patient-[facility] notified of changes to schedule patient for an apt.   CPT- [id number]- CT A&P with contrast Reference number- [id number] Prior Authorization number- [id number] Dates - 11/30/2021-05/29/2022   Spoke to [employer], from [facility] to make aware of updates to patient PA.

## 2021-11-30 NOTE — Telephone Encounter (Signed)
The order needs to go to [facility]

## 2021-12-02 ENCOUNTER — Other Ambulatory Visit: Payer: Self-pay

## 2021-12-14 ENCOUNTER — Ambulatory Visit
Admission: RE | Admit: 2021-12-14 | Discharge: 2021-12-14 | Disposition: A | Discharge status: Home / Self Care | Payer: Commercial Managed Care - HMO | Source: Ambulatory Visit | Attending: Physician Assistant | Admitting: Physician Assistant

## 2021-12-14 ENCOUNTER — Other Ambulatory Visit: Payer: Commercial Managed Care - HMO

## 2021-12-14 DIAGNOSIS — R1030 Lower abdominal pain, unspecified: Secondary | ICD-10-CM

## 2021-12-14 DIAGNOSIS — K5900 Constipation, unspecified: Secondary | ICD-10-CM

## 2021-12-14 MED ORDER — IOPAMIDOL (ISOVUE-300) INJECTION 61%
100.0000 mL | Freq: Once | INTRAVENOUS | Status: AC | PRN
  Administered 2021-12-14: 100 mL via INTRAVENOUS

## 2021-12-16 ENCOUNTER — Other Ambulatory Visit: Payer: Self-pay | Admitting: Physician Assistant

## 2021-12-16 ENCOUNTER — Telehealth: Payer: Self-pay | Admitting: *Deleted

## 2021-12-16 DIAGNOSIS — N83209 Unspecified ovarian cyst, unspecified side: Secondary | ICD-10-CM

## 2021-12-16 DIAGNOSIS — R1030 Lower abdominal pain, unspecified: Secondary | ICD-10-CM

## 2021-12-16 DIAGNOSIS — E278 Other specified disorders of adrenal gland: Secondary | ICD-10-CM

## 2021-12-16 NOTE — Telephone Encounter (Signed)
Pt called in wanting to get her results, and requested a call back, please advise.

## 2021-12-16 NOTE — Telephone Encounter (Signed)
Pt made additional call to go over imaging results.

## 2021-12-16 NOTE — Telephone Encounter (Signed)
Cheryl: [facility] Calling to make sure provider has report-CT scan-abdomen pelvis   IMPRESSION: 1. Slightly tubular cystic area in the left adnexa measuring up to 2.3 cm in diameter. Findings may be related to dilated fallopian tube or cystic left ovarian lesion. Recommend further evaluation with pelvic ultrasound. 2. Sigmoid colon diverticulosis without evidence for acute diverticulitis. 3. Indeterminate left adrenal nodule. Recommend further evaluation with adrenal CT or MRI.

## 2021-12-19 ENCOUNTER — Telehealth: Payer: Self-pay | Admitting: *Deleted

## 2021-12-19 DIAGNOSIS — N9489 Other specified conditions associated with female genital organs and menstrual cycle: Secondary | ICD-10-CM

## 2021-12-19 NOTE — Telephone Encounter (Signed)
Patient was given lab results to most recent lab test.   Pls advise-  Please place order for CT Scan for f/u adrenal gland to [facility].   Request a work note for days she missed from work due to abdominal pain. 6/10-6/12  Request pain medication for abdominal discomfort.

## 2021-12-20 ENCOUNTER — Ambulatory Visit: Payer: Self-pay

## 2021-12-20 NOTE — Telephone Encounter (Signed)
  Chief Complaint: severe abdominal pain Symptoms: pain to left side of abdomen- radiates to right side of abdomen, back pain, nausea, "gas in chest" Frequency: started gradually 4 months ago Pertinent Negatives: Patient denies diarrhea, fever, urination pain, vomiting Disposition: '[x]'$ ED /'[]'$ Urgent Care (no appt availability in office) / '[]'$ Appointment(In office/virtual)/ '[]'$  [facility]/ '[]'$ Home Care/ '[]'$ Refused Recommended Disposition /'[]'$ [place] [facility]/ '[]'$  Follow-up with PCP Additional Notes: advised pt to be driven to ED now  Reason for Disposition  [1] SEVERE pain (e.g., excruciating) AND [2] present > 1 hour  Answer Assessment - Initial Assessment Questions 1. LOCATION: "Where does it hurt?"      Left side of stomach occasionally on the right side of abd 2. RADIATION: "Does the pain shoot anywhere else?" (e.g., chest, back)     Back and down side of both legs 3. ONSET: "When did the pain begin?" (e.g., minutes, hours or days ago)      4 months ago  4. SUDDEN: "Gradual or sudden onset?"     gradual 5. PATTERN "Does the pain come and go, or is it constant?"    - If constant: "Is it getting better, staying the same, or worsening?"      (Note: Constant means the pain never goes away completely; most serious pain is constant and it progresses)     - If intermittent: "How long does it last?" "Do you have pain now?"     (Note: Intermittent means the pain goes away completely between bouts)     constant 6. SEVERITY: "How bad is the pain?"  (e.g., Scale 1-10; mild, moderate, or severe)   - MILD (1-3): doesn't interfere with normal activities, abdomen soft and not tender to touch    - MODERATE (4-7): interferes with normal activities or awakens from sleep, abdomen tender to touch    - SEVERE (8-10): excruciating pain, doubled over, unable to do any normal activities      severe 7. RECURRENT SYMPTOM: "Have you ever had this type of stomach pain before?" If Yes, ask: "When was  the last time?" and "What happened that time?"      Yes- before hysterectomy a few years ago 8. CAUSE: "What do you think is causing the stomach pain?"     Cyst on ovary  9. RELIEVING/AGGRAVATING FACTORS: "What makes it better or worse?" (e.g., movement, antacids, bowel movement)     Movement  10. OTHER SYMPTOMS: "Do you have any other symptoms?" (e.g., back pain, diarrhea, fever, urination pain, vomiting)       Back pain, nausea, gas in chest  Protocols used: Abdominal Pain - [facility]

## 2021-12-20 NOTE — Telephone Encounter (Signed)
Called pt and did let her know about the mobile clinic and she also has appt on thru She doesn't have transportation now and is at work, told her if we can do anything to give [place] a call

## 2021-12-22 ENCOUNTER — Ambulatory Visit: Payer: Managed Care, Other (non HMO) | Admitting: Internal Medicine

## 2021-12-22 ENCOUNTER — Encounter: Payer: Self-pay | Admitting: *Deleted

## 2021-12-23 ENCOUNTER — Other Ambulatory Visit: Payer: Commercial Managed Care - HMO

## 2021-12-29 ENCOUNTER — Other Ambulatory Visit: Payer: Self-pay | Admitting: Physician Assistant

## 2021-12-29 ENCOUNTER — Ambulatory Visit
Admission: RE | Admit: 2021-12-29 | Discharge: 2021-12-29 | Disposition: A | Discharge status: Home / Self Care | Payer: Commercial Managed Care - HMO | Source: Ambulatory Visit | Attending: Internal Medicine | Admitting: Internal Medicine

## 2021-12-29 ENCOUNTER — Other Ambulatory Visit: Payer: Self-pay

## 2021-12-29 DIAGNOSIS — M62838 Other muscle spasm: Secondary | ICD-10-CM

## 2021-12-29 DIAGNOSIS — N9489 Other specified conditions associated with female genital organs and menstrual cycle: Secondary | ICD-10-CM

## 2021-12-29 DIAGNOSIS — G629 Polyneuropathy, unspecified: Secondary | ICD-10-CM

## 2021-12-29 DIAGNOSIS — R1030 Lower abdominal pain, unspecified: Secondary | ICD-10-CM

## 2021-12-29 MED ORDER — MELOXICAM 7.5 MG PO TABS
7.5000 mg | ORAL_TABLET | Freq: Every day | ORAL | 0 refills | Status: DC
  Filled 2021-12-29 – 2022-01-03 (×2): qty 30, 30d supply, fill #0

## 2021-12-29 MED ORDER — CYCLOBENZAPRINE HCL 10 MG PO TABS
5.0000 mg | ORAL_TABLET | Freq: Three times a day (TID) | ORAL | 0 refills | Status: DC | PRN
  Filled 2021-12-29 – 2022-01-03 (×2): qty 40, 27d supply, fill #0

## 2021-12-30 ENCOUNTER — Telehealth: Payer: Self-pay | Admitting: *Deleted

## 2021-12-30 ENCOUNTER — Other Ambulatory Visit: Payer: Self-pay | Admitting: Family Medicine

## 2021-12-30 DIAGNOSIS — N838 Other noninflammatory disorders of ovary, fallopian tube and broad ligament: Secondary | ICD-10-CM

## 2021-12-30 NOTE — Telephone Encounter (Signed)
I have spoken to the patient on the phone about this.

## 2022-01-01 ENCOUNTER — Telehealth: Payer: Self-pay | Admitting: Internal Medicine

## 2022-01-02 ENCOUNTER — Ambulatory Visit: Payer: Self-pay

## 2022-01-03 ENCOUNTER — Other Ambulatory Visit: Payer: Self-pay

## 2022-01-03 ENCOUNTER — Other Ambulatory Visit (HOSPITAL_COMMUNITY): Payer: Self-pay

## 2022-01-03 ENCOUNTER — Other Ambulatory Visit: Payer: Self-pay | Admitting: Physician Assistant

## 2022-01-03 DIAGNOSIS — R0602 Shortness of breath: Secondary | ICD-10-CM

## 2022-01-03 DIAGNOSIS — G629 Polyneuropathy, unspecified: Secondary | ICD-10-CM

## 2022-01-03 MED ORDER — ACETAMINOPHEN ER 650 MG PO TBCR
650.0000 mg | EXTENDED_RELEASE_TABLET | Freq: Three times a day (TID) | ORAL | 1 refills | Status: DC | PRN
Start: 2022-01-03 — End: 2022-02-03
  Filled 2022-01-03 – 2022-01-31 (×3): qty 60, 20d supply, fill #0

## 2022-01-03 MED ORDER — MELOXICAM 7.5 MG PO TABS
ORAL_TABLET | ORAL | 0 refills | Status: DC
  Filled 2022-01-03: qty 30, 30d supply, fill #0

## 2022-01-03 MED ORDER — GABAPENTIN 300 MG PO CAPS
ORAL_CAPSULE | ORAL | 1 refills | Status: DC
  Filled 2022-01-03: qty 30, 30d supply, fill #0

## 2022-01-04 ENCOUNTER — Telehealth: Payer: Self-pay | Admitting: *Deleted

## 2022-01-04 NOTE — Telephone Encounter (Signed)
Spoke with the patient and scheduled a new patient with Dr Berline Lopes on 7/13 at 9:45 am. Patient given an arrival time of 9:15 am. Patient also given the address and phone number for the clinic; along with the policy for visitors.

## 2022-01-05 ENCOUNTER — Telehealth: Payer: Self-pay

## 2022-01-05 NOTE — Telephone Encounter (Signed)
Pt has returned call to Opal Sidles, wants fu call [phone]

## 2022-01-05 NOTE — Telephone Encounter (Signed)
Calls placed to patient to discuss transportation needs.  # [phone] - the recording stated that the call could not be completed at this time # [phone]- message left with call back requested

## 2022-01-09 NOTE — Telephone Encounter (Signed)
Call returned to patient # [phone] and the phone just rings fast busy

## 2022-01-14 ENCOUNTER — Other Ambulatory Visit (HOSPITAL_COMMUNITY): Payer: Self-pay

## 2022-01-17 NOTE — Telephone Encounter (Signed)
I called patient # [phone] again and the phone just rings fast busy

## 2022-01-18 ENCOUNTER — Encounter: Payer: Self-pay | Admitting: Gynecologic Oncology

## 2022-01-18 ENCOUNTER — Telehealth: Payer: Self-pay

## 2022-01-18 NOTE — Telephone Encounter (Signed)
Pt called office to reschedule her new pt appointment from 7/13 stating she does not have a ride.   Pt now scheduled for 7/18 @ 9:00. Ok per [employer]

## 2022-01-19 ENCOUNTER — Ambulatory Visit: Payer: Commercial Managed Care - HMO | Admitting: Gynecologic Oncology

## 2022-01-19 DIAGNOSIS — N838 Other noninflammatory disorders of ovary, fallopian tube and broad ligament: Secondary | ICD-10-CM

## 2022-01-19 NOTE — Telephone Encounter (Signed)
Pt has called back and wants return call, she gives [place] another # to call, cb to [phone]. She will be expecting your call after 1:00 today 7/13.

## 2022-01-19 NOTE — Telephone Encounter (Signed)
I attempted to contact patient again.  I called [phone] and the phone rings fast busy.  I then called [phone] and Franchot Gallo answered. He said he was not with her but he would have her return my call. He stated that she has the phone number for this clinic.   I need to explain to her that if she does not have transportation to her medical appointments, and her insurance does not provides rides to the appointments, she can request cab transportation from the clinic that she needs the ride to.

## 2022-01-19 NOTE — Telephone Encounter (Signed)
I spoke to the patient and she said she is all set with transportation now. She called DSS and registered for transportation. She said she is eligible for transportation with her family planning Medicaid.   I explained to her that cab transportation can be arranged by the [facility] clinic she is going to if there are no other options. She said she understood.  I also confirmed her upcoming appointment with Dr Wynetta Emery  - 02/03/2022.

## 2022-01-20 ENCOUNTER — Telehealth: Payer: Self-pay

## 2022-01-20 NOTE — Telephone Encounter (Signed)
Pt called office stating she will be using [employer] to get her for her appointment on Tuesday 01/24/22, In order for someone to come with her a note has to be faxed to the transportation company stating it is ok for 1 person to ride with her.  Pt gave me the fax number of [phone]  Note was faxed and confirmed. Pt aware

## 2022-01-23 ENCOUNTER — Telehealth: Payer: Self-pay | Admitting: *Deleted

## 2022-01-23 ENCOUNTER — Telehealth: Payer: Self-pay | Admitting: Internal Medicine

## 2022-01-23 NOTE — Telephone Encounter (Addendum)
Spoke with pt this morning who stated that she is unable to get assistance from her social worker in setting up transportation to her appt with [place] tomorrow due to the type of insurance she has. Contacted Kurtis Bushman at registration to ask and see if [facility] can set up transportation for pt. Magda Paganini reached out to [place] who will set pt up and will wait for approval from the program.

## 2022-01-23 NOTE — Telephone Encounter (Signed)
   Lisa Haley DOB: 1975-12-17 MRN: [id number]   RIDER WAIVER AND RELEASE OF LIABILITY  For purposes of improving physical access to our facilities, [facility] is pleased to partner with third parties to provide [place] patients or other authorized individuals the option of convenient, on-demand ground transportation services (the [place]") through use of the technology service that enables users to request on-demand ground transportation from independent third-party providers.  By opting to use and accept these [employer], I, the undersigned, hereby agree on behalf of myself, and on behalf of any minor child using the Government social research officer for whom I am the parent or legal guardian, as follows:  Government social research officer provided to me are provided by independent third-party transportation providers who are not [employer] or employees and who are unaffiliated with [employer]. [place] is neither a transportation carrier nor a common or public carrier. [place] has no control over the quality or safety of the transportation that occurs as a result of the [employer]. [place] cannot guarantee that any third-party transportation provider will complete any arranged transportation service. [place] [facility] makes no representation, warranty, or guarantee regarding the reliability, timeliness, quality, safety, suitability, or availability of any of the Transport Services or that they will be error free. I fully understand that traveling by vehicle involves risks and dangers of serious bodily injury, including permanent disability, paralysis, and death. I agree, on behalf of myself and on behalf of any minor child using the Transport Services for whom I am the parent or legal guardian, that the entire risk arising out of my use of the [employer] remains solely with me, to the maximum extent permitted under applicable law. The [employer] are provided  "as is" and "as available." [facility] disclaims all representations and warranties, express, implied or statutory, not expressly set out in these terms, including the implied warranties of merchantability and fitness for a particular purpose. I hereby waive and release [place], its agents, employees, officers, directors, representatives, insurers, attorneys, assigns, successors, subsidiaries, and affiliates from any and all past, present, or future claims, demands, liabilities, actions, causes of action, or suits of any kind directly or indirectly arising from acceptance and use of the [employer]. I further waive and release [place] and its affiliates from all present and future liability and responsibility for any injury or death to persons or damages to property caused by or related to the use of the [employer]. I have read this Waiver and Release of Liability, and I understand the terms used in it and their legal significance. This Waiver is freely and voluntarily given with the understanding that my right (as well as the right of any minor child for whom I am the parent or legal guardian using the [employer]) to legal recourse against  in connection with the [employer] is knowingly surrendered in return for use of these services.   I attest that I read the consent document to Lisa Haley, gave Lisa Haley the opportunity to ask questions and answered the questions asked (if any). I affirm that Lisa Haley then provided consent for she's participation in this program.     Lisa Haley

## 2022-01-24 ENCOUNTER — Encounter: Payer: Self-pay | Admitting: Gynecologic Oncology

## 2022-01-24 ENCOUNTER — Inpatient Hospital Stay: Payer: Commercial Managed Care - HMO | Attending: Gynecologic Oncology | Admitting: Gynecologic Oncology

## 2022-01-24 ENCOUNTER — Other Ambulatory Visit: Payer: Self-pay

## 2022-01-24 ENCOUNTER — Inpatient Hospital Stay: Payer: Commercial Managed Care - HMO

## 2022-01-24 ENCOUNTER — Inpatient Hospital Stay (HOSPITAL_BASED_OUTPATIENT_CLINIC_OR_DEPARTMENT_OTHER): Payer: Commercial Managed Care - HMO | Admitting: Gynecologic Oncology

## 2022-01-24 ENCOUNTER — Other Ambulatory Visit: Payer: Self-pay | Admitting: Gynecologic Oncology

## 2022-01-24 VITALS — BP 131/89 | HR 90 | Temp 98.5°F | Resp 18 | Ht 62.0 in | Wt 252.6 lb

## 2022-01-24 DIAGNOSIS — Z79899 Other long term (current) drug therapy: Secondary | ICD-10-CM | POA: Insufficient documentation

## 2022-01-24 DIAGNOSIS — Z8673 Personal history of transient ischemic attack (TIA), and cerebral infarction without residual deficits: Secondary | ICD-10-CM | POA: Diagnosis not present

## 2022-01-24 DIAGNOSIS — Z6841 Body Mass Index (BMI) 40.0 and over, adult: Secondary | ICD-10-CM | POA: Diagnosis not present

## 2022-01-24 DIAGNOSIS — N838 Other noninflammatory disorders of ovary, fallopian tube and broad ligament: Secondary | ICD-10-CM

## 2022-01-24 DIAGNOSIS — I10 Essential (primary) hypertension: Secondary | ICD-10-CM | POA: Diagnosis not present

## 2022-01-24 DIAGNOSIS — R1900 Intra-abdominal and pelvic swelling, mass and lump, unspecified site: Secondary | ICD-10-CM | POA: Diagnosis not present

## 2022-01-24 DIAGNOSIS — Z800 Family history of malignant neoplasm of digestive organs: Secondary | ICD-10-CM | POA: Insufficient documentation

## 2022-01-24 DIAGNOSIS — R6881 Early satiety: Secondary | ICD-10-CM | POA: Diagnosis not present

## 2022-01-24 DIAGNOSIS — F17210 Nicotine dependence, cigarettes, uncomplicated: Secondary | ICD-10-CM | POA: Insufficient documentation

## 2022-01-24 DIAGNOSIS — R102 Pelvic and perineal pain: Secondary | ICD-10-CM | POA: Insufficient documentation

## 2022-01-24 DIAGNOSIS — E669 Obesity, unspecified: Secondary | ICD-10-CM | POA: Insufficient documentation

## 2022-01-24 DIAGNOSIS — N9489 Other specified conditions associated with female genital organs and menstrual cycle: Secondary | ICD-10-CM | POA: Insufficient documentation

## 2022-01-24 LAB — CEA (IN HOUSE-CHCC): CEA (CHCC-In House): <1 ng/mL (ref 0.00–5.00)

## 2022-01-24 NOTE — Progress Notes (Signed)
GYNECOLOGIC ONCOLOGY NEW PATIENT CONSULTATION   Patient Name: Starnisha Batrez  Patient Age: 46 y.o. Date of Service: 01/24/2022 Referring Provider:Dr. Charlott Rakes  Primary Care Provider: Lafonda Mosses, MD Consulting Provider: Jeral Pinch, MD   Assessment/Plan:  Premenopausal patient with complex symptomatic adnexal mass.  Discussed with patient imaging findings on recent CT and pelvic ultrasound.  We reviewed the images together.  She has a complex 4-5 cm pelvic mass.  There are features on pelvic ultrasound that raise the concern for possible malignancy.  Given her significant pelvic symptoms as well as the appearance of the mass, I recommend that we proceed with diagnostic and therapeutic surgery.  We will plan to get tumor markers today.  Discussed that these are not diagnostic but rather help with surgical planning.  In terms of surgery, we discussed plan for robotic unilateral salpingo-oophorectomy.  Plan will be to remove the abnormal adnexa, placed in Endo Catch bag for contained cyst drainage, and then to send the adnexa for frozen section.  If benign, then no additional procedures would be performed.  In the setting of a borderline tumor, we discussed additional staging including peritoneal biopsies and omentectomy.  In the event of malignancy, patient may also need lymph node sampling.  We discussed that removal of her contralateral ovary, as she already had 1 ovary removed at the time of her hysterectomy, would result in surgical menopause.  I recommended that we wait until her final pathology is back and at her phone visit approximately 1 week after surgery, we can discuss possibility of starting hormone replacement therapy.  Given her medical history of a stroke, will reach out to her primary care provider about clearance for major surgery.  Specifically, would like to assure that she is a candidate to receive subcutaneous heparin given hemorrhagic stroke history.   Discussed with her the importance of good blood pressure control in the perioperative period.  We discussed the plan for robotic assisted unilateral salpingo-oophorectomy, possible staging, possible laparotomy. The risks of surgery were discussed in detail and she understands these to include infection; wound separation; hernia; injury to adjacent organs such as bowel, bladder, blood vessels, ureters and nerves; bleeding which may require blood transfusion; anesthesia risk; thromboembolic events; possible death; unforeseen complications; possible need for re-exploration; medical complications such as heart attack, stroke, pleural effusion and pneumonia; and, if full lymphadenectomy is performed the risk of lymphedema and lymphocyst. The patient will receive DVT and antibiotic prophylaxis as indicated. She voiced a clear understanding. She had the opportunity to ask questions. Perioperative instructions were reviewed with her. Prescriptions for post-op medications were sent to her pharmacy of choice.  A copy of this note was sent to the patient's referring provider.   65 minutes of total time was spent for this patient encounter, including preparation, face-to-face counseling with the patient and coordination of care, and documentation of the encounter.   Jeral Pinch, MD  Division of Gynecologic Oncology  Department of Obstetrics and Gynecology  [facility]  ___________________________________________  Chief Complaint: Chief Complaint  Patient presents with   Ovarian mass    History of Present Illness:  Daziya Redmond is a 46 y.o. y.o. female who is seen in consultation at the request of Dr. Margarita Rana for an evaluation of a complex adnexal mass.  Patient was seen by her primary care provider in June.  At that time, she endorsed lower abdominal pain and some constipation.  Today, she notes having left-sided pelvic pain for approximately  6 months.  She did not  have insurance at the time that her pain started and so she was not able to seek care until more recently.  She notes that pain happened mostly while she was at work.  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  Pain feels similar to when she previously had a cyst on her right ovary.  She denies any worsening of the pain with time.  Notes that now that she is not working, pain has not been as frequent.  She describes it as starting as throbbing, then becomes sharp, then notes that the pain just "sits there".  She takes some over-the-counter medications like Tylenol or NSAIDs which help take the edge off of the pain and then often takes a nap.  She also is using muscle relaxers for her symptoms.  She notes some radiation down her left leg as well as some knee pain.  She denies any nausea or emesis.  She endorses a normal appetite.  She denies any recent weight changes.  Endorses some intermittent bloating as well as early satiety.  Reports regular bowel function.  Pelvic ultrasound exam dating back to August 2015 showed surgically absent uterus and cervix.  Right ovary not identified.  Left ovary measured 3.6 x 3 x 3 cm with 2 separate cysts, 1 simple cyst measuring up to 1.5 cm and 1 complex cyst measuring up to 2.2 cm.  Complex cyst demonstrates homogenous low-level increased echoes internally with peripheral blood flow demonstrated.  No central flow or nodularity noted.  CT of the abdomen and pelvis on 12/14/2021 showed a slightly tubular cystic structure in the left adnexa measuring up to 2.3 cm in diameter, thought to represent either fallopian tube or cystic ovarian lesion.  Sigmoid colon diverticulosis without acute diverticulitis noted.  Indeterminate left adrenal nodule with recommendation for further evaluation with adrenal CT or MRI.  Follow-up pelvic ultrasound exam on 7/40/8144 shows a complicated cystic lesion in the left adnexa measuring up to 4.8 cm  with an irregular septation and slight nodularity as well as few scattered internal echoes.  Patient's history is notable for hypertension.  She had a hemorrhagic stroke at the end of 2021, was hospitalized at that time.  She has been more careful about being on blood pressure medications recently.  Surgical history is notable for total hysterectomy and removal of the right adnexa via Pfannenstiel incision.  This was done in her 35s for heavy bleeding.  She also had a tubal ligation.  Family history is notable for pancreatic cancer in her maternal great aunt.  She has a maternal distant cousin who also has a history of cancer, unsure what type.  Patient uses THC approximately every other day.  She is working on quitting tobacco, down to 3 cigarettes a day.  Endorses occasional alcohol consumption.  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     PAST SURGICAL HISTORY:  Past Surgical History:  Procedure Laterality Date   ABDOMINAL HYSTERECTOMY     in 30s for heavy bleeding, RSO   IR ANGIO INTRA EXTRACRAN SEL INTERNAL CAROTID BILAT MOD SED  07/06/2020   IR ANGIO INTRA EXTRACRAN SEL INTERNAL CAROTID BILAT MOD SED  07/12/2020   IR ANGIO VERTEBRAL SEL VERTEBRAL BILAT MOD SED  07/06/2020  IR ANGIO VERTEBRAL SEL VERTEBRAL BILAT MOD SED  07/12/2020   RADIOLOGY WITH ANESTHESIA N/A 07/06/2020   Procedure: IR WITH ANESTHESIA;  Surgeon: Consuella Lose, MD;  Location: [place];  Service: Radiology;  Laterality: N/A;   TUBAL LIGATION     WRIST SURGERY      OB/GYN HISTORY:  OB History  Gravida Para Term Preterm AB Living  4 3          SAB IAB Ectopic Multiple Live Births               # Outcome Date GA Lbr Len/2nd Weight Sex Delivery Anes PTL Lv  4 Gravida           3 Para           2 Para           1 Para             No LMP recorded. Patient has had a hysterectomy.  Age at  menarche: 6 Age at menopause: Not applicable Hx of HRT: Not applicable Hx of STDs: Denies Last pap: Unsure, thinks before her hysterectomy History of abnormal pap smears: Denies  SCREENING STUDIES:  Last mammogram: Has never had  Last colonoscopy: Has never had Last bone mineral density: Has never had  MEDICATIONS: Outpatient Encounter Medications as of 01/24/2022  Medication Sig   acetaminophen (TYLENOL 8 HOUR) 650 MG CR tablet Take 1 tablet (650 mg total) by mouth every 8 (eight) hours as needed for pain.   albuterol (VENTOLIN HFA) 108 (90 Base) MCG/ACT inhaler Inhale 2 puffs into the lungs every 6 (six) hours as needed for wheezing or shortness of breath.   amLODipine (NORVASC) 5 MG tablet TAKE 1 TABLET (5 MG TOTAL) BY MOUTH DAILY.   cetirizine (ZYRTEC) 10 MG tablet Take 1 tablet (10 mg total) by mouth daily.   cyclobenzaprine (FLEXERIL) 10 MG tablet Take 1/2 tablet (5 mg total) by mouth 3 (three) times daily as needed for muscle spasms.   gabapentin (NEURONTIN) 300 MG capsule Take 1 capsule (300 mg total) by mouth at bedtime.   meloxicam (MOBIC) 7.5 MG tablet Take 1 tablet (7.5 mg total) by mouth daily as needed for pain   No facility-administered encounter medications on file as of 01/24/2022.    ALLERGIES:  No Known Allergies   FAMILY HISTORY:  Family History  Problem Relation Age of Onset   Ovarian cancer Neg Hx    Uterine cancer Neg Hx    Breast cancer Neg Hx      SOCIAL HISTORY:  Social Connections: Not on file    REVIEW OF SYSTEMS:  + Ringing in the ears, hot flashes. Denies appetite changes, fevers, chills, fatigue, unexplained weight changes. Denies hearing loss, neck lumps or masses, mouth sores, or voice changes. Denies cough or wheezing.  Denies shortness of breath. Denies chest pain or palpitations. Denies leg swelling. Denies abdominal distention, pain, blood in stools, constipation, diarrhea, nausea, vomiting, or early satiety. Denies pain with  intercourse, dysuria, frequency, hematuria or incontinence. Denies pelvic pain, vaginal bleeding or vaginal discharge.   Denies joint pain, back pain or muscle pain/cramps. Denies itching, rash, or wounds. Denies dizziness, headaches, numbness or seizures. Denies swollen lymph nodes or glands, denies easy bruising or bleeding. Denies anxiety, depression, confusion, or decreased concentration.  Physical Exam:  Vital Signs for this encounter:  Blood pressure 131/89, pulse 90, temperature 98.5 F (36.9 C), temperature source Tympanic, resp. rate 18, height '5\' 2"'$  (1.575 m),  weight 252 lb 9.6 oz (114.6 kg), SpO2 100 %. Body mass index is 46.2 kg/m. General: Alert, oriented, no acute distress.  HEENT: Normocephalic, atraumatic. Sclera anicteric.  Chest: Clear to auscultation bilaterally. No wheezes, rhonchi, or rales. Cardiovascular: Regular rate and rhythm, no murmurs, rubs, or gallops.  Abdomen: Obese. Normoactive bowel sounds. Soft, nondistended, nontender to palpation. No masses or hepatosplenomegaly appreciated. No palpable fluid wave.  Well-healed Pfannenstiel incision. Extremities: Grossly normal range of motion. Warm, well perfused. No edema bilaterally.  Skin: No rashes or lesions.  Lymphatics: No cervical, supraclavicular, or inguinal adenopathy.  GU:  Normal external female genitalia. No lesions. No discharge or bleeding.             Bladder/urethra:  No lesions or masses, well supported bladder             Vagina: Well rugated, physiologic discharge noted.             Cervix/uterus: Surgically absent.             Adnexa: Somewhat limited by body habitus.  There is some mild fullness in the left cul-de-sac, smooth.  Rectal: Deferred.  LABORATORY AND RADIOLOGIC DATA:  Outside medical records were reviewed to synthesize the above history, along with the history and physical obtained during the visit.   Lab Results  Component Value Date   WBC 5.5 11/09/2021   HGB 13.5 11/09/2021    HCT 38.5 11/09/2021   PLT 275 11/09/2021   GLUCOSE 85 11/09/2021   TRIG 80 07/11/2020   ALT 20 11/09/2021   AST 19 11/09/2021   NA 141 11/09/2021   K 4.9 11/09/2021   CL 109 (H) 11/09/2021   CREATININE 0.87 11/09/2021   BUN 16 11/09/2021   CO2 23 11/09/2021   INR 1.0 07/06/2020   HGBA1C 5.3 07/07/2020

## 2022-01-24 NOTE — Patient Instructions (Addendum)
We will need to obtain clearance to proceed with surgery from your primary care provider prior to surgery  Preparing for your Surgery  Plan for surgery on February 14, 2022 (may be able to move to sooner date, we will reach out if time becomes available) with Dr. Jeral Pinch at [facility] will be scheduled for robotic assisted laparoscopic unilateral salpingo-oophorectomy (removal of one ovary and fallopian tube), possible staging if a cancer if identified, possible laparotomy (larger incision on your abdomen if needed).   Pre-operative Testing -You will receive a phone call from presurgical testing at [facility] to arrange for a pre-operative appointment and lab work.  -Bring your insurance card, copy of an advanced directive if applicable, medication list  -At that visit, you will be asked to sign a consent for a possible blood transfusion in case a transfusion becomes necessary during surgery.  The need for a blood transfusion is rare but having consent is a necessary part of your care.     -You should not be taking blood thinners or aspirin at least ten days prior to surgery unless instructed by your surgeon.  -Do not take supplements such as fish oil (omega 3), red yeast rice, turmeric before your surgery. You want to avoid medications with aspirin in them including headache powders such as BC or Goody's), Excedrin migraine.  Day Before Surgery at [facility] will be asked to take in a light diet the day before surgery. You will be advised you can have clear liquids up until 3 hours before your surgery.    Eat a light diet the day before surgery.  Examples including soups, broths, toast, yogurt, mashed potatoes.  AVOID GAS PRODUCING FOODS. Things to avoid include carbonated beverages (fizzy beverages, sodas), raw fruits and raw vegetables (uncooked), or beans.   If your bowels are filled with gas, your surgeon will have difficulty visualizing your pelvic organs  which increases your surgical risks.  Your role in recovery Your role is to become active as soon as directed by your doctor, while still giving yourself time to heal.  Rest when you feel tired. You will be asked to do the following in order to speed your recovery:  - Cough and breathe deeply. This helps to clear and expand your lungs and can prevent pneumonia after surgery.  - [place]. Do mild physical activity. Walking or moving your legs help your circulation and body functions return to normal. Do not try to get up or walk alone the first time after surgery.   -If you develop swelling on one leg or the other, pain in the back of your leg, redness/warmth in one of your legs, please call the office or go to the Emergency Room to have a doppler to rule out a blood clot. For shortness of breath, chest pain-seek care in the Emergency Room as soon as possible. - Actively manage your pain. Managing your pain lets you move in comfort. We will ask you to rate your pain on a scale of zero to 10. It is your responsibility to tell your doctor or nurse where and how much you hurt so your pain can be treated.  Special Considerations -If you are diabetic, you may be placed on insulin after surgery to have closer control over your blood sugars to promote healing and recovery.  This does not mean that you will be discharged on insulin.  If applicable, your oral antidiabetics will be resumed  when you are tolerating a solid diet.  -Your final pathology results from surgery should be available around one week after surgery and the results will be relayed to you when available.  -FMLA forms can be faxed to [phone] and please allow 5-7 business days for completion.  Pain Management After Surgery -You will be prescribed your pain medication and bowel regimen medications before surgery closer to the surgery date so that you can have these available when you are discharged from the hospital.  The pain medication is for use ONLY AFTER surgery and a new prescription will not be given.   -Make sure that you have Tylenol and Ibuprofen IF YOU ARE ABLE TO TAKE THESE MEDICATIONS at home to use on a regular basis after surgery for pain control. We recommend alternating the medications every hour to six hours since they work differently and are processed in the body differently for pain relief.  -Review the attached handout on narcotic use and their risks and side effects.   Bowel Regimen -You will be prescribed Sennakot-S to take nightly to prevent constipation especially if you are taking the narcotic pain medication intermittently.  It is important to prevent constipation and drink adequate amounts of liquids. You can stop taking this medication when you are not taking pain medication and you are back on your normal bowel routine.  Risks of Surgery Risks of surgery are low but include bleeding, infection, damage to surrounding structures, re-operation, blood clots, and very rarely death.   Blood Transfusion Information (For the consent to be signed before surgery)  We will be checking your blood type before surgery so in case of emergencies, we will know what type of blood you would need.                                            WHAT IS A BLOOD TRANSFUSION?  A transfusion is the replacement of blood or some of its parts. Blood is made up of multiple cells which provide different functions. Red blood cells carry oxygen and are used for blood loss replacement. White blood cells fight against infection. Platelets control bleeding. Plasma helps clot blood. Other blood products are available for specialized needs, such as hemophilia or other clotting disorders. BEFORE THE TRANSFUSION  Who gives blood for transfusions?  You may be able to donate blood to be used at a later date on yourself (autologous donation). Relatives can be asked to donate blood. This is generally not any safer  than if you have received blood from a stranger. The same precautions are taken to ensure safety when a relative's blood is donated. Healthy volunteers who are fully evaluated to make sure their blood is safe. This is blood bank blood. Transfusion therapy is the safest it has ever been in the practice of medicine. Before blood is taken from a donor, a complete history is taken to make sure that person has no history of diseases nor engages in risky social behavior (examples are intravenous drug use or sexual activity with multiple partners). The donor's travel history is screened to minimize risk of transmitting infections, such as malaria. The donated blood is tested for signs of infectious diseases, such as HIV and hepatitis. The blood is then tested to be sure it is compatible with you in order to minimize the chance of a transfusion reaction. If you or a relative  donates blood, this is often done in anticipation of surgery and is not appropriate for emergency situations. It takes many days to process the donated blood. RISKS AND COMPLICATIONS Although transfusion therapy is very safe and saves many lives, the main dangers of transfusion include:  Getting an infectious disease. Developing a transfusion reaction. This is an allergic reaction to something in the blood you were given. Every precaution is taken to prevent this. The decision to have a blood transfusion has been considered carefully by your caregiver before blood is given. Blood is not given unless the benefits outweigh the risks.  AFTER SURGERY INSTRUCTIONS  Return to work: 4-6 weeks if applicable  Activity: 1. Be up and out of the bed during the day.  Take a nap if needed.  You may walk up steps but be careful and use the hand rail.  Stair climbing will tire you more than you think, you may need to stop part way and rest.   2. No lifting or straining for 6 weeks over 10 pounds. No pushing, pulling, straining for 6 weeks.  3. No  driving for around 1 week(s).  Do not drive if you are taking narcotic pain medicine and make sure that your reaction time has returned.   4. You can shower as soon as the next day after surgery. Shower daily.  Use your regular soap and water (not directly on the incision) and pat your incision(s) dry afterwards; don't rub.  No tub baths or submerging your body in water until cleared by your surgeon. If you have the soap that was given to you by pre-surgical testing that was used before surgery, you do not need to use it afterwards because this can irritate your incisions.   5. No sexual activity and nothing in the vagina for 4 weeks.  6. You may experience a small amount of clear drainage from your incisions, which is normal.  If the drainage persists, increases, or changes color please call the office.  7. Do not use creams, lotions, or ointments such as neosporin on your incisions after surgery until advised by your surgeon because they can cause removal of the dermabond glue on your incisions.    8. Take Tylenol or ibuprofen first for pain if you are able to take these medications and only use narcotic pain medication for severe pain not relieved by the Tylenol or Ibuprofen.  Monitor your Tylenol intake to a max of 4,000 mg in a 24 hour period. You can alternate these medications after surgery.  Diet: 1. Low sodium Heart Healthy Diet is recommended but you are cleared to resume your normal (before surgery) diet after your procedure.  2. It is safe to use a laxative, such as Miralax or Colace, if you have difficulty moving your bowels. You will be prescribed Sennakot-S to take at bedtime every evening after surgery to keep bowel movements regular and to prevent constipation.    Wound Care: 1. Keep clean and dry.  Shower daily.  Reasons to call the Doctor: Fever - Oral temperature greater than 100.4 degrees Fahrenheit Foul-smelling vaginal discharge Difficulty urinating Nausea and  vomiting Increased pain at the site of the incision that is unrelieved with pain medicine. Difficulty breathing with or without chest pain New calf pain especially if only on one side Sudden, continuing increased vaginal bleeding with or without clots.   Contacts: For questions or concerns you should contact:  Dr. Jeral Pinch at [phone]  Joylene John, NP at [phone]  After Hours: call [phone] and have the GYN Oncologist paged/contacted (after 5 pm or on the weekends).  Messages sent via mychart are for non-urgent matters and are not responded to after hours so for urgent needs, please call the after hours number.

## 2022-01-24 NOTE — H&P (View-Only) (Signed)
GYNECOLOGIC ONCOLOGY NEW PATIENT CONSULTATION   Patient Name: Lisa Haley  Patient Age: 46 y.o. Date of Service: 01/24/2022 Referring Provider:Dr. Charlott Rakes  Primary Care Provider: Lafonda Mosses, MD Consulting Provider: Jeral Pinch, MD   Assessment/Plan:  Premenopausal patient with complex symptomatic adnexal mass.  Discussed with patient imaging findings on recent CT and pelvic ultrasound.  We reviewed the images together.  She has a complex 4-5 cm pelvic mass.  There are features on pelvic ultrasound that raise the concern for possible malignancy.  Given her significant pelvic symptoms as well as the appearance of the mass, I recommend that we proceed with diagnostic and therapeutic surgery.  We will plan to get tumor markers today.  Discussed that these are not diagnostic but rather help with surgical planning.  In terms of surgery, we discussed plan for robotic unilateral salpingo-oophorectomy.  Plan will be to remove the abnormal adnexa, placed in Endo Catch bag for contained cyst drainage, and then to send the adnexa for frozen section.  If benign, then no additional procedures would be performed.  In the setting of a borderline tumor, we discussed additional staging including peritoneal biopsies and omentectomy.  In the event of malignancy, patient may also need lymph node sampling.  We discussed that removal of her contralateral ovary, as she already had 1 ovary removed at the time of her hysterectomy, would result in surgical menopause.  I recommended that we wait until her final pathology is back and at her phone visit approximately 1 week after surgery, we can discuss possibility of starting hormone replacement therapy.  Given her medical history of a stroke, will reach out to her primary care provider about clearance for major surgery.  Specifically, would like to assure that she is a candidate to receive subcutaneous heparin given hemorrhagic stroke history.   Discussed with her the importance of good blood pressure control in the perioperative period.  We discussed the plan for robotic assisted unilateral salpingo-oophorectomy, possible staging, possible laparotomy. The risks of surgery were discussed in detail and she understands these to include infection; wound separation; hernia; injury to adjacent organs such as bowel, bladder, blood vessels, ureters and nerves; bleeding which may require blood transfusion; anesthesia risk; thromboembolic events; possible death; unforeseen complications; possible need for re-exploration; medical complications such as heart attack, stroke, pleural effusion and pneumonia; and, if full lymphadenectomy is performed the risk of lymphedema and lymphocyst. The patient will receive DVT and antibiotic prophylaxis as indicated. She voiced a clear understanding. She had the opportunity to ask questions. Perioperative instructions were reviewed with her. Prescriptions for post-op medications were sent to her pharmacy of choice.  A copy of this note was sent to the patient's referring provider.   65 minutes of total time was spent for this patient encounter, including preparation, face-to-face counseling with the patient and coordination of care, and documentation of the encounter.   Jeral Pinch, MD  Division of Gynecologic Oncology  Department of Obstetrics and Gynecology  [facility]  ___________________________________________  Chief Complaint: Chief Complaint  Patient presents with   Ovarian mass    History of Present Illness:  Lisa Haley is a 46 y.o. y.o. female who is seen in consultation at the request of Dr. Margarita Rana for an evaluation of a complex adnexal mass.  Patient was seen by her primary care provider in June.  At that time, she endorsed lower abdominal pain and some constipation.  Today, she notes having left-sided pelvic pain for approximately  6 months.  She did not  have insurance at the time that her pain started and so she was not able to seek care until more recently.  She notes that pain happened mostly while she was at work.  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  Pain feels similar to when she previously had a cyst on her right ovary.  She denies any worsening of the pain with time.  Notes that now that she is not working, pain has not been as frequent.  She describes it as starting as throbbing, then becomes sharp, then notes that the pain just "sits there".  She takes some over-the-counter medications like Tylenol or NSAIDs which help take the edge off of the pain and then often takes a nap.  She also is using muscle relaxers for her symptoms.  She notes some radiation down her left leg as well as some knee pain.  She denies any nausea or emesis.  She endorses a normal appetite.  She denies any recent weight changes.  Endorses some intermittent bloating as well as early satiety.  Reports regular bowel function.  Pelvic ultrasound exam dating back to August 2015 showed surgically absent uterus and cervix.  Right ovary not identified.  Left ovary measured 3.6 x 3 x 3 cm with 2 separate cysts, 1 simple cyst measuring up to 1.5 cm and 1 complex cyst measuring up to 2.2 cm.  Complex cyst demonstrates homogenous low-level increased echoes internally with peripheral blood flow demonstrated.  No central flow or nodularity noted.  CT of the abdomen and pelvis on 12/14/2021 showed a slightly tubular cystic structure in the left adnexa measuring up to 2.3 cm in diameter, thought to represent either fallopian tube or cystic ovarian lesion.  Sigmoid colon diverticulosis without acute diverticulitis noted.  Indeterminate left adrenal nodule with recommendation for further evaluation with adrenal CT or MRI.  Follow-up pelvic ultrasound exam on 02/16/9832 shows a complicated cystic lesion in the left adnexa measuring up to 4.8 cm  with an irregular septation and slight nodularity as well as few scattered internal echoes.  Patient's history is notable for hypertension.  She had a hemorrhagic stroke at the end of 2021, was hospitalized at that time.  She has been more careful about being on blood pressure medications recently.  Surgical history is notable for total hysterectomy and removal of the right adnexa via Pfannenstiel incision.  This was done in her 31s for heavy bleeding.  She also had a tubal ligation.  Family history is notable for pancreatic cancer in her maternal great aunt.  She has a maternal distant cousin who also has a history of cancer, unsure what type.  Patient uses THC approximately every other day.  She is working on quitting tobacco, down to 3 cigarettes a day.  Endorses occasional alcohol consumption.  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     PAST SURGICAL HISTORY:  Past Surgical History:  Procedure Laterality Date   ABDOMINAL HYSTERECTOMY     in 30s for heavy bleeding, RSO   IR ANGIO INTRA EXTRACRAN SEL INTERNAL CAROTID BILAT MOD SED  07/06/2020   IR ANGIO INTRA EXTRACRAN SEL INTERNAL CAROTID BILAT MOD SED  07/12/2020   IR ANGIO VERTEBRAL SEL VERTEBRAL BILAT MOD SED  07/06/2020  IR ANGIO VERTEBRAL SEL VERTEBRAL BILAT MOD SED  07/12/2020   RADIOLOGY WITH ANESTHESIA N/A 07/06/2020   Procedure: IR WITH ANESTHESIA;  Surgeon: Consuella Lose, MD;  Location: [facility];  Service: Radiology;  Laterality: N/A;   TUBAL LIGATION     WRIST SURGERY      OB/GYN HISTORY:  OB History  Gravida Para Term Preterm AB Living  4 3          SAB IAB Ectopic Multiple Live Births               # Outcome Date GA Lbr Len/2nd Weight Sex Delivery Anes PTL Lv  4 Gravida           3 Para           2 Para           1 Para             No LMP recorded. Patient has had a hysterectomy.  Age at  menarche: 75 Age at menopause: Not applicable Hx of HRT: Not applicable Hx of STDs: Denies Last pap: Unsure, thinks before her hysterectomy History of abnormal pap smears: Denies  SCREENING STUDIES:  Last mammogram: Has never had  Last colonoscopy: Has never had Last bone mineral density: Has never had  MEDICATIONS: Outpatient Encounter Medications as of 01/24/2022  Medication Sig   acetaminophen (TYLENOL 8 HOUR) 650 MG CR tablet Take 1 tablet (650 mg total) by mouth every 8 (eight) hours as needed for pain.   albuterol (VENTOLIN HFA) 108 (90 Base) MCG/ACT inhaler Inhale 2 puffs into the lungs every 6 (six) hours as needed for wheezing or shortness of breath.   amLODipine (NORVASC) 5 MG tablet TAKE 1 TABLET (5 MG TOTAL) BY MOUTH DAILY.   cetirizine (ZYRTEC) 10 MG tablet Take 1 tablet (10 mg total) by mouth daily.   cyclobenzaprine (FLEXERIL) 10 MG tablet Take 1/2 tablet (5 mg total) by mouth 3 (three) times daily as needed for muscle spasms.   gabapentin (NEURONTIN) 300 MG capsule Take 1 capsule (300 mg total) by mouth at bedtime.   meloxicam (MOBIC) 7.5 MG tablet Take 1 tablet (7.5 mg total) by mouth daily as needed for pain   No facility-administered encounter medications on file as of 01/24/2022.    ALLERGIES:  No Known Allergies   FAMILY HISTORY:  Family History  Problem Relation Age of Onset   Ovarian cancer Neg Hx    Uterine cancer Neg Hx    Breast cancer Neg Hx      SOCIAL HISTORY:  Social Connections: Not on file    REVIEW OF SYSTEMS:  + Ringing in the ears, hot flashes. Denies appetite changes, fevers, chills, fatigue, unexplained weight changes. Denies hearing loss, neck lumps or masses, mouth sores, or voice changes. Denies cough or wheezing.  Denies shortness of breath. Denies chest pain or palpitations. Denies leg swelling. Denies abdominal distention, pain, blood in stools, constipation, diarrhea, nausea, vomiting, or early satiety. Denies pain with  intercourse, dysuria, frequency, hematuria or incontinence. Denies pelvic pain, vaginal bleeding or vaginal discharge.   Denies joint pain, back pain or muscle pain/cramps. Denies itching, rash, or wounds. Denies dizziness, headaches, numbness or seizures. Denies swollen lymph nodes or glands, denies easy bruising or bleeding. Denies anxiety, depression, confusion, or decreased concentration.  Physical Exam:  Vital Signs for this encounter:  Blood pressure 131/89, pulse 90, temperature 98.5 F (36.9 C), temperature source Tympanic, resp. rate 18, height '5\' 2"'$  (1.575 m),  weight 252 lb 9.6 oz (114.6 kg), SpO2 100 %. Body mass index is 46.2 kg/m. General: Alert, oriented, no acute distress.  HEENT: Normocephalic, atraumatic. Sclera anicteric.  Chest: Clear to auscultation bilaterally. No wheezes, rhonchi, or rales. Cardiovascular: Regular rate and rhythm, no murmurs, rubs, or gallops.  Abdomen: Obese. Normoactive bowel sounds. Soft, nondistended, nontender to palpation. No masses or hepatosplenomegaly appreciated. No palpable fluid wave.  Well-healed Pfannenstiel incision. Extremities: Grossly normal range of motion. Warm, well perfused. No edema bilaterally.  Skin: No rashes or lesions.  Lymphatics: No cervical, supraclavicular, or inguinal adenopathy.  GU:  Normal external female genitalia. No lesions. No discharge or bleeding.             Bladder/urethra:  No lesions or masses, well supported bladder             Vagina: Well rugated, physiologic discharge noted.             Cervix/uterus: Surgically absent.             Adnexa: Somewhat limited by body habitus.  There is some mild fullness in the left cul-de-sac, smooth.  Rectal: Deferred.  LABORATORY AND RADIOLOGIC DATA:  Outside medical records were reviewed to synthesize the above history, along with the history and physical obtained during the visit.   Lab Results  Component Value Date   WBC 5.5 11/09/2021   HGB 13.5 11/09/2021    HCT 38.5 11/09/2021   PLT 275 11/09/2021   GLUCOSE 85 11/09/2021   TRIG 80 07/11/2020   ALT 20 11/09/2021   AST 19 11/09/2021   NA 141 11/09/2021   K 4.9 11/09/2021   CL 109 (H) 11/09/2021   CREATININE 0.87 11/09/2021   BUN 16 11/09/2021   CO2 23 11/09/2021   INR 1.0 07/06/2020   HGBA1C 5.3 07/07/2020

## 2022-01-24 NOTE — Patient Instructions (Signed)
We will need to obtain clearance to proceed with surgery from your primary care provider prior to surgery   Preparing for your Surgery   Plan for surgery on February 14, 2022 (may be able to move to sooner date, we will reach out if time becomes available) with Dr. Jeral Pinch at [place] will be scheduled for robotic assisted laparoscopic unilateral salpingo-oophorectomy (removal of one ovary and fallopian tube), possible staging if a cancer if identified, possible laparotomy (larger incision on your abdomen if needed).    Pre-operative Testing -You will receive a phone call from presurgical testing at [facility] to arrange for a pre-operative appointment and lab work.   -Bring your insurance card, copy of an advanced directive if applicable, medication list   -At that visit, you will be asked to sign a consent for a possible blood transfusion in case a transfusion becomes necessary during surgery.  The need for a blood transfusion is rare but having consent is a necessary part of your care.      -You should not be taking blood thinners or aspirin at least ten days prior to surgery unless instructed by your surgeon.   -Do not take supplements such as fish oil (omega 3), red yeast rice, turmeric before your surgery. You want to avoid medications with aspirin in them including headache powders such as BC or Goody's), Excedrin migraine.   Day Before Surgery at [place] will be asked to take in a light diet the day before surgery. You will be advised you can have clear liquids up until 3 hours before your surgery.     Eat a light diet the day before surgery.  Examples including soups, broths, toast, yogurt, mashed potatoes.  AVOID GAS PRODUCING FOODS. Things to avoid include carbonated beverages (fizzy beverages, sodas), raw fruits and raw vegetables (uncooked), or beans.    If your bowels are filled with gas, your surgeon will have difficulty visualizing your pelvic  organs which increases your surgical risks.   Your role in recovery Your role is to become active as soon as directed by your doctor, while still giving yourself time to heal.  Rest when you feel tired. You will be asked to do the following in order to speed your recovery:   - Cough and breathe deeply. This helps to clear and expand your lungs and can prevent pneumonia after surgery.  - [place]. Do mild physical activity. Walking or moving your legs help your circulation and body functions return to normal. Do not try to get up or walk alone the first time after surgery.   -If you develop swelling on one leg or the other, pain in the back of your leg, redness/warmth in one of your legs, please call the office or go to the Emergency Room to have a doppler to rule out a blood clot. For shortness of breath, chest pain-seek care in the Emergency Room as soon as possible. - Actively manage your pain. Managing your pain lets you move in comfort. We will ask you to rate your pain on a scale of zero to 10. It is your responsibility to tell your doctor or nurse where and how much you hurt so your pain can be treated.   Special Considerations -If you are diabetic, you may be placed on insulin after surgery to have closer control over your blood sugars to promote healing and recovery.  This does not mean that you will  be discharged on insulin.  If applicable, your oral antidiabetics will be resumed when you are tolerating a solid diet.   -Your final pathology results from surgery should be available around one week after surgery and the results will be relayed to you when available.   -FMLA forms can be faxed to [phone] and please allow 5-7 business days for completion.   Pain Management After Surgery -You will be prescribed your pain medication and bowel regimen medications before surgery closer to the surgery date so that you can have these available when you are discharged from  the hospital. The pain medication is for use ONLY AFTER surgery and a new prescription will not be given.    -Make sure that you have Tylenol and Ibuprofen IF YOU ARE ABLE TO TAKE THESE MEDICATIONS at home to use on a regular basis after surgery for pain control. We recommend alternating the medications every hour to six hours since they work differently and are processed in the body differently for pain relief.   -Review the attached handout on narcotic use and their risks and side effects.    Bowel Regimen -You will be prescribed Sennakot-S to take nightly to prevent constipation especially if you are taking the narcotic pain medication intermittently.  It is important to prevent constipation and drink adequate amounts of liquids. You can stop taking this medication when you are not taking pain medication and you are back on your normal bowel routine.   Risks of Surgery Risks of surgery are low but include bleeding, infection, damage to surrounding structures, re-operation, blood clots, and very rarely death.     Blood Transfusion Information (For the consent to be signed before surgery)   We will be checking your blood type before surgery so in case of emergencies, we will know what type of blood you would need.                                             WHAT IS A BLOOD TRANSFUSION?   A transfusion is the replacement of blood or some of its parts. Blood is made up of multiple cells which provide different functions. Red blood cells carry oxygen and are used for blood loss replacement. White blood cells fight against infection. Platelets control bleeding. Plasma helps clot blood. Other blood products are available for specialized needs, such as hemophilia or other clotting disorders. BEFORE THE TRANSFUSION  Who gives blood for transfusions?  You may be able to donate blood to be used at a later date on yourself (autologous donation). Relatives can be asked to donate blood. This is  generally not any safer than if you have received blood from a stranger. The same precautions are taken to ensure safety when a relative's blood is donated. Healthy volunteers who are fully evaluated to make sure their blood is safe. This is blood bank blood. Transfusion therapy is the safest it has ever been in the practice of medicine. Before blood is taken from a donor, a complete history is taken to make sure that person has no history of diseases nor engages in risky social behavior (examples are intravenous drug use or sexual activity with multiple partners). The donor's travel history is screened to minimize risk of transmitting infections, such as malaria. The donated blood is tested for signs of infectious diseases, such as HIV and hepatitis. The blood is  then tested to be sure it is compatible with you in order to minimize the chance of a transfusion reaction. If you or a relative donates blood, this is often done in anticipation of surgery and is not appropriate for emergency situations. It takes many days to process the donated blood. RISKS AND COMPLICATIONS Although transfusion therapy is very safe and saves many lives, the main dangers of transfusion include:  Getting an infectious disease. Developing a transfusion reaction. This is an allergic reaction to something in the blood you were given. Every precaution is taken to prevent this. The decision to have a blood transfusion has been considered carefully by your caregiver before blood is given. Blood is not given unless the benefits outweigh the risks.   AFTER SURGERY INSTRUCTIONS   Return to work: 4-6 weeks if applicable   Activity: 1. Be up and out of the bed during the day.  Take a nap if needed.  You may walk up steps but be careful and use the hand rail.  Stair climbing will tire you more than you think, you may need to stop part way and rest.    2. No lifting or straining for 6 weeks over 10 pounds. No pushing, pulling,  straining for 6 weeks.   3. No driving for around 1 week(s).  Do not drive if you are taking narcotic pain medicine and make sure that your reaction time has returned.    4. You can shower as soon as the next day after surgery. Shower daily.  Use your regular soap and water (not directly on the incision) and pat your incision(s) dry afterwards; don't rub.  No tub baths or submerging your body in water until cleared by your surgeon. If you have the soap that was given to you by pre-surgical testing that was used before surgery, you do not need to use it afterwards because this can irritate your incisions.    5. No sexual activity and nothing in the vagina for 4 weeks.   6. You may experience a small amount of clear drainage from your incisions, which is normal.  If the drainage persists, increases, or changes color please call the office.   7. Do not use creams, lotions, or ointments such as neosporin on your incisions after surgery until advised by your surgeon because they can cause removal of the dermabond glue on your incisions.     8. Take Tylenol or ibuprofen first for pain if you are able to take these medications and only use narcotic pain medication for severe pain not relieved by the Tylenol or Ibuprofen.  Monitor your Tylenol intake to a max of 4,000 mg in a 24 hour period. You can alternate these medications after surgery.   Diet: 1. Low sodium Heart Healthy Diet is recommended but you are cleared to resume your normal (before surgery) diet after your procedure.   2. It is safe to use a laxative, such as Miralax or Colace, if you have difficulty moving your bowels. You will be prescribed Sennakot-S to take at bedtime every evening after surgery to keep bowel movements regular and to prevent constipation.     Wound Care: 1. Keep clean and dry.  Shower daily.   Reasons to call the Doctor: Fever - Oral temperature greater than 100.4 degrees Fahrenheit Foul-smelling vaginal  discharge Difficulty urinating Nausea and vomiting Increased pain at the site of the incision that is unrelieved with pain medicine. Difficulty breathing with or without chest pain New calf  pain especially if only on one side Sudden, continuing increased vaginal bleeding with or without clots.   Contacts: For questions or concerns you should contact:   Dr. Jeral Pinch at [phone]   Joylene John, NP at [phone]   After Hours: call [phone] and have the GYN Oncologist paged/contacted (after 5 pm or on the weekends).   Messages sent via mychart are for non-urgent matters and are not responded to after hours so for urgent needs, please call the after hours number.

## 2022-01-24 NOTE — Progress Notes (Signed)
Patient here for new patient consultation with Dr. Jeral Pinch and for a pre-operative discussion prior to her scheduled surgery on February 14, 2022. She is scheduled for robotic assisted laparoscopic unilateral salpingo-oophorectomy, possible staging if a cancer if identified, possible laparotomy. The surgery was discussed in detail.  See after visit summary for additional details.    Discussed post-op pain management in detail including the aspects of the enhanced recovery pathway. We discussed the use of tylenol post-op and to monitor for a maximum of 4,000 mg in a 24 hour period.  Also will prescribe sennakot to be used after surgery and to hold if having loose stools.  Discussed bowel regimen in detail. Medications will be sent in closer to the surgery date.   Discussed measures to take at home to prevent DVT including frequent mobility.  Reportable signs and symptoms of DVT discussed. Post-operative instructions discussed and expectations for after surgery. Incisional care discussed as well including reportable signs and symptoms including erythema, drainage, wound separation.     5 minutes spent with the patient.  Verbalizing understanding of material discussed. No needs or concerns voiced at the end of the visit.   Advised patient to call for any needs.    This appointment is included in the global surgical bundle as pre-operative teaching and has no charge.

## 2022-01-25 ENCOUNTER — Telehealth: Payer: Self-pay | Admitting: *Deleted

## 2022-01-25 LAB — CA 125: Cancer Antigen (CA) 125: 4.8 U/mL (ref 0.0–38.1)

## 2022-01-25 NOTE — Telephone Encounter (Signed)
Per Dr Tucker fax records and surgical optimization form to the patient's PCP  

## 2022-01-26 ENCOUNTER — Telehealth: Payer: Self-pay

## 2022-01-26 NOTE — Telephone Encounter (Signed)
Pt is aware of normal lab results from Tuesday 7/18

## 2022-01-27 ENCOUNTER — Telehealth: Payer: Self-pay

## 2022-01-27 ENCOUNTER — Other Ambulatory Visit: Payer: Self-pay

## 2022-01-27 ENCOUNTER — Ambulatory Visit: Payer: Self-pay | Admitting: *Deleted

## 2022-01-27 MED ORDER — TRAMADOL HCL 50 MG PO TABS
ORAL_TABLET | ORAL | 0 refills | Status: DC
  Filled 2022-01-27: qty 14, 7d supply, fill #0

## 2022-01-27 MED ORDER — TRAMADOL HCL 50 MG PO TABS: 50.0000 mg | ORAL_TABLET | Freq: Two times a day (BID) | ORAL | 0 refills | Status: DC | PRN

## 2022-01-27 NOTE — Telephone Encounter (Signed)
Called pt to get an update, she states she is still having sharpe shooting pain 10/10 on pain scale. She has been taking Tylenol but not much relief.   Per Joylene John NP advised to go to ER, If we order an ultrasound it will not be done until the beginning of next week. If pain is 10/10 she needs to seek care sooner. Pt aware.  She states she has no transportation, so it is hard for her to get places. She asked could we not just give her anything to help. I advised her if she goes to the ER, they can do a scan and figure out what is causing the pain and treat it appropriately. Pt states ok.  Pt aware I will notify Dr.Tucker and Melissa

## 2022-01-27 NOTE — Telephone Encounter (Signed)
Called pt and she is aware   She uses our pharmacy I will drop off script

## 2022-01-27 NOTE — Addendum Note (Signed)
Addended by: Karle Plumber B on: 01/27/2022 03:32 PM   Modules accepted: Orders

## 2022-01-27 NOTE — Telephone Encounter (Signed)
Received call from patient endorsing abdominal pain and left leg pain. She reports the pain has been ongoing "for months". She rates her pain 10/10.She denies any bleeding or discharge.   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 Pushing the heavy cart makes her pain worse.   Patient reports "tylenol is not working, I need pain meds."  Patient reports she called another providers office and was advised to contact her PCP. Patient reports her PCP is Karle Plumber, this has been updated in Epic.   Joylene John, NP notified. Ultrasound recommended. Patient is agreeable to this. Advised that we will work on getting this scheduled and someone from the office will call her with appointment details. Patient verbalized understanding.

## 2022-01-27 NOTE — Telephone Encounter (Signed)
Summary: cyst concerns / rx req   The patient has an ovarian cyst causing them discomfort   The patient will have surgery to remove the cyst on 02/14/22 but continues to experience discomfort   The patient has been prescribed Tylenol but shares they are ineffective and taking multiple Tylenol causes them stomach discomfort   The patient would like to be prescribed something for their discomfort   Please contact further       Chief Complaint: ovary cyst pain Symptoms: pelvic/abd/back pain Frequency: constant Pertinent Negatives: Patient denies this is new, it is just getting worse. Disposition: '[]'$ ED /'[]'$ Urgent Care (no appt availability in office) / '[]'$ Appointment(In office/virtual)/ '[]'$  [facility]/ '[]'$ Home Care/ '[]'$ Refused Recommended Disposition /'[]'$ [facility]/ '[x]'$  Follow-up with PCP Additional Notes: Pt seen end of June and has appt next week. She is having ovarian surgery which has been moved up to do increasing pain. Pt is calling asking for a prescription for pain med, not Tylenol.   Reason for Disposition  Abdominal pain is a chronic symptom (recurrent or ongoing AND present > 4 weeks)  Answer Assessment - Initial Assessment Questions 1. LOCATION: "Where does it hurt?"      Pelvis/abd 2. RADIATION: "Does the pain shoot anywhere else?" (e.g., chest, back)     Back and left leg 3. ONSET: "When did the pain begin?" (e.g., minutes, hours or days ago)      A long time 4. SUDDEN: "Gradual or sudden onset?"     Gradual but now worse 5. PATTERN "Does the pain come and go, or is it constant?"    - If it comes and goes: "How long does it last?" "Do you have pain now?"     (Note: Comes and goes means the pain is intermittent. It goes away completely between bouts.)    - If constant: "Is it getting better, staying the same, or getting worse?"      (Note: Constant means the pain never goes away completely; most serious pain is constant and gets worse.)      Getting  worse (moved surgery sooner due to pain) 6. SEVERITY: "How bad is the pain?"  (e.g., Scale 1-10; mild, moderate, or severe)    - MILD (1-3): Doesn't interfere with normal activities, abdomen soft and not tender to touch.     - MODERATE (4-7): Interferes with normal activities or awakens from sleep, abdomen tender to touch.     - SEVERE (8-10): Excruciating pain, doubled over, unable to do any normal activities.       10 7. RECURRENT SYMPTOM: "Have you ever had this type of stomach pain before?" If Yes, ask: "When was the last time?" and "What happened that time?"      Since ovarian cist 8. CAUSE: "What do you think is causing the stomach pain?"     Ovarian cyst, has oncologist, having surgery soon 9. RELIEVING/AGGRAVATING FACTORS: "What makes it better or worse?" (e.g., antacids, bending or twisting motion, bowel movement)     activity 10. OTHER SYMPTOMS: "Do you have any other symptoms?" (e.g., back pain, diarrhea, fever, urination pain, vomiting)       na 11. PREGNANCY: "Is there any chance you are pregnant?" "When was your last menstrual period?"       no  Protocols used: Abdominal Pain - [facility]

## 2022-01-27 NOTE — Telephone Encounter (Signed)
Routing to pcp

## 2022-01-30 ENCOUNTER — Other Ambulatory Visit: Payer: Self-pay | Admitting: Internal Medicine

## 2022-01-30 ENCOUNTER — Other Ambulatory Visit (HOSPITAL_COMMUNITY): Payer: Self-pay

## 2022-01-30 ENCOUNTER — Other Ambulatory Visit: Payer: Self-pay | Admitting: Physician Assistant

## 2022-01-30 DIAGNOSIS — G629 Polyneuropathy, unspecified: Secondary | ICD-10-CM

## 2022-01-30 DIAGNOSIS — I10 Essential (primary) hypertension: Secondary | ICD-10-CM

## 2022-01-30 DIAGNOSIS — R1030 Lower abdominal pain, unspecified: Secondary | ICD-10-CM

## 2022-01-30 DIAGNOSIS — M62838 Other muscle spasm: Secondary | ICD-10-CM

## 2022-01-30 NOTE — Telephone Encounter (Signed)
Following up with Ms. Skalla regarding her pain. She reports she is doing "good" today. She rates her pain as 5/10, She has been taking tylenol for pain. She has not taken tramadol that was prescribed on 01/27/22 by Dr. Wynetta Emery. Patient reports she has her medications delivered and has not received it yet. Advised patient she may also apply a heating pad to the affected area. Patient verbalized understanding.  Patient does not want to pursue an ultrasound at this time. "I know what the pain is from. 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 I don't need the ultrasound."   Patient inquiring if she still needs to come to her appointment on Thursday as she does not have transportation. Advised patient that Thursdays appointment is her pre-admission appointment at the hospital and she needs to keep this appointment. Transferred patient to transportation coordinator to arrange transportation. Instructed to call with any needs.

## 2022-01-31 ENCOUNTER — Other Ambulatory Visit (HOSPITAL_COMMUNITY): Payer: Self-pay

## 2022-01-31 ENCOUNTER — Other Ambulatory Visit: Payer: Self-pay | Admitting: Physician Assistant

## 2022-01-31 ENCOUNTER — Other Ambulatory Visit: Payer: Self-pay

## 2022-01-31 DIAGNOSIS — R0602 Shortness of breath: Secondary | ICD-10-CM

## 2022-01-31 DIAGNOSIS — G629 Polyneuropathy, unspecified: Secondary | ICD-10-CM

## 2022-01-31 MED ORDER — CYCLOBENZAPRINE HCL 10 MG PO TABS
5.0000 mg | ORAL_TABLET | Freq: Three times a day (TID) | ORAL | 0 refills | Status: DC | PRN
  Filled 2022-01-31: qty 40, 27d supply, fill #0

## 2022-01-31 MED ORDER — AMLODIPINE BESYLATE 5 MG PO TABS
ORAL_TABLET | Freq: Every day | ORAL | 1 refills | Status: DC
  Filled 2022-01-31: qty 90, 90d supply, fill #0

## 2022-01-31 MED ORDER — MELOXICAM 7.5 MG PO TABS
7.5000 mg | ORAL_TABLET | Freq: Every day | ORAL | 0 refills | Status: DC
  Filled 2022-01-31: qty 30, 30d supply, fill #0

## 2022-01-31 NOTE — Progress Notes (Signed)
COVID Vaccine received:  '[]'$  No '[x]'$  Yes  Date of any COVID positive Test in last 90 days:  none  PCP - Karle Plumber, MD Patient has appt on 02-03-22 for medical clearance by Dr. Wynetta Emery.   Cardiologist - None  Chest x-ray -  2018 Epic EKG -  02-02-22  at PST  Stress Test - none ECHO - none Cardiac Cath - none  Pacemaker/ICD device last checked: Date:       '[x]'$  N/A Spinal Cord Stimulator:'[x]'$  No '[]'$  Yes   Other Implants:   Bowel Prep - None except don't eat gas producing food.   History of Sleep Apnea? '[x]'$  No '[]'$  Yes  '[]'$  unknown Sleep Study Date:  never  Does the patient monitor blood sugar? '[]'$  No '[]'$  Yes  '[x]'$  N/A  Blood Thinner Instructions: None d/t hx of SAH  Activity level:  Can go up a flight of stairs and perform activities of daily living without stopping and without symptoms of chest pain or shortness of breath.'[x]'$  No '[]'$  Yes  '[]'$  N/A  Patient able to complete ADLs without assistance '[]'$  No '[x]'$  Yes  '[]'$  N/A    Comments:   Anesthesia review: Hx SAH- Hemorrhagic CVA 2021,  Patient denies shortness of breath, fever, cough and chest pain at PAT appointment.Patient verbalized understanding and agreement to the Pre-Surgical Instructions that were given to them at this PAT appointment. Patient was also educated of the need to review these PAT instructions again prior to his/her surgery.I reviewed the appropriate phone numbers to call if they have any and questions or concerns.

## 2022-01-31 NOTE — Telephone Encounter (Signed)
Requested medication (s) are due for refill today: yes  Requested medication (s) are on the active medication list: yes  Last refill:  12/29/21 #40  Future visit scheduled: yes  Notes to clinic:  med not delegated to NT to RF    Requested Prescriptions  Pending Prescriptions Disp Refills   cyclobenzaprine (FLEXERIL) 10 MG tablet 40 tablet     Sig: Take 1/2 tablet (5 mg total) by mouth 3 (three) times daily as needed for muscle spasms.     Not Delegated - Analgesics:  Muscle Relaxants Failed - 01/30/2022  8:45 AM      Failed - This refill cannot be delegated      Passed - Valid encounter within last 6 months    Recent Outpatient Visits           2 months ago Lower abdominal pain   [place], [place], [place]   9 months ago Neuropathy   [place], [place], [place]   1 year ago Left hip pain   [place], [place], [place]   1 year ago Need for influenza vaccination   [place], RPH-CPP   1 year ago Hospital discharge follow-up   [place] Ladell Pier, MD       Future Appointments             In 3 days Ladell Pier, MD [place]            Signed Prescriptions Disp Refills   meloxicam (MOBIC) 7.5 MG tablet 30 tablet 0    Sig: Take 1 tablet (7.5 mg total) by mouth daily as needed for pain     Analgesics:  COX2 Inhibitors Failed - 01/30/2022  8:45 AM      Failed - Manual Review: Labs are only required if the patient has taken medication for more than 8 weeks.      Passed - HGB in normal range and within 360 days    Hemoglobin  Date Value Ref Range Status  11/09/2021 13.5 11.1 - 15.9 g/dL Final         Passed - Cr in normal range and within 360 days    Creatinine, Ser  Date Value Ref Range Status  11/09/2021 0.87 0.57 - 1.00  mg/dL Final         Passed - HCT in normal range and within 360 days    Hematocrit  Date Value Ref Range Status  11/09/2021 38.5 34.0 - 46.6 % Final         Passed - AST in normal range and within 360 days    AST  Date Value Ref Range Status  11/09/2021 19 0 - 40 IU/L Final         Passed - ALT in normal range and within 360 days    ALT  Date Value Ref Range Status  11/09/2021 20 0 - 32 IU/L Final         Passed - eGFR is 30 or above and within 360 days    GFR calc Af Amer  Date Value Ref Range Status  08/08/2016 >60 >60 mL/min Final    Comment:    (NOTE) The eGFR has been calculated using the CKD EPI equation. This calculation has not been validated in all clinical situations. eGFR's persistently <  60 mL/min signify possible Chronic Kidney Disease.    GFR, Estimated  Date Value Ref Range Status  12/07/2020 >60 >60 mL/min Final    Comment:    (NOTE) Calculated using the CKD-EPI Creatinine Equation (2021)    eGFR  Date Value Ref Range Status  11/09/2021 84 >59 mL/min/1.73 Final         Passed - Patient is not pregnant      Passed - Valid encounter within last 12 months    Recent Outpatient Visits           2 months ago Lower abdominal pain   [place], [place]   9 months ago Neuropathy   [place], [place]   1 year ago Left hip pain   [place], [place], [place]   1 year ago Need for influenza vaccination   [place], [place], RPH-CPP   1 year ago Hospital discharge follow-up   [place], MD       Future Appointments             In 3 days Ladell Pier, MD [place]

## 2022-01-31 NOTE — Patient Instructions (Signed)
DUE TO SPACE LIMITATIONS, ONLY TWO VISITORS  (aged 45 and older) ARE ALLOWED TO COME WITH YOU AND STAY IN THE WAITING ROOM DURING YOUR PRE OP AND PROCEDURE.   **NO VISITORS ARE ALLOWED IN THE SHORT STAY AREA OR RECOVERY ROOM!!**  You are not required to quarantine at this time prior to your surgery. However, you must do this: Hand Hygiene often Do NOT share personal items Notify your provider if you are in close contact with someone who has COVID or you develop fever 100.4 or greater, new onset of sneezing, cough, sore throat, shortness of breath or body aches.       Your procedure is scheduled on:  Tuesday February 14, 2022  Report to [facility] Main Entrance.  Report to admitting at:  05:15   AM  +++++Call this number if you have any questions or problems the morning of surgery [phone]  Do not eat food :After Midnight the night prior to your surgery/procedure.  After Midnight you may have the following liquids until   04:30 AM DAY OF SURGERY  Clear Liquid Diet Water Black Coffee (sugar ok, NO MILK/CREAM OR CREAMERS)  Tea (sugar ok, NO MILK/CREAM OR CREAMERS) regular and decaf                             Plain Jell-O (NO RED)                                           Fruit ices (not with fruit pulp, NO RED)                                     Popsicles (NO RED)                                                                  Juice: apple, WHITE grape, WHITE cranberry Sports drinks like Gatorade (NO RED)               FOLLOW BOWEL PREP AND ANY ADDITIONAL PRE OP INSTRUCTIONS YOU RECEIVED FROM YOUR SURGEON'S OFFICE!!!   Oral Hygiene is also important to reduce your risk of infection.        Remember - BRUSH YOUR TEETH THE MORNING OF SURGERY WITH YOUR REGULAR TOOTHPASTE  Do NOT smoke after Midnight the night before surgery.  Take ONLY these medicines the morning of surgery with A SIP OF WATER: Amlodipine, If needed you may take, Tylenol, Tramadol, Zyrtec and use your  Albuterol Inhaler.   Bring CPAP mask and tubing day of surgery.                   You may not have any metal on your body including hair pins, jewelry, and body piercing  Do not wear make-up, lotions, powders, perfumes or deodorant  Do not wear nail polish including gel and S&S, artificial / acrylic nails, or any other type of covering on natural nails including finger and toenails. If you have artificial nails, gel coating, etc., that needs to  be removed by a nail salon, Please have this removed prior to surgery. Not doing so may mean that your surgery could be cancelled or delayed if the Surgeon or anesthesia staff feels like they are unable to monitor you safely.   Do not shave 48 hours prior to surgery to avoid nicks in your skin which may contribute to postoperative infections.   Contacts, Hearing Aids, dentures or bridgework may not be worn into surgery.   DO NOT [place]. PHARMACY WILL DISPENSE MEDICATIONS LISTED ON YOUR MEDICATION LIST TO YOU DURING YOUR ADMISSION [place]!   Patients discharged on the day of surgery will not be allowed to drive home.  Someone NEEDS to stay with you for the first 24 hours after anesthesia.  Special Instructions: Bring a copy of your healthcare power of attorney and living will documents the day of surgery, if you wish to have them scanned into your [facility] Records- EPIC  Please read over the following fact sheets you were given: IF YOU HAVE QUESTIONS ABOUT YOUR PRE-OP INSTRUCTIONS, PLEASE CALL [phone]  ([place])   [facility] - Preparing for Surgery Before surgery, you can play an important role.  Because skin is not sterile, your skin needs to be as free of germs as possible.  You can reduce the number of germs on your skin by washing with CHG (chlorahexidine gluconate) soap before surgery.  CHG is an antiseptic cleaner which kills germs and bonds with the skin to continue killing germs even  after washing. Please DO NOT use if you have an allergy to CHG or antibacterial soaps.  If your skin becomes reddened/irritated stop using the CHG and inform your nurse when you arrive at Short Stay. Do not shave (including legs and underarms) for at least 48 hours prior to the first CHG shower.  You may shave your face/neck.  Please follow these instructions carefully:  1.  Shower with CHG Soap the night before surgery and the  morning of surgery.  2.  If you choose to wash your hair, wash your hair first as usual with your normal  shampoo.  3.  After you shampoo, rinse your hair and body thoroughly to remove the shampoo.                             4.  Use CHG as you would any other liquid soap.  You can apply chg directly to the skin and wash.  Gently with a scrungie or clean washcloth.  5.  Apply the CHG Soap to your body ONLY FROM THE NECK DOWN.   Do not use on face/ open                           Wound or open sores. Avoid contact with eyes, ears mouth and genitals (private parts).                       Wash face,  Genitals (private parts) with your normal soap.             6.  Wash thoroughly, paying special attention to the area where your  surgery  will be performed.  7.  Thoroughly rinse your body with warm water from the neck down.  8.  DO NOT shower/wash with your normal soap after using and rinsing off the CHG Soap.  9.  Pat yourself dry with a clean towel.            10.  Wear clean pajamas.            11.  Place clean sheets on your bed the night of your first shower and do not  sleep with pets.  ON THE DAY OF SURGERY : Do not apply any lotions/deodorants the morning of surgery.  Please wear clean clothes to the hospital/surgery center.    FAILURE TO FOLLOW THESE INSTRUCTIONS MAY RESULT IN THE CANCELLATION OF YOUR SURGERY  PATIENT SIGNATURE_________________________________  NURSE  SIGNATURE__________________________________  ________________________________________________________________________

## 2022-01-31 NOTE — Telephone Encounter (Signed)
Requested Prescriptions  Pending Prescriptions Disp Refills  . meloxicam (MOBIC) 7.5 MG tablet 30 tablet 0    Sig: Take 1 tablet (7.5 mg total) by mouth daily as needed for pain     Analgesics:  COX2 Inhibitors Failed - 01/30/2022  8:45 AM      Failed - Manual Review: Labs are only required if the patient has taken medication for more than 8 weeks.      Passed - HGB in normal range and within 360 days    Hemoglobin  Date Value Ref Range Status  11/09/2021 13.5 11.1 - 15.9 g/dL Final         Passed - Cr in normal range and within 360 days    Creatinine, Ser  Date Value Ref Range Status  11/09/2021 0.87 0.57 - 1.00 mg/dL Final         Passed - HCT in normal range and within 360 days    Hematocrit  Date Value Ref Range Status  11/09/2021 38.5 34.0 - 46.6 % Final         Passed - AST in normal range and within 360 days    AST  Date Value Ref Range Status  11/09/2021 19 0 - 40 IU/L Final         Passed - ALT in normal range and within 360 days    ALT  Date Value Ref Range Status  11/09/2021 20 0 - 32 IU/L Final         Passed - eGFR is 30 or above and within 360 days    GFR calc Af Amer  Date Value Ref Range Status  08/08/2016 >60 >60 mL/min Final    Comment:    (NOTE) The eGFR has been calculated using the CKD EPI equation. This calculation has not been validated in all clinical situations. eGFR's persistently <60 mL/min signify possible Chronic Kidney Disease.    GFR, Estimated  Date Value Ref Range Status  12/07/2020 >60 >60 mL/min Final    Comment:    (NOTE) Calculated using the CKD-EPI Creatinine Equation (2021)    eGFR  Date Value Ref Range Status  11/09/2021 84 >59 mL/min/1.73 Final         Passed - Patient is not pregnant      Passed - Valid encounter within last 12 months    Recent Outpatient Visits          2 months ago Lower abdominal pain   [facility] [place], [place], [place]   9 months ago Neuropathy   [place], [place]   1 year ago Left hip pain   [place], [place], [place]   1 year ago Need for influenza vaccination   [place], [place], RPH-CPP   1 year ago Hospital discharge follow-up   [place], MD      Future Appointments            In 3 days Ladell Pier, MD [place]           . cyclobenzaprine (FLEXERIL) 10 MG tablet 40 tablet     Sig: Take 1/2 tablet (5 mg total) by mouth 3 (three) times daily as needed for muscle spasms.     Not Delegated - Analgesics:  Muscle Relaxants Failed - 01/30/2022  8:45 AM      Failed - This refill cannot be delegated      Passed - Valid encounter within last 6 months    Recent Outpatient Visits          2 months ago Lower abdominal pain   [place], [place]   9 months ago Neuropathy   [place], [place]   1 year ago Left hip pain   [place], [place], [place]   1 year ago Need for influenza vaccination   [place], [place], RPH-CPP   1 year ago Hospital discharge follow-up   [place], MD      Future Appointments            In 3 days Ladell Pier, MD [place]

## 2022-01-31 NOTE — Telephone Encounter (Signed)
Requested Prescriptions  Pending Prescriptions Disp Refills  . amLODipine (NORVASC) 5 MG tablet 90 tablet 1    Sig: TAKE 1 TABLET (5 MG TOTAL) BY MOUTH DAILY.     Cardiovascular: Calcium Channel Blockers 2 Passed - 01/30/2022  8:45 AM      Passed - Last BP in normal range    BP Readings from Last 1 Encounters:  01/24/22 131/89         Passed - Last Heart Rate in normal range    Pulse Readings from Last 1 Encounters:  01/24/22 90         Passed - Valid encounter within last 6 months    Recent Outpatient Visits          2 months ago Lower abdominal pain   [place], [place]   9 months ago Neuropathy   [place], [place]   1 year ago Left hip pain   [place], [place], [place]   1 year ago Need for influenza vaccination   [place], RPH-CPP   1 year ago Hospital discharge follow-up   [place], MD      Future Appointments            In 3 days Ladell Pier, MD [place]

## 2022-02-01 ENCOUNTER — Other Ambulatory Visit (HOSPITAL_COMMUNITY): Payer: Self-pay

## 2022-02-01 ENCOUNTER — Other Ambulatory Visit: Payer: Self-pay | Admitting: Internal Medicine

## 2022-02-01 DIAGNOSIS — G629 Polyneuropathy, unspecified: Secondary | ICD-10-CM

## 2022-02-01 DIAGNOSIS — R0602 Shortness of breath: Secondary | ICD-10-CM

## 2022-02-01 MED ORDER — GABAPENTIN 300 MG PO CAPS
300.0000 mg | ORAL_CAPSULE | Freq: Every day | ORAL | 0 refills | Status: DC
  Filled 2022-02-01: qty 90, 90d supply, fill #0

## 2022-02-01 MED ORDER — GABAPENTIN 300 MG PO CAPS
300.0000 mg | ORAL_CAPSULE | Freq: Every day | ORAL | 1 refills | Status: DC
  Filled 2022-02-01: qty 30, 30d supply, fill #0

## 2022-02-02 ENCOUNTER — Telehealth: Payer: Self-pay

## 2022-02-02 ENCOUNTER — Other Ambulatory Visit: Payer: Self-pay

## 2022-02-02 ENCOUNTER — Encounter (HOSPITAL_COMMUNITY)
Admission: RE | Admit: 2022-02-02 | Discharge: 2022-02-02 | Disposition: A | Discharge status: Home / Self Care | Payer: Commercial Managed Care - HMO | Source: Ambulatory Visit | Attending: Gynecologic Oncology | Admitting: Gynecologic Oncology

## 2022-02-02 ENCOUNTER — Encounter (HOSPITAL_COMMUNITY): Payer: Self-pay

## 2022-02-02 VITALS — BP 142/92 | HR 86 | Temp 98.0°F | Resp 20 | Ht 63.0 in | Wt 245.0 lb

## 2022-02-02 DIAGNOSIS — Z01818 Encounter for other preprocedural examination: Secondary | ICD-10-CM | POA: Diagnosis present

## 2022-02-02 DIAGNOSIS — N9489 Other specified conditions associated with female genital organs and menstrual cycle: Secondary | ICD-10-CM | POA: Insufficient documentation

## 2022-02-02 DIAGNOSIS — I10 Essential (primary) hypertension: Secondary | ICD-10-CM | POA: Insufficient documentation

## 2022-02-02 HISTORY — DX: Unspecified osteoarthritis, unspecified site: M19.90

## 2022-02-02 HISTORY — DX: Anemia, unspecified: D64.9

## 2022-02-02 HISTORY — DX: Gastro-esophageal reflux disease without esophagitis: K21.9

## 2022-02-02 LAB — TYPE AND SCREEN
ABO/RH(D): A POS
Antibody Screen: NEGATIVE

## 2022-02-02 LAB — COMPREHENSIVE METABOLIC PANEL
ALT: 18 U/L (ref 0–44)
AST: 15 U/L (ref 15–41)
Albumin: 3.9 g/dL (ref 3.5–5.0)
Alkaline Phosphatase: 69 U/L (ref 38–126)
Anion gap: 6 (ref 5–15)
BUN: 17 mg/dL (ref 6–20)
CO2: 24 mmol/L (ref 22–32)
Calcium: 9.3 mg/dL (ref 8.9–10.3)
Chloride: 111 mmol/L (ref 98–111)
Creatinine, Ser: 1.17 mg/dL — ABNORMAL HIGH (ref 0.44–1.00)
GFR, Estimated: 59 mL/min — ABNORMAL LOW (ref >60)
Glucose, Bld: 99 mg/dL (ref 70–99)
Potassium: 4.4 mmol/L (ref 3.5–5.1)
Sodium: 141 mmol/L (ref 135–145)
Total Bilirubin: 0.5 mg/dL (ref 0.3–1.2)
Total Protein: 7.1 g/dL (ref 6.5–8.1)

## 2022-02-02 LAB — CBC
HCT: 42.5 % (ref 36.0–46.0)
Hemoglobin: 14.1 g/dL (ref 12.0–15.0)
MCH: 32.4 pg (ref 26.0–34.0)
MCHC: 33.2 g/dL (ref 30.0–36.0)
MCV: 97.7 fL (ref 80.0–100.0)
Platelets: 297 10*3/uL (ref 150–400)
RBC: 4.35 MIL/uL (ref 3.87–5.11)
RDW: 13.5 % (ref 11.5–15.5)
WBC: 6.6 10*3/uL (ref 4.0–10.5)
nRBC: 0 % (ref 0.0–0.2)

## 2022-02-02 NOTE — Telephone Encounter (Signed)
Spoke with Ms. Lisa Haley this afternoon and reviewed CMP results. Per NP Creatinine is elevated (1.17) patient needs to stay hydrated and she needs to avoid NSAIDs and mobic. Creatinine will be rechecked the morning of surgery. A copy of results will be sent to patients PCP Karle Plumber). Patient verbalized understanding, no questions voiced.

## 2022-02-03 ENCOUNTER — Other Ambulatory Visit: Payer: Self-pay

## 2022-02-03 ENCOUNTER — Ambulatory Visit: Payer: Commercial Managed Care - HMO | Attending: Internal Medicine | Admitting: Internal Medicine

## 2022-02-03 ENCOUNTER — Encounter: Payer: Self-pay | Admitting: Internal Medicine

## 2022-02-03 VITALS — BP 129/90 | HR 85 | Ht 62.5 in | Wt 250.2 lb

## 2022-02-03 DIAGNOSIS — N9489 Other specified conditions associated with female genital organs and menstrual cycle: Secondary | ICD-10-CM | POA: Diagnosis not present

## 2022-02-03 DIAGNOSIS — E278 Other specified disorders of adrenal gland: Secondary | ICD-10-CM

## 2022-02-03 DIAGNOSIS — R079 Chest pain, unspecified: Secondary | ICD-10-CM | POA: Diagnosis not present

## 2022-02-03 DIAGNOSIS — F17200 Nicotine dependence, unspecified, uncomplicated: Secondary | ICD-10-CM

## 2022-02-03 DIAGNOSIS — E279 Disorder of adrenal gland, unspecified: Secondary | ICD-10-CM

## 2022-02-03 DIAGNOSIS — I10 Essential (primary) hypertension: Secondary | ICD-10-CM | POA: Diagnosis not present

## 2022-02-03 DIAGNOSIS — Z01818 Encounter for other preprocedural examination: Secondary | ICD-10-CM | POA: Diagnosis not present

## 2022-02-03 MED ORDER — DEXAMETHASONE 1 MG PO TABS
ORAL_TABLET | ORAL | 0 refills | Status: DC
  Filled 2022-02-03: qty 1, 1d supply, fill #0

## 2022-02-03 MED ORDER — AMLODIPINE BESYLATE 10 MG PO TABS
10.0000 mg | ORAL_TABLET | Freq: Every day | ORAL | 1 refills | Status: DC
  Filled 2022-02-03: qty 30, 30d supply, fill #0
  Filled 2022-02-26 – 2022-03-07 (×2): qty 90, 90d supply, fill #1
  Filled 2022-06-02: qty 60, 60d supply, fill #2

## 2022-02-03 MED ORDER — TRAMADOL HCL 50 MG PO TABS
50.0000 mg | ORAL_TABLET | Freq: Three times a day (TID) | ORAL | 0 refills | Status: DC | PRN
  Filled 2022-02-03: qty 60, 20d supply, fill #0

## 2022-02-03 NOTE — Patient Instructions (Signed)
Please try to quit smoking prior to surgery. We will try to get you in with a cardiologist prior to your surgery for further evaluation of chest pains. Your blood pressure is not at goal.  We have increased the amlodipine to 10 mg daily. Please remember to come to the lab 1 day next week to have cortisol level drawn after you have taken the dexamethasone tablet the night before.

## 2022-02-03 NOTE — Progress Notes (Signed)
Patient ID: Breezy Hertenstein, female    DOB: [birth year]  MRN: [id number]  CC: pre-op eval Subjective: Jamyla Ard is a 46 y.o. female who presents for preoperative evaluation and chronic disease management Her concerns today include:  Patient with history of subarachnoid hemorrhage, tob dep, HTN  Patient presents today for preoperative evaluation.  She is scheduled to have unilateral salpingo oophorectomy due to left adnexal mass.  She has been evaluated by gynecology.  There is concern for possible gynecologic malignancy. -She has had partial hysterectomy 10 years ago and had no problems with anesthesia. -She has not been active over the past several months due to chronic left lower abdominal/pelvic pain.  Endorses shortness of breath and left-sided chest pain when she tries to walk so she avoids doing too much walking.  Her mailbox is about a 50 feet walk from the house.  Some days she does fine, other days she develops sharp pain in the left upper chest which goes away after 1 to 2 minutes "when I can calm down and catch my breath."  Pain does not radiate.  No associated cough or wheezing. -Smokes 3 to 4 cigarettes/day.  Trying to wean herself off.  Was smoking half a pack every 2 days. -No family history of heart disease. History of HTN, she is on Norvasc 5 mg and reports compliance with the medication and low-salt diet. -She is obese.  Does not ambulate much due to chronic pain in the left lower abdomen and left hip pain. She has tried to make changes in her diet including cutting out greasy and fried foods.  Drinks mainly water.  History of subarachnoid hemorrhage in 06/2020.  Gynecologist wants to know whether it would be okay for her to receive subcutaneous heparin postsurgery.  CAT scan that was done on the abdomen last month to evaluate her chronic abdominal pain showed incidental finding of left adrenal nodule measuring 1.9 x 1.3 cm.  Complains of chronic left lower  quadrant/left pelvic pain.  She was taking NSAIDs and Tylenol but they were not helping.  I recently prescribed some tramadol for her to use as needed until surgery.  She tells me she takes 1 with a Tylenol.  Patient Active Problem List   Diagnosis Date Noted   Ovarian mass 01/24/2022   BMI 45.0-49.9, adult ([place]) 01/24/2022   History of stroke 01/24/2022   Neuropathy 04/21/2021   Left hip pain 04/21/2021   SOB (shortness of breath) 04/21/2021   Migraine without aura and without status migrainosus, not intractable 08/06/2020   Essential hypertension 08/06/2020   Tobacco dependence 08/06/2020   Class 3 severe obesity due to excess calories with serious comorbidity and body mass index (BMI) of 45.0 to 49.9 in adult [facility]) 08/06/2020   SAH (subarachnoid hemorrhage) ([place]) 07/06/2020     Current Outpatient Medications on File Prior to Visit  Medication Sig Dispense Refill   amLODipine (NORVASC) 5 MG tablet TAKE 1 TABLET BY MOUTH DAILY. 90 tablet 1   acetaminophen (TYLENOL 8 HOUR) 650 MG CR tablet Take 1 tablet (650 mg total) by mouth every 8 (eight) hours as needed for pain. (Patient not taking: Reported on 01/25/2022) 60 tablet 1   acetaminophen (TYLENOL) 500 MG tablet Take 1,000 mg by mouth every 6 (six) hours as needed for moderate pain.     albuterol (VENTOLIN HFA) 108 (90 Base) MCG/ACT inhaler Inhale 2 puffs into the lungs every 6 (six) hours as needed for wheezing or shortness of breath. 8.5 g  0   cetirizine (ZYRTEC) 10 MG tablet Take 1 tablet (10 mg total) by mouth daily. (Patient taking differently: Take 10 mg by mouth daily as needed for allergies.) 30 tablet 11   cyclobenzaprine (FLEXERIL) 10 MG tablet Take 1/2 tablet (5 mg total) by mouth 3 (three) times daily as needed for muscle spasms. 40 tablet 0   gabapentin (NEURONTIN) 300 MG capsule Take 1 capsule (300 mg total) by mouth at bedtime. 90 capsule 0   meloxicam (MOBIC) 7.5 MG tablet Take 1 tablet by mouth daily as needed for pain 30  tablet 0   traMADol (ULTRAM) 50 MG tablet Take 1 tablet (50 mg total) by mouth every 12 (twelve) hours as needed for up to 7 days. 14 tablet 0   traMADol (ULTRAM) 50 MG tablet take 1 tablet by mouth every 12hrs as needed for up  to 7 days 14 tablet 0   No current facility-administered medications on file prior to visit.    No Known Allergies  Social History   Socioeconomic History   Marital status: Single    Spouse name: Not on file   Number of children: Not on file   Years of education: Not on file   Highest education level: Not on file  Occupational History   Not on file  Tobacco Use   Smoking status: Every Day    Packs/day: 0.25    Types: Cigarettes   Smokeless tobacco: Never  Vaping Use   Vaping Use: Never used  Substance and Sexual Activity   Alcohol use: Yes    Comment: once a week   Drug use: Yes    Types: Marijuana    Comment: 3-4x week   Sexual activity: Yes    Birth control/protection: Surgical  Other Topics Concern   Not on file  Social History Narrative   Not on file   Social Determinants of Health   Financial Resource Strain: Not on file  Food Insecurity: Not on file  Transportation Needs: Not on file  Physical Activity: Not on file  Stress: Not on file  Social Connections: Not on file  Intimate Partner Violence: Not on file    Family History  Problem Relation Age of Onset   Ovarian cancer Neg Hx    Uterine cancer Neg Hx    Breast cancer Neg Hx     Past Surgical History:  Procedure Laterality Date   ABDOMINAL HYSTERECTOMY     in 80s for heavy bleeding, RSO   IR ANGIO INTRA EXTRACRAN SEL INTERNAL CAROTID BILAT MOD SED  07/06/2020   IR ANGIO INTRA EXTRACRAN SEL INTERNAL CAROTID BILAT MOD SED  07/12/2020   IR ANGIO VERTEBRAL SEL VERTEBRAL BILAT MOD SED  07/06/2020   IR ANGIO VERTEBRAL SEL VERTEBRAL BILAT MOD SED  07/12/2020   RADIOLOGY WITH ANESTHESIA N/A 07/06/2020   Procedure: IR WITH ANESTHESIA;  Surgeon: Consuella Lose, MD;   Location: [place];  Service: Radiology;  Laterality: N/A;   TUBAL LIGATION     WRIST SURGERY Left    for carpal tunnel    ROS: Review of Systems Negative except as stated above  PHYSICAL EXAM: BP 129/90   Pulse 85   Ht 5' 2.5" (1.588 m)   Wt 250 lb 3.2 oz (113.5 kg)   SpO2 95%   BMI 45.03 kg/m   Physical Exam Repeat blood pressure 131/89 General appearance - alert, well appearing, morbidly obese middle-age African-American female and in no distress Mental status - normal mood, behavior, speech, dress,  motor activity, and thought processes Eyes - pupils equal and reactive, extraocular eye movements intact Mouth - mucous membranes moist, pharynx normal without lesions Neck - supple, no significant adenopathy Chest - clear to auscultation, no wheezes, rales or rhonchi, symmetric air entry Heart - normal rate, regular rhythm, normal S1, S2, no murmurs, rubs, clicks or gallops Extremities -no lower extremity edema      Latest Ref Rng & Units 02/02/2022   10:16 AM 11/09/2021    3:11 PM 04/20/2021    3:32 PM  CMP  Glucose 70 - 99 mg/dL 99  85  88   BUN 6 - 20 mg/dL '17  16  11   '$ Creatinine 0.44 - 1.00 mg/dL 1.17  0.87  1.00   Sodium 135 - 145 mmol/L 141  141  140   Potassium 3.5 - 5.1 mmol/L 4.4  4.9  4.5   Chloride 98 - 111 mmol/L 111  109  104   CO2 22 - 32 mmol/L 24  23    Calcium 8.9 - 10.3 mg/dL 9.3  8.9  9.7   Total Protein 6.5 - 8.1 g/dL 7.1  6.2  6.8   Total Bilirubin 0.3 - 1.2 mg/dL 0.5  0.3  0.4   Alkaline Phos 38 - 126 U/L 69  68  80   AST 15 - 41 U/L '15  19  16   '$ ALT 0 - 44 U/L 18  20     Lipid Panel     Component Value Date/Time   TRIG 80 07/11/2020 0030    CBC    Component Value Date/Time   WBC 6.6 02/02/2022 1016   RBC 4.35 02/02/2022 1016   HGB 14.1 02/02/2022 1016   HGB 13.5 11/09/2021 1511   HCT 42.5 02/02/2022 1016   HCT 38.5 11/09/2021 1511   PLT 297 02/02/2022 1016   PLT 275 11/09/2021 1511   MCV 97.7 02/02/2022 1016   MCV 94 11/09/2021 1511    MCH 32.4 02/02/2022 1016   MCHC 33.2 02/02/2022 1016   RDW 13.5 02/02/2022 1016   RDW 12.9 11/09/2021 1511   LYMPHSABS 2.1 11/09/2021 1511   MONOABS 0.7 12/07/2020 1141   EOSABS 0.5 (H) 11/09/2021 1511   BASOSABS 0.1 11/09/2021 1511    ASSESSMENT AND PLAN: 1. Pre-operative clearance -Recent labs and EKG studies were reviewed. I would like to get her in with cardiology prior to surgery given her report of CP/DOE on exertion.  She is not able to walk enough to make 4 METS. Blood pressure is not at goal.  I recommend increasing the amlodipine to 10 mg daily. -I think it would be okay to use subcutaneous heparin perioperatively.  However I will send a message to Dr.Nundkumar to verify. 2. Adnexal mass I have increased the frequency of the tramadol to every 8 hours as needed.  Advised okay to take with Tylenol. - traMADol (ULTRAM) 50 MG tablet; Take 1 tablet (50 mg total) by mouth every 8 (eight) hours as needed.  Dispense: 60 tablet; Refill: 0  3. Chest pain in adult See #1 above - Ambulatory referral to Cardiology  4. Essential hypertension See plan as stated under #1. - amLODipine (NORVASC) 10 MG tablet; Take 1 tablet (10 mg total) by mouth daily.  Dispense: 90 tablet; Refill: 1  5. Tobacco dependence Strongly advised to quit.  She is currently weaning herself off.  Ideally, I would have liked to have seen her stop at least 4 weeks prior to surgery.  6. Morbid obesity ([place]) Commended her on changes that she is made in her eating habits so far.  Healthy eating habits discussed and encouraged.  7. Adrenal nodule ([place]) Went over this diagnosis with her.  This is considered an incidentaloma.  We need to screen for over secretion including doing the dexamethasone suppression test.  We will draw the other hormone levels today.  She will come back next week to have an early morning cortisol level drawn after taking 1 mg of dexamethasone the night before. Further management will be based  on results. - DHEA-sulfate - Metanephrines, plasma - Aldosterone - dexamethasone (DECADRON) 1 MG tablet; Take 1 tab at 11 p.m night before coming to Cortisol level draw the following morning.  Dispense: 1 tablet; Refill: 0    Patient was given the opportunity to ask questions.  Patient verbalized understanding of the plan and was able to repeat key elements of the plan.   This documentation was completed using Radio producer.  Any transcriptional errors are unintentional.  No orders of the defined types were placed in this encounter.    Requested Prescriptions    No prescriptions requested or ordered in this encounter    No follow-ups on file.  Karle Plumber, MD, FACP

## 2022-02-05 NOTE — Progress Notes (Unsigned)
Cardiology Office Note:    Date:  02/06/2022   ID:  Rhealynn Myhre, DOB 14-Sep-1975, MRN [id number]  PCP:  Ladell Pier, MD  Cardiologist:  None   Referring MD: Ladell Pier, MD   Chief Complaint  Patient presents with   Chest Pain   Advice Only    Tobacco use Hypertension Obesity     History of Present Illness:    Lisa Haley is a 46 y.o. female with a hx of chest discomfort, longstanding over greater than 5 years.  Patient has history of smoking, obesity, GERD, prior ruptured subarachnoid cerebral aneurysm with CVA, hypertension, and obesity.   She gives a 3 to 5-year history of variable threshold chest discomfort that she describes as aching.  Walking as little as 50 feet can occasionally bring it on.  The discomfort can last up to 5 minutes before it resolves.  The discomfort never occurs at rest.  She denies orthopnea, prolonged tachypalpitations, lower extremity edema, orthopnea, and PND.  She has been smoking for greater than 20 years.  States that she is down to 5 cigarettes/day.  Smoking is never associated with chest discomfort.  History of hypertension.  Currently takes amlodipine.  Will be having laparoscopic left salpingo oophorectomy robotically on 02/14/2022, Dr.Katherine R. Berline Lopes.  Past Medical History:  Diagnosis Date   Anemia    after hysterectomy   Arthritis    GERD (gastroesophageal reflux disease)    Hypertension    Obesity    Stroke ([place]) 2021   SAH, admitted at [facility] 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    Past Surgical History:  Procedure Laterality Date   ABDOMINAL HYSTERECTOMY     in 61s for heavy bleeding, RSO   IR ANGIO INTRA EXTRACRAN SEL INTERNAL CAROTID BILAT MOD SED  07/06/2020   IR ANGIO INTRA EXTRACRAN SEL INTERNAL CAROTID BILAT MOD SED  07/12/2020   IR ANGIO VERTEBRAL SEL VERTEBRAL BILAT MOD SED  07/06/2020   IR ANGIO VERTEBRAL SEL VERTEBRAL BILAT MOD SED  07/12/2020   RADIOLOGY WITH ANESTHESIA N/A 07/06/2020    Procedure: IR WITH ANESTHESIA;  Surgeon: Consuella Lose, MD;  Location: [place];  Service: Radiology;  Laterality: N/A;   TUBAL LIGATION     WRIST SURGERY Left    for carpal tunnel    Current Medications: Current Meds  Medication Sig   albuterol (VENTOLIN HFA) 108 (90 Base) MCG/ACT inhaler Inhale 2 puffs into the lungs every 6 (six) hours as needed for wheezing or shortness of breath.   amLODipine (NORVASC) 10 MG tablet Take 1 tablet (10 mg total) by mouth daily.   cetirizine (ZYRTEC) 10 MG tablet Take 1 tablet (10 mg total) by mouth daily. (Patient taking differently: Take 10 mg by mouth daily as needed for allergies.)   cyclobenzaprine (FLEXERIL) 10 MG tablet Take 1/2 tablet (5 mg total) by mouth 3 (three) times daily as needed for muscle spasms.   dexamethasone (DECADRON) 1 MG tablet Take 1 tab at 11 p.m night before coming to Cortisol level draw the following morning.   gabapentin (NEURONTIN) 300 MG capsule Take 1 capsule (300 mg total) by mouth at bedtime.   meloxicam (MOBIC) 7.5 MG tablet Take 1 tablet by mouth daily as needed for pain   metoprolol tartrate (LOPRESSOR) 100 MG tablet Take 1 tablet (100 mg total) by mouth once for 1 dose. Take 90-120 minutes prior to scan.   traMADol (ULTRAM) 50 MG tablet take 1 tablet by mouth every 12hrs as needed for up  to  7 days     Allergies:   Patient has no known allergies.   Social History   Socioeconomic History   Marital status: Single    Spouse name: Not on file   Number of children: Not on file   Years of education: Not on file   Highest education level: Not on file  Occupational History   Not on file  Tobacco Use   Smoking status: Every Day    Packs/day: 0.25    Types: Cigarettes   Smokeless tobacco: Never  Vaping Use   Vaping Use: Never used  Substance and Sexual Activity   Alcohol use: Yes    Comment: once a week   Drug use: Yes    Types: Marijuana    Comment: 3-4x week   Sexual activity: Yes    Birth  control/protection: Surgical  Other Topics Concern   Not on file  Social History Narrative   Not on file   Social Determinants of Health   Financial Resource Strain: Not on file  Food Insecurity: Not on file  Transportation Needs: Unmet Transportation Needs (02/03/2022)   PRAPARE - Hydrologist (Medical): Yes    Lack of Transportation (Non-Medical): Yes  Physical Activity: Not on file  Stress: Not on file  Social Connections: Not on file     Family History: The patient's family history is negative for Ovarian cancer, Uterine cancer, and Breast cancer.  ROS:   Please see the history of present illness.    No family history of heart disease.  No prior history of sudden death.  All other systems reviewed and are negative.  EKGs/Labs/Other Studies Reviewed:    The following studies were reviewed today: No prior cardiac evaluation.  EKG: Performed on 02/02/2022 revealing normal sinus rhythm with poor R wave progression V1 through V5 and left axis deviation.  Somewhat similar to prior tracing performed July 06, 2020.  R wave progression on the 2021 EKG was not as widespread.  Recent Labs: 02/02/2022: ALT 18; BUN 17; Creatinine, Ser 1.17; Hemoglobin 14.1; Platelets 297; Potassium 4.4; Sodium 141  Recent Lipid Panel    Component Value Date/Time   TRIG 80 07/11/2020 0030    Physical Exam:    VS:  BP 118/88   Pulse 87   Ht 5' 2.5" (1.588 m)   Wt 252 lb 6.4 oz (114.5 kg)   SpO2 100%   BMI 45.43 kg/m     Wt Readings from Last 3 Encounters:  02/06/22 252 lb 6.4 oz (114.5 kg)  02/03/22 250 lb 3.2 oz (113.5 kg)  02/02/22 245 lb (111.1 kg)     GEN: Morbid with BMI 45. No acute distress HEENT: Normal NECK: No JVD. LYMPHATICS: No lymphadenopathy CARDIAC: No murmur. RRR S4 but no S3 gallop, or edema. VASCULAR:  Normal Pulses. No bruits. RESPIRATORY:  Clear to auscultation without rales, wheezing or rhonchi  ABDOMEN: Soft, non-tender,  non-distended, No pulsatile mass, MUSCULOSKELETAL: No deformity  SKIN: Warm and dry NEUROLOGIC:  Alert and oriented x 3 PSYCHIATRIC:  Normal affect   ASSESSMENT:    1. Chest pain, unspecified type   2. SOB (shortness of breath)   3. Tobacco dependence   4. Nonspecific abnormal electrocardiogram (ECG) (EKG)   5. Essential hypertension   6. Class 3 severe obesity due to excess calories with serious comorbidity and body mass index (BMI) of 45.0 to 49.9 in adult ([place])   7. SAH (subarachnoid hemorrhage) ([place])   8. Pre-procedure lab  exam    PLAN:    In order of problems listed above:  This complaint has been present for greater than 3 years.  It has been quiescent over the last several months because the patient does not walk as much due to left lower quadrant discomfort.  Associated with the exertion related discomfort or shortness of breath.  She smokes cigarettes and is obese.  We will plan to perform a coronary CTA with FFR if indicated.  This will help identify if there is underlying coronary disease in a stable pattern. She does experience shortness of breath, possibly related to deconditioning and obesity versus other.  2D Doppler echocardiogram will be done to assess LV size and function. I encouraged her to discontinue cigarette smoking. EKG suggest the possibility of prior anterior infarct.  This could be a pseudo infarct pattern related to left axis deviation also noted on the EKG. Blood pressures well controlled today on Norvasc 10 mg/day. Noted Remote  Further evaluation will be dependent upon findings.  Will notify Dr. Berline Lopes that the patient has seen cardiology.  The outpatient robotic surgery is planned to occur in 9 days.  There is no real reason to delay surgery given the stability of her current symptoms over years.  The patient was referred by primary physician Karle Plumber, MD.   Medication Adjustments/Labs and Tests Ordered: Current medicines are reviewed at length  with the patient today.  Concerns regarding medicines are outlined above.  Orders Placed This Encounter  Procedures   CT CORONARY MORPH W/CTA COR W/SCORE W/CA W/CM &/OR WO/CM   Basic metabolic panel   ECHOCARDIOGRAM COMPLETE   Meds ordered this encounter  Medications   metoprolol tartrate (LOPRESSOR) 100 MG tablet    Sig: Take 1 tablet (100 mg total) by mouth once for 1 dose. Take 90-120 minutes prior to scan.    Dispense:  1 tablet    Refill:  0    Patient Instructions  Medication Instructions:  Your physician recommends that you continue on your current medications as directed. Please refer to the Current Medication list given to you today.  *If you need a refill on your cardiac medications before your next appointment, please call your pharmacy*  Lab Work: TODAY: BMET If you have labs (blood work) drawn today and your tests are completely normal, you will receive your results only by: [place] (if you have MyChart) OR A paper copy in the mail If you have any lab test that is abnormal or we need to change your treatment, we will call you to review the results.  Testing/Procedures: Your physician has recommended you have a coronary CTA performed.  Your physician has requested that you have an echocardiogram. Echocardiography is a painless test that uses sound waves to create images of your heart. It provides your doctor with information about the size and shape of your heart and how well your heart's chambers and valves are working. This procedure takes approximately one hour. There are no restrictions for this procedure.  Follow-Up: At [facility], you and your health needs are our priority.  As part of our continuing mission to provide you with exceptional heart care, we have created designated Provider Care Teams.  These Care Teams include your primary Cardiologist (physician) and Advanced Practice Providers (APPs -  Physician Assistants and Nurse Practitioners) who  all work together to provide you with the care you need, when you need it.  Your next appointment:   Will be determined based on  results of coronary CTA and echocardiogram.   Other Instructions   Your cardiac CT will be scheduled at the below location:   [facility] [address] [place], [place] [address] [phone]  Please arrive at the [facility]En[facility]ance C2[facility] 30 minutes prior to test start time. You can use the FREE valet parking offered at entrance C (encouraged to control the heart rate for the test)  Proceed to the [facility] Radiology Department (first floor) to check-in and test prep.  All radiology patients and guests should use entrance C2 at [facility], accessed from [facility], even though the hospital's physical address listed is [address].    Please follow these instructions carefully (unless otherwise directed):  On the Night Before the Test: Be sure to Drink plenty of water. Do not consume any caffeinated/decaffeinated beverages or chocolate 12 hours prior to your test. Do not take any antihistamines 12 hours prior to your test.  On the Day of the Test: Drink plenty of water until 1 hour prior to the test. Do not eat any food 4 hours prior to the test. You may take your regular medications prior to the test.  Take metoprolol (Lopressor) '100mg'$  two hours prior to test.  FEMALES- please wear underwire-free bra if available, avoid dresses & tight clothing      After the Test: Drink plenty of water. After receiving IV contrast, you may experience a mild flushed feeling. This is normal. On occasion, you may experience a mild rash up to 24 hours after the test. This is not dangerous. If this occurs, you can take Benadryl 25 mg and increase your fluid intake. If you experience trouble breathing, this can be serious. If it is severe call 911 IMMEDIATELY. If it is mild,  please call our office. If you take any of these medications: Glipizide/Metformin, Avandament, Glucavance, please do not take 48 hours after completing test unless otherwise instructed.  We will call to schedule your test 2-4 weeks out understanding that some insurance companies will need an authorization prior to the service being performed.   For non-scheduling related questions, please contact the cardiac imaging nurse navigator should you have any questions/concerns: Marchia Bond, Cardiac Imaging Nurse Navigator Gordy Clement, Cardiac Imaging Nurse Navigator [facility] Direct Office Dial: [phone]   For scheduling needs, including cancellations and rescheduling, please call [place], [phone].   Important Information About Sugar         Signed, Sinclair Grooms, MD  02/06/2022 1:15 PM    [facility]

## 2022-02-06 ENCOUNTER — Ambulatory Visit (INDEPENDENT_AMBULATORY_CARE_PROVIDER_SITE_OTHER): Payer: Commercial Managed Care - HMO | Admitting: Interventional Cardiology

## 2022-02-06 ENCOUNTER — Other Ambulatory Visit: Payer: Self-pay

## 2022-02-06 ENCOUNTER — Encounter: Payer: Self-pay | Admitting: Interventional Cardiology

## 2022-02-06 VITALS — BP 118/88 | HR 87 | Ht 62.5 in | Wt 252.4 lb

## 2022-02-06 DIAGNOSIS — R9431 Abnormal electrocardiogram [ECG] [EKG]: Secondary | ICD-10-CM | POA: Diagnosis not present

## 2022-02-06 DIAGNOSIS — R079 Chest pain, unspecified: Secondary | ICD-10-CM | POA: Diagnosis not present

## 2022-02-06 DIAGNOSIS — Z01812 Encounter for preprocedural laboratory examination: Secondary | ICD-10-CM

## 2022-02-06 DIAGNOSIS — R0602 Shortness of breath: Secondary | ICD-10-CM | POA: Diagnosis not present

## 2022-02-06 DIAGNOSIS — F17200 Nicotine dependence, unspecified, uncomplicated: Secondary | ICD-10-CM

## 2022-02-06 DIAGNOSIS — I10 Essential (primary) hypertension: Secondary | ICD-10-CM

## 2022-02-06 DIAGNOSIS — Z6841 Body Mass Index (BMI) 40.0 and over, adult: Secondary | ICD-10-CM

## 2022-02-06 DIAGNOSIS — I609 Nontraumatic subarachnoid hemorrhage, unspecified: Secondary | ICD-10-CM

## 2022-02-06 LAB — BASIC METABOLIC PANEL
BUN/Creatinine Ratio: 13 (ref 9–23)
BUN: 13 mg/dL (ref 6–24)
CO2: 24 mmol/L (ref 20–29)
Calcium: 9.1 mg/dL (ref 8.7–10.2)
Chloride: 106 mmol/L (ref 96–106)
Creatinine, Ser: 1 mg/dL (ref 0.57–1.00)
Glucose: 82 mg/dL (ref 70–99)
Potassium: 4.3 mmol/L (ref 3.5–5.2)
Sodium: 138 mmol/L (ref 134–144)
eGFR: 71 mL/min/{1.73_m2} (ref >59)

## 2022-02-06 MED ORDER — METOPROLOL TARTRATE 100 MG PO TABS
100.0000 mg | ORAL_TABLET | Freq: Once | ORAL | 0 refills | Status: DC
  Filled 2022-02-06: qty 1, 1d supply, fill #0

## 2022-02-06 NOTE — Patient Instructions (Signed)
Medication Instructions:  Your physician recommends that you continue on your current medications as directed. Please refer to the Current Medication list given to you today.  *If you need a refill on your cardiac medications before your next appointment, please call your pharmacy*  Lab Work: TODAY: BMET If you have labs (blood work) drawn today and your tests are completely normal, you will receive your results only by: [place] (if you have MyChart) OR A paper copy in the mail If you have any lab test that is abnormal or we need to change your treatment, we will call you to review the results.  Testing/Procedures: Your physician has recommended you have a coronary CTA performed.  Your physician has requested that you have an echocardiogram. Echocardiography is a painless test that uses sound waves to create images of your heart. It provides your doctor with information about the size and shape of your heart and how well your heart's chambers and valves are working. This procedure takes approximately one hour. There are no restrictions for this procedure.  Follow-Up: At [facility], you and your health needs are our priority.  As part of our continuing mission to provide you with exceptional heart care, we have created designated Provider Care Teams.  These Care Teams include your primary Cardiologist (physician) and Advanced Practice Providers (APPs -  Physician Assistants and Nurse Practitioners) who all work together to provide you with the care you need, when you need it.  Your next appointment:   Will be determined based on results of coronary CTA and echocardiogram.   Other Instructions   Your cardiac CT will be scheduled at the below location:   [facility] [address] [place], [place] [address] [phone]  Please arrive at the [facility] Entrance (Entrance C2) of [facility] 30 minutes prior to test start time. You can use  the FREE valet parking offered at entrance C (encouraged to control the heart rate for the test)  Proceed to the [facility] Radiology Department (first floor) to check-in and test prep.  All radiology patients and guests should use entrance C2 at [facility], accessed from [facility], even though the hospital's physical address listed is [address]    Please follow these instructions carefully (unless otherwise directed):  On the Night Before the Test: Be sure to Drink plenty of water. Do not consume any caffeinated/decaffeinated beverages or chocolate 12 hours prior to your test. Do not take any antihistamines 12 hours prior to your test.  On the Day of the Test: Drink plenty of water until 1 hour prior to the test. Do not eat any food 4 hours prior to the test. You may take your regular medications prior to the test.  Take metoprolol (Lopressor) '100mg'$  two hours prior to test.  FEMALES- please wear underwire-free bra if available, avoid dresses & tight clothing      After the Test: Drink plenty of water. After receiving IV contrast, you may experience a mild flushed feeling. This is normal. On occasion, you may experience a mild rash up to 24 hours after the test. This is not dangerous. If this occurs, you can take Benadryl 25 mg and increase your fluid intake. If you experience trouble breathing, this can be serious. If it is severe call 911 IMMEDIATELY. If it is mild, please call our office. If you take any of these medications: Glipizide/Metformin, Avandament, Glucavance, please do not take 48 hours after  completing test unless otherwise instructed.  We will call to schedule your test 2-4 weeks out understanding that some insurance companies will need an authorization prior to the service being performed.   For non-scheduling related questions, please contact the cardiac imaging nurse navigator should you have any questions/concerns: Marchia Bond,  Cardiac Imaging Nurse Navigator Gordy Clement, Cardiac Imaging Nurse Navigator [facility] Direct Office Dial: [phone]   For scheduling needs, including cancellations and rescheduling, please call [place], [phone].   Important Information About [place]

## 2022-02-07 ENCOUNTER — Inpatient Hospital Stay: Payer: Commercial Managed Care - HMO | Attending: Gynecologic Oncology | Admitting: Licensed Clinical Social Worker

## 2022-02-07 ENCOUNTER — Telehealth: Payer: Self-pay | Admitting: Emergency Medicine

## 2022-02-07 NOTE — Progress Notes (Signed)
[place] Work  Initial Assessment   Lisa Haley is a 46 y.o. year old female who contacted CSW by phone. Clinical Social Work was referred by self for assessment of psychosocial needs.   SDOH (Social Determinants of Health) assessments performed: Yes SDOH Interventions    Flowsheet Row Most Recent Value  SDOH Interventions   Financial Strain Interventions Other (Comment)  [DSS, [employer], [place] Interventions Other (Comment)  [local resources]       SDOH Screenings   Alcohol Screen: Not on file  Depression (PHQ2-9): Medium Risk (02/03/2022)   Depression (PHQ2-9)    PHQ-2 Score: 5  Financial Resource Strain: High Risk (02/07/2022)   Overall Financial Resource Strain (CARDIA)    Difficulty of Paying Living Expenses: Very hard  Food Insecurity: Not on file  Housing: Medium Risk (02/07/2022)   Housing    Last Housing Risk Score: 1  Physical Activity: Not on file  Social Connections: Not on file  Stress: Not on file  Tobacco Use: High Risk (02/06/2022)   Patient History    Smoking Tobacco Use: Every Day    Smokeless Tobacco Use: Never    Passive Exposure: Not on file  Transportation Needs: Unmet Transportation Needs (02/03/2022)   PRAPARE - Transportation    Lack of Transportation (Medical): Yes    Lack of Transportation (Non-Medical): Yes     Distress Screen completed: No     No data to display            Family/Social Information:  Housing Arrangement: patient lives alone. Has Section 8 housing. Adult son & daughter live in [place] but work long hours Family members/support persons in your life? Family Transportation concerns: no  Employment: Out of work due to pain for about 2 months.  Income source: No income Financial concerns: Yes, current concerns Type of concern: Utilities Religious or spiritual practice: Not part of a religious community locally Services Currently in place:  Section 8 housing, signed up for [employer]  Coping/  Adjustment to diagnosis: Patient understands treatment plan and what happens next? yes, patient has diagnostic surgery coming up Patient reported stressors:  Utility bills- water shut off this morning.  Pt has already been approved for the LIWAP through [place] and has a letter stating they will be paying the bill. The [place] did not receive it and, when she was on the phone with them, her water was shut off. She has already reached out to other crisis programs Naval architect, Soil scientist, churches) Current coping skills/ strengths: Other: able to access community resources    SUMMARY: Current SDOH Barriers:  Water has been shut off  Clinical Social Work Clinical Goal(s):  Patient will have working utilities  Interventions: Provided CSW contact information and encouraged patient to call with any questions or concerns CSW provided state-level contact number for LIWAP as pt did not receive response from local DSS on when the check was sent Discussed any social supports (her children) and potentially utilizing them until water is returned    Follow Up Plan: Patient will contact state-level LIWAP program Patient verbalizes understanding of plan: Yes    Lisa Shives E Yishai Rehfeld, LCSW

## 2022-02-07 NOTE — Telephone Encounter (Signed)
Copied from [place]. Topic: General - Other >> Feb 07, 2022  2:12 PM Everette C wrote: Reason for CRM: The patient has returned a missed call from the practice There were no new notes or encounters at the time of returned call Please contact further.  The patient has recently had their water disconnected and is interested in receiving any type of assistance available   Please contact further    Contacted patient about initial call about getting transportation for future appointments, also informed patient that Opal Sidles is out of the office.

## 2022-02-08 ENCOUNTER — Telehealth: Payer: Self-pay

## 2022-02-08 ENCOUNTER — Telehealth: Payer: Self-pay | Admitting: Internal Medicine

## 2022-02-08 NOTE — Progress Notes (Signed)
Anesthesia Chart Review   Case: [id number] Date/Time: 02/14/22 0715   Procedure: XI ROBOTIC ASSISTED UNILATERAL SALPINGO OOPHORECTOMY,POSSIBLE STAGING, POSSIBLE LAPAROTOMY   Anesthesia type: General   Pre-op diagnosis: OVARIAN MASS   Location: [facility] ROOM 05 / [facility]   Surgeons: Lafonda Mosses, MD       DISCUSSION:46 y.o. smoker with h/o HTN, GERD, Stroke, [facility] 06/2020, ovarian mass scheduled for above procedure 02/14/2022 with Dr. Jeral Pinch.   Pt seen by cardiology 02/06/2022. CTA w/FFR and Echo ordered at this visit.  Per Dr. Mallie Mussel, no need to delay upcoming procedure given stability of current sx.   Anticipate pt can proceed with planned procedure barring acute status change.   VS: BP (!) 142/92 Comment: right arm sitting  Pulse 86   Temp 36.7 C   Resp 20   Ht '5\' 3"'$  (1.6 m)   Wt 111.1 kg   SpO2 100%   BMI 43.40 kg/m   PROVIDERS: Ladell Pier, MD is PCP   Daneen Schick, MD is Cardiologist  LABS: Labs reviewed: Acceptable for surgery. (all labs ordered are listed, but only abnormal results are displayed)  Labs Reviewed  COMPREHENSIVE METABOLIC PANEL - Abnormal; Notable for the following components:      Result Value   Creatinine, Ser 1.17 (*)    GFR, Estimated 59 (*)    All other components within normal limits  CBC  TYPE AND SCREEN     IMAGES:   EKG: 02/02/2022 Rate 80 bpm  Normal sinus rhythm Minimal voltage criteria for LVH, may be normal variant ( R in aVL ) Anterior infarct , age undetermined Abnormal ECG When compared with ECG of 06-Jul-2020 03:02, PREVIOUS ECG IS PRESENT No significant change since last tracing  CV:  Past Medical History:  Diagnosis Date   Anemia    after hysterectomy   Arthritis    GERD (gastroesophageal reflux disease)    Hypertension    Obesity    Stroke ([place]) 2021   SAH, 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    Past Surgical History:  Procedure Laterality Date   ABDOMINAL HYSTERECTOMY     in 17s for  heavy bleeding, RSO   IR ANGIO INTRA EXTRACRAN SEL INTERNAL CAROTID BILAT MOD SED  07/06/2020   IR ANGIO INTRA EXTRACRAN SEL INTERNAL CAROTID BILAT MOD SED  07/12/2020   IR ANGIO VERTEBRAL SEL VERTEBRAL BILAT MOD SED  07/06/2020   IR ANGIO VERTEBRAL SEL VERTEBRAL BILAT MOD SED  07/12/2020   RADIOLOGY WITH ANESTHESIA N/A 07/06/2020   Procedure: IR WITH ANESTHESIA;  Surgeon: Consuella Lose, MD;  Location: [place];  Service: Radiology;  Laterality: N/A;   TUBAL LIGATION     WRIST SURGERY Left    for carpal tunnel    MEDICATIONS:  albuterol (VENTOLIN HFA) 108 (90 Base) MCG/ACT inhaler   amLODipine (NORVASC) 10 MG tablet   cetirizine (ZYRTEC) 10 MG tablet   cyclobenzaprine (FLEXERIL) 10 MG tablet   dexamethasone (DECADRON) 1 MG tablet   gabapentin (NEURONTIN) 300 MG capsule   meloxicam (MOBIC) 7.5 MG tablet   metoprolol tartrate (LOPRESSOR) 100 MG tablet   traMADol (ULTRAM) 50 MG tablet   No current facility-administered medications for this encounter.    Konrad Felix Ward, PA-C [facility] Pre-Surgical Testing [phone]

## 2022-02-08 NOTE — Telephone Encounter (Signed)
-----   Message from Consuella Lose, MD sent at 02/03/2022  2:04 PM EDT ----- Yes, that's fine.  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  She is scheduled to undergo surgery for left adnexal mass/ovarian mass by gynecology.  They wanted to know whether it was okay for them to use subcutaneous heparin in the perioperative period.  I think that should be okay but just wanted to double check with you.

## 2022-02-08 NOTE — Telephone Encounter (Signed)
Call returned to patient.  She explained that her water was cut off yesterday, she owes $169 and is not able to pay that bill. She said she has contacted numerous local and state programs for assistance including, DSS, [employer], and cannot remember the names of all of the places she has called; but none have offered any assistance.  She said DSS will only help with electricity, not water.  She said she was approved for LIWAP but is not sure when she will receive that money.  She said she was told it may not be until the end of the year.   She said she was also told that she can't set up a payment plan for the water bill because the water has already been turned off. She could have set up a payment plan if it was prior to being turned off.   She had discussed these resource options yesterday with Garnet Koyanagi, LCSW.   She was in agreement to submitting a referral to Legal [facility] [place] for possible assistance with working with the [place].  I explained that a referral can be submitted but there is no guarantee that they will be able to help and she said she understood.   SCAT application was receive but the patient had not completed part A or signed signed part A or B. I completed the application with her an told her that I would fax to [place] for her

## 2022-02-09 ENCOUNTER — Telehealth: Payer: Self-pay

## 2022-02-09 LAB — DHEA-SULFATE: DHEA-SO4: 33.5 ug/dL — ABNORMAL LOW (ref 41.2–243.7)

## 2022-02-09 LAB — ALDOSTERONE: ALDOSTERONE: 2.7 ng/dL (ref 0.0–30.0)

## 2022-02-09 LAB — METANEPHRINES, PLASMA
Metanephrine, Free: 22.3 pg/mL (ref 0.0–88.0)
Normetanephrine, Free: 226.9 pg/mL — ABNORMAL HIGH (ref 0.0–218.9)

## 2022-02-09 NOTE — Telephone Encounter (Signed)
Completed SCAT application faxed to [employer] [place] [employer].

## 2022-02-09 NOTE — Telephone Encounter (Signed)
Lisa Haley MLP REFERRAL FORM  TO: Medical-Legal Partnership (MLP) Program, [facility] [place]: [phone] (no cover sheet needed)  FROM: INFORMATION ABOUT Lisa Haley We need the following information to be able to confirm that we received this referral. If we are unable to contact the referred patient/caregiver, we may contact you to request additional information.   Name: Lisa Lathe, RN Department: [place] [id number] [phone]    Direct Phone #: [phone] Email: [email or web address]  Date: 02/09/22   Reason(s) for Referral: Potential Legal Needs That May Be Addressed by [place] THAT MAY HAVE A LEGAL REMEDY  Food Insecurity, Income Security, Access to [employer] (1) '[]'$  Food Stamps/Nutrition Assistance '[]'$  Social Security Disability Benefits (SSI or SSDI) '[]'$  Medicaid '[]'$  Help with enrollment in [employer] insurance (2) Housing Security/Safety of Housing Conditions  '[]'$  Eviction  '[]'$  Loss of housing subsidy for public housing '[]'$  Mortgage foreclosure '[]'$  Housing repairs/unsafe conditions in rental housing '[]'$  Housing discrimination  Personal/ Family Safety and Individual Rights Other:  '[]'$  Domestic Violence Protective Order '[x]'$  Please describe (3): The patient reports that her water has been shut off and she is not able to pay her bill: $169.00.  She explained that she was approved for [facility] but her bill as not been paid.  She said she was told that she may not receive money for this payment until the end of the year. Is there any way to expedite this?     (1)  With the exception of helping with enrollment in [employer], we do not assist with initial applications for public benefits. We may be able to assist those whose  applications are denied or whose benefits are terminated or reduced.  (2) Federally-certified Hydrologist can help with applications for [employer].  (3) We recommend contacting Legal Aid of [place] to request a de-identified consult prior to making a referral for topics not included here.     Deadlines/Emergencies Is there a hearing or appeal deadline within the next 10 days? '[x]'$  No  '[]'$  Not sure '[]'$  Yes. When?   Has the patient/caregiver received court papers about this issue?  '[x]'$  No  '[]'$  Not sure '[]'$  Yes. When?   Is there another legal emergency? '[x]'$  No  '[]'$  Not sure '[]'$  Yes. Please describe:      3. Please provide other information to help [place] understand the patient's/caregiver's potential legal problem(s), including any concerns about how the problem(s) might affect the patient's/caregiver's health and well-being. Please include with this referral form any relevant documents if the patient/caregiver has them, such as a termination notice, denial letter, or court document.   N/A         4. Information about the Patient/Caregiver Please complete and review ALL information below with the patient/caregiver prior to sending this referral.  Patient Information Name: Lisa Haley Date of birth: [birth year]  If patient is   under 17 Name of parent/caregiver:  Parent's/caregiver's date of birth:  Address [place] of residence:    Home and/or mailing address: [place] [address]   Can we send mail to this address? '[]'$  Yes '[]'$  No *Please ask, for some, this is a matter of safety.  Phone #s *Please provide as many as possible Phone #1:   [phone]  Phone #2:   Can  we leave messages at each of these #s? '[]'$  Yes '[]'$  No *Please ask, for some, this is a matter of safety. Preferred day(s) and time(s) for calls?  Email address (not required but helpful)   Best days/ times to contact   Preferred Language  '[x]'$  English    '[]'$  Spanish   '[]'$  Other:        5. Information About Others Involved in the Potential Legal Problem Before we can offer legal assistance to the patient/caregiver, we must determine that we have no conflicts of interest with any potential adverse party. We will not contact the adverse party without the patient's/caregiver's permission.  If potential problem is with an agency  '[]'$  [place]                   '[]'$  [place] '[]'$  Other agency:   If problem is related to housing Name of landlord, Secondary school teacher, owner, housing authority, or [place]:  Phone #:     If problem is related to family safety or violence Name, phone number, address, and date of birth for the person(s) involved:       31. Consent for Referral and Follow Up  Option 1. Written Consent       (Initials) I give permission to [place] to refer me to Legal Aid of [place].   I give permission to Legal Aid of [place] to talk with [facility] about my/my family's potential legal problem(s) to help them to determine if they can help me and/or my family members to resolve these problems.  Signature of Patient or Parent/Caregiver:                                                                                                Today's date: 02/09/22    Signature of Referring Health Professional:                                                                                             Today's date: 02/09/22  Option 2. Verbal Consent - If Written Consent is Not Possible  '[x]'$  I have read the consent language written above (in Option 1) to the patient and/or the patient's parent/caregiver and obtained verbal permission from: [facility] of patient or parent/caregiver] Lisa Haley  to make this MLP referral on 02/09/22

## 2022-02-09 NOTE — Telephone Encounter (Signed)
Call was returned to patient and documented in another telephone encounter

## 2022-02-09 NOTE — Telephone Encounter (Signed)
Referral to Legal Aid [facility] [place] has been sent via Epic to attention of Novella Olive, attorney/LANC

## 2022-02-13 ENCOUNTER — Other Ambulatory Visit: Payer: Self-pay | Admitting: Gynecologic Oncology

## 2022-02-13 ENCOUNTER — Encounter: Payer: Self-pay | Admitting: Gynecologic Oncology

## 2022-02-13 ENCOUNTER — Ambulatory Visit: Payer: Commercial Managed Care - HMO | Admitting: Obstetrics and Gynecology

## 2022-02-13 ENCOUNTER — Telehealth: Payer: Self-pay | Admitting: *Deleted

## 2022-02-13 ENCOUNTER — Other Ambulatory Visit: Payer: Self-pay

## 2022-02-13 DIAGNOSIS — N838 Other noninflammatory disorders of ovary, fallopian tube and broad ligament: Secondary | ICD-10-CM

## 2022-02-13 MED ORDER — SENNOSIDES-DOCUSATE SODIUM 8.6-50 MG PO TABS
2.0000 | ORAL_TABLET | Freq: Every day | ORAL | 0 refills | Status: DC
  Filled 2022-02-13 – 2022-07-04 (×11): qty 30, 15d supply, fill #0

## 2022-02-13 NOTE — Telephone Encounter (Signed)
Telephone call to check on pre-operative status.  Patient compliant with pre-operative instructions.  Reinforced nothing to eat after midnight. Clear liquids until 0430. Patient to arrive at [place].  No questions or concerns voiced.  Instructed to call for any needs. Pt stated she has not been taking her tramadol or mobic medications. Informed her she can take them for pain management post surgery. Senna will be sent into [facility]. Pt stated just let them know that she needs it post op and they will deliver it to her by tomorrow evening. She verbalized understanding to education.

## 2022-02-13 NOTE — Anesthesia Preprocedure Evaluation (Signed)
Anesthesia Evaluation  Patient identified by MRN, date of birth, ID band Patient awake    Reviewed: Allergy & Precautions, NPO status , Patient's Chart, lab work & pertinent test results, reviewed documented beta blocker date and time , Unable to perform ROS - Chart review only  History of Anesthesia Complications Negative for: history of anesthetic complications  Airway Mallampati: III  TM Distance: >3 FB Neck ROM: Full    Dental no notable dental hx. (+) Dental Advisory Given, Teeth Intact   Pulmonary Current Smoker and Patient abstained from smoking.,   Pulmonary edema seen on CT    Pulmonary exam normal breath sounds clear to auscultation       Cardiovascular hypertension, Pt. on medications and Pt. on home beta blockers Normal cardiovascular exam Rhythm:Regular Rate:Normal     Neuro/Psych  Headaches,  SAH w/ concomitant seizure  CTA Head/neck - IMPRESSION: 1. Abundant subarachnoid hemorrhage but NO intracranial aneurysm is identified on this initial CTA. In the absence of a follow-up conventional cerebral angiogram, a repeat CTA Head is recommended (e.g. in 12-24 hrs). 2. Mildly irregular appearance of the supraclinoid Right ICA and Right MCA branches suspicious for vasospasm. 3. Abnormal pulmonary ground-glass opacity suspicious for Acute Pulmonary Edema. 4. Mild cervical carotid atherosclerosis without stenosis. No significant intracranial atherosclerosis.  Drowsy, but arousable to loud voice CVA negative psych ROS   GI/Hepatic Neg liver ROS, GERD  ,  Endo/Other  Morbid obesity Possible undiagnosed DM   Renal/GU negative Renal ROS     Musculoskeletal  (+) Arthritis ,   Abdominal (+) + obese,   Peds  Hematology  (+) Blood dyscrasia, anemia ,   Anesthesia Other Findings   Reproductive/Obstetrics negative OB ROS  s/p hysterectomy                           Anesthesia  Physical  Anesthesia Plan  ASA: 3  Anesthesia Plan: General   Post-op Pain Management: Tylenol PO (pre-op)*, Toradol IV (intra-op)*, Gabapentin PO (pre-op)* and Precedex   Induction: Intravenous  PONV Risk Score and Plan: 4 or greater and Treatment may vary due to age or medical condition, Midazolam, Scopolamine patch - Pre-op, Ondansetron and Dexamethasone  Airway Management Planned: Oral ETT  Additional Equipment: None  Intra-op Plan:   Post-operative Plan: Extubation in OR  Informed Consent: I have reviewed the patients History and Physical, chart, labs and discussed the procedure including the risks, benefits and alternatives for the proposed anesthesia with the patient or authorized representative who has indicated his/her understanding and acceptance.     Dental advisory given  Plan Discussed with: CRNA  Anesthesia Plan Comments: (2 x PIV )       Anesthesia Quick Evaluation

## 2022-02-13 NOTE — Patient Instructions (Signed)
Prior to prescribing post-operative medications, PMP aware checked. Her PMP aware, patient was prescribed 14 tablets of tramadol on 01/27/22 by her PCP, Karle Plumber MD. This can be found in [place]. A newer script for #60 tramadol noted from 02/03/22 by Dr. Karle Plumber. Due to this, no additional pain medication to be sent in. RN to call patient to discuss.

## 2022-02-14 ENCOUNTER — Encounter (HOSPITAL_COMMUNITY): Payer: Self-pay | Admitting: Gynecologic Oncology

## 2022-02-14 ENCOUNTER — Other Ambulatory Visit (HOSPITAL_COMMUNITY): Payer: Self-pay

## 2022-02-14 ENCOUNTER — Other Ambulatory Visit: Payer: Self-pay

## 2022-02-14 ENCOUNTER — Encounter (HOSPITAL_COMMUNITY)
Admission: RE | Disposition: A | Discharge status: Home / Self Care | Payer: Self-pay | Source: Ambulatory Visit | Attending: Gynecologic Oncology

## 2022-02-14 ENCOUNTER — Ambulatory Visit (HOSPITAL_COMMUNITY): Payer: Commercial Managed Care - HMO | Admitting: Physician Assistant

## 2022-02-14 ENCOUNTER — Ambulatory Visit (HOSPITAL_BASED_OUTPATIENT_CLINIC_OR_DEPARTMENT_OTHER): Payer: Commercial Managed Care - HMO | Admitting: Certified Registered Nurse Anesthetist

## 2022-02-14 ENCOUNTER — Other Ambulatory Visit: Payer: Self-pay | Admitting: Internal Medicine

## 2022-02-14 ENCOUNTER — Ambulatory Visit (HOSPITAL_COMMUNITY)
Admission: RE | Admit: 2022-02-14 | Discharge: 2022-02-14 | Disposition: A | Discharge status: Home / Self Care | Payer: Commercial Managed Care - HMO | Source: Ambulatory Visit | Attending: Gynecologic Oncology | Admitting: Gynecologic Oncology

## 2022-02-14 ENCOUNTER — Other Ambulatory Visit: Payer: Self-pay | Admitting: Interventional Cardiology

## 2022-02-14 DIAGNOSIS — F17210 Nicotine dependence, cigarettes, uncomplicated: Secondary | ICD-10-CM | POA: Insufficient documentation

## 2022-02-14 DIAGNOSIS — D271 Benign neoplasm of left ovary: Secondary | ICD-10-CM | POA: Diagnosis not present

## 2022-02-14 DIAGNOSIS — N736 Female pelvic peritoneal adhesions (postinfective): Secondary | ICD-10-CM

## 2022-02-14 DIAGNOSIS — Z6841 Body Mass Index (BMI) 40.0 and over, adult: Secondary | ICD-10-CM | POA: Diagnosis not present

## 2022-02-14 DIAGNOSIS — N9489 Other specified conditions associated with female genital organs and menstrual cycle: Secondary | ICD-10-CM

## 2022-02-14 DIAGNOSIS — N839 Noninflammatory disorder of ovary, fallopian tube and broad ligament, unspecified: Secondary | ICD-10-CM | POA: Insufficient documentation

## 2022-02-14 DIAGNOSIS — Z8673 Personal history of transient ischemic attack (TIA), and cerebral infarction without residual deficits: Secondary | ICD-10-CM | POA: Insufficient documentation

## 2022-02-14 DIAGNOSIS — R7989 Other specified abnormal findings of blood chemistry: Secondary | ICD-10-CM

## 2022-02-14 DIAGNOSIS — D282 Benign neoplasm of uterine tubes and ligaments: Secondary | ICD-10-CM | POA: Insufficient documentation

## 2022-02-14 DIAGNOSIS — R1900 Intra-abdominal and pelvic swelling, mass and lump, unspecified site: Secondary | ICD-10-CM | POA: Insufficient documentation

## 2022-02-14 DIAGNOSIS — N838 Other noninflammatory disorders of ovary, fallopian tube and broad ligament: Secondary | ICD-10-CM

## 2022-02-14 DIAGNOSIS — I10 Essential (primary) hypertension: Secondary | ICD-10-CM | POA: Diagnosis not present

## 2022-02-14 HISTORY — PX: ROBOTIC ASSISTED BILATERAL SALPINGO OOPHERECTOMY: SHX6078

## 2022-02-14 LAB — BASIC METABOLIC PANEL
Anion gap: 8 (ref 5–15)
BUN: 15 mg/dL (ref 6–20)
CO2: 20 mmol/L — ABNORMAL LOW (ref 22–32)
Calcium: 8.7 mg/dL — ABNORMAL LOW (ref 8.9–10.3)
Chloride: 110 mmol/L (ref 98–111)
Creatinine, Ser: 0.95 mg/dL (ref 0.44–1.00)
GFR, Estimated: >60 mL/min (ref >60)
Glucose, Bld: 85 mg/dL (ref 70–99)
Potassium: 5.4 mmol/L — ABNORMAL HIGH (ref 3.5–5.1)
Sodium: 138 mmol/L (ref 135–145)

## 2022-02-14 SURGERY — SALPINGO-OOPHORECTOMY, BILATERAL, ROBOT-ASSISTED
Anesthesia: General | Site: Abdomen

## 2022-02-14 MED ORDER — OXYCODONE HCL 5 MG PO TABS
5.0000 mg | ORAL_TABLET | Freq: Once | ORAL | Status: AC
  Administered 2022-02-14: 5 mg via ORAL

## 2022-02-14 MED ORDER — HEPARIN SODIUM (PORCINE) 5000 UNIT/ML IJ SOLN
5000.0000 [IU] | INTRAMUSCULAR | Status: AC
  Administered 2022-02-14: 5000 [IU] via SUBCUTANEOUS
  Filled 2022-02-14: qty 1

## 2022-02-14 MED ORDER — MIDAZOLAM HCL 5 MG/5ML IJ SOLN
INTRAMUSCULAR | Status: DC | PRN
  Administered 2022-02-14: 2 mg via INTRAVENOUS

## 2022-02-14 MED ORDER — FENTANYL CITRATE (PF) 250 MCG/5ML IJ SOLN
INTRAMUSCULAR | Status: AC
  Filled 2022-02-14: qty 5

## 2022-02-14 MED ORDER — LACTATED RINGERS IV SOLN: INTRAVENOUS | Status: DC

## 2022-02-14 MED ORDER — BUPIVACAINE HCL 0.25 % IJ SOLN
INTRAMUSCULAR | Status: AC
  Filled 2022-02-14: qty 1

## 2022-02-14 MED ORDER — LACTATED RINGERS IR SOLN
Status: DC | PRN
  Administered 2022-02-14: 1000 mL

## 2022-02-14 MED ORDER — SCOPOLAMINE 1 MG/3DAYS TD PT72
1.0000 | MEDICATED_PATCH | TRANSDERMAL | Status: DC
  Administered 2022-02-14: 1.5 mg via TRANSDERMAL
  Filled 2022-02-14: qty 1

## 2022-02-14 MED ORDER — FENTANYL CITRATE (PF) 100 MCG/2ML IJ SOLN
INTRAMUSCULAR | Status: DC | PRN
  Administered 2022-02-14: 50 ug via INTRAVENOUS
  Administered 2022-02-14 (×2): 100 ug via INTRAVENOUS

## 2022-02-14 MED ORDER — ROCURONIUM BROMIDE 10 MG/ML (PF) SYRINGE
PREFILLED_SYRINGE | INTRAVENOUS | Status: AC
  Filled 2022-02-14: qty 10

## 2022-02-14 MED ORDER — PHENYLEPHRINE 80 MCG/ML (10ML) SYRINGE FOR IV PUSH (FOR BLOOD PRESSURE SUPPORT)
PREFILLED_SYRINGE | INTRAVENOUS | Status: AC
  Filled 2022-02-14: qty 10

## 2022-02-14 MED ORDER — LIDOCAINE HCL (PF) 2 % IJ SOLN
INTRAMUSCULAR | Status: AC
  Filled 2022-02-14: qty 5

## 2022-02-14 MED ORDER — CELECOXIB 200 MG PO CAPS
200.0000 mg | ORAL_CAPSULE | ORAL | Status: AC
  Administered 2022-02-14: 200 mg via ORAL
  Filled 2022-02-14: qty 1

## 2022-02-14 MED ORDER — CHLORHEXIDINE GLUCONATE 0.12 % MT SOLN
15.0000 mL | Freq: Once | OROMUCOSAL | Status: AC
  Administered 2022-02-14: 15 mL via OROMUCOSAL

## 2022-02-14 MED ORDER — MEPERIDINE HCL 50 MG/ML IJ SOLN: 6.2500 mg | INTRAMUSCULAR | Status: DC | PRN

## 2022-02-14 MED ORDER — PROPOFOL 10 MG/ML IV BOLUS
INTRAVENOUS | Status: AC
  Filled 2022-02-14: qty 20

## 2022-02-14 MED ORDER — ONDANSETRON HCL 4 MG/2ML IJ SOLN
INTRAMUSCULAR | Status: AC
  Filled 2022-02-14: qty 2

## 2022-02-14 MED ORDER — PHENYLEPHRINE HCL (PRESSORS) 10 MG/ML IV SOLN
INTRAVENOUS | Status: DC | PRN
  Administered 2022-02-14 (×10): 80 ug via INTRAVENOUS

## 2022-02-14 MED ORDER — ONDANSETRON HCL 4 MG/2ML IJ SOLN
INTRAMUSCULAR | Status: DC | PRN
  Administered 2022-02-14: 4 mg via INTRAVENOUS

## 2022-02-14 MED ORDER — LIDOCAINE HCL (CARDIAC) PF 100 MG/5ML IV SOSY
PREFILLED_SYRINGE | INTRAVENOUS | Status: DC | PRN
  Administered 2022-02-14: 100 mg via INTRAVENOUS

## 2022-02-14 MED ORDER — STERILE WATER FOR IRRIGATION IR SOLN
Status: DC | PRN
  Administered 2022-02-14: 1000 mL

## 2022-02-14 MED ORDER — ROCURONIUM BROMIDE 100 MG/10ML IV SOLN
INTRAVENOUS | Status: DC | PRN
  Administered 2022-02-14 (×2): 20 mg via INTRAVENOUS
  Administered 2022-02-14: 80 mg via INTRAVENOUS

## 2022-02-14 MED ORDER — DEXAMETHASONE SODIUM PHOSPHATE 10 MG/ML IJ SOLN
INTRAMUSCULAR | Status: AC
  Filled 2022-02-14: qty 1

## 2022-02-14 MED ORDER — GABAPENTIN 300 MG PO CAPS
300.0000 mg | ORAL_CAPSULE | ORAL | Status: AC
  Administered 2022-02-14: 300 mg via ORAL

## 2022-02-14 MED ORDER — GABAPENTIN 300 MG PO CAPS
300.0000 mg | ORAL_CAPSULE | Freq: Once | ORAL | Status: AC
  Filled 2022-02-14: qty 1

## 2022-02-14 MED ORDER — DEXAMETHASONE SODIUM PHOSPHATE 4 MG/ML IJ SOLN: 4.0000 mg | INTRAMUSCULAR | Status: DC

## 2022-02-14 MED ORDER — DEXAMETHASONE SODIUM PHOSPHATE 4 MG/ML IJ SOLN
INTRAMUSCULAR | Status: DC | PRN
  Administered 2022-02-14: 10 mg via INTRAVENOUS

## 2022-02-14 MED ORDER — OXYCODONE HCL 5 MG PO TABS
ORAL_TABLET | ORAL | Status: AC
  Filled 2022-02-14: qty 1

## 2022-02-14 MED ORDER — ACETAMINOPHEN 500 MG PO TABS: 1000.0000 mg | ORAL_TABLET | ORAL | Status: AC

## 2022-02-14 MED ORDER — BUPIVACAINE HCL 0.25 % IJ SOLN
INTRAMUSCULAR | Status: DC | PRN
  Administered 2022-02-14: 35 mL

## 2022-02-14 MED ORDER — ACETAMINOPHEN 500 MG PO TABS
1000.0000 mg | ORAL_TABLET | Freq: Once | ORAL | Status: AC
  Administered 2022-02-14: 1000 mg via ORAL
  Filled 2022-02-14: qty 2

## 2022-02-14 MED ORDER — ORAL CARE MOUTH RINSE: 15.0000 mL | Freq: Once | OROMUCOSAL | Status: AC

## 2022-02-14 MED ORDER — SUGAMMADEX SODIUM 200 MG/2ML IV SOLN
INTRAVENOUS | Status: DC | PRN
  Administered 2022-02-14: 200 mg via INTRAVENOUS

## 2022-02-14 MED ORDER — PROMETHAZINE HCL 25 MG/ML IJ SOLN: 6.2500 mg | INTRAMUSCULAR | Status: DC | PRN

## 2022-02-14 MED ORDER — MIDAZOLAM HCL 2 MG/2ML IJ SOLN
INTRAMUSCULAR | Status: AC
  Filled 2022-02-14: qty 2

## 2022-02-14 MED ORDER — SCOPOLAMINE 1 MG/3DAYS TD PT72: 1.0000 | MEDICATED_PATCH | TRANSDERMAL | Status: DC

## 2022-02-14 MED ORDER — HYDROMORPHONE HCL 1 MG/ML IJ SOLN
0.2500 mg | INTRAMUSCULAR | Status: DC | PRN
  Administered 2022-02-14 (×4): 0.5 mg via INTRAVENOUS

## 2022-02-14 MED ORDER — HYDROMORPHONE HCL 1 MG/ML IJ SOLN
INTRAMUSCULAR | Status: AC
  Filled 2022-02-14: qty 2

## 2022-02-14 MED ORDER — AMISULPRIDE (ANTIEMETIC) 5 MG/2ML IV SOLN: 10.0000 mg | Freq: Once | INTRAVENOUS | Status: DC | PRN

## 2022-02-14 MED ORDER — PROPOFOL 10 MG/ML IV BOLUS
INTRAVENOUS | Status: DC | PRN
  Administered 2022-02-14: 200 mg via INTRAVENOUS

## 2022-02-14 SURGICAL SUPPLY — 76 items
APPLICATOR SURGIFLO ENDO (HEMOSTASIS) IMPLANT
BAG LAPAROSCOPIC 12 15 PORT 16 (BASKET) IMPLANT
BAG RETRIEVAL 12/15 (BASKET) ×2
BLADE SURG SZ10 CARB STEEL (BLADE) IMPLANT
CHLORAPREP W/TINT 26 (MISCELLANEOUS) ×1 IMPLANT
COVER BACK TABLE 60X90IN (DRAPES) ×2 IMPLANT
COVER TIP SHEARS 8 DVNC (MISCELLANEOUS) ×1 IMPLANT
COVER TIP SHEARS 8MM DA VINCI (MISCELLANEOUS) ×1
DERMABOND ADVANCED (GAUZE/BANDAGES/DRESSINGS) ×1
DERMABOND ADVANCED .7 DNX12 (GAUZE/BANDAGES/DRESSINGS) ×1 IMPLANT
DRAPE ARM DVNC X/XI (DISPOSABLE) ×4 IMPLANT
DRAPE COLUMN DVNC XI (DISPOSABLE) ×1 IMPLANT
DRAPE DA VINCI XI ARM (DISPOSABLE) ×4
DRAPE DA VINCI XI COLUMN (DISPOSABLE) ×1
DRAPE SHEET LG 3/4 BI-LAMINATE (DRAPES) ×2 IMPLANT
DRAPE SURG IRRIG POUCH 19X23 (DRAPES) ×2 IMPLANT
DRSG OPSITE POSTOP 4X6 (GAUZE/BANDAGES/DRESSINGS) IMPLANT
DRSG OPSITE POSTOP 4X8 (GAUZE/BANDAGES/DRESSINGS) IMPLANT
DRSG TEGADERM 8X12 (GAUZE/BANDAGES/DRESSINGS) ×1 IMPLANT
ELECT PENCIL ROCKER SW 15FT (MISCELLANEOUS) IMPLANT
ELECT REM PT RETURN 15FT ADLT (MISCELLANEOUS) ×2 IMPLANT
GAUZE 4X4 16PLY ~~LOC~~+RFID DBL (SPONGE) ×4 IMPLANT
GLOVE BIO SURGEON STRL SZ 6 (GLOVE) ×8 IMPLANT
GLOVE BIO SURGEON STRL SZ 6.5 (GLOVE) IMPLANT
GOWN STRL REUS W/ TWL LRG LVL3 (GOWN DISPOSABLE) ×4 IMPLANT
GOWN STRL REUS W/TWL LRG LVL3 (GOWN DISPOSABLE) ×4
HOLDER FOLEY CATH W/STRAP (MISCELLANEOUS) IMPLANT
IRRIG SUCT STRYKERFLOW 2 WTIP (MISCELLANEOUS) ×2
IRRIGATION SUCT STRKRFLW 2 WTP (MISCELLANEOUS) ×1 IMPLANT
KIT PROCEDURE DA VINCI SI (MISCELLANEOUS)
KIT PROCEDURE DVNC SI (MISCELLANEOUS) IMPLANT
KIT TURNOVER KIT A (KITS) IMPLANT
LIGASURE IMPACT 36 18CM CVD LR (INSTRUMENTS) IMPLANT
MANIPULATOR ADVINCU DEL 3.0 PL (MISCELLANEOUS) IMPLANT
MANIPULATOR ADVINCU DEL 3.5 PL (MISCELLANEOUS) IMPLANT
MANIPULATOR UTERINE 4.5 ZUMI (MISCELLANEOUS) IMPLANT
NDL HYPO 21X1.5 SAFETY (NEEDLE) ×1 IMPLANT
NDL SPNL 18GX3.5 QUINCKE PK (NEEDLE) IMPLANT
NEEDLE HYPO 21X1.5 SAFETY (NEEDLE) ×2 IMPLANT
NEEDLE SPNL 18GX3.5 QUINCKE PK (NEEDLE) IMPLANT
OBTURATOR OPTICAL STANDARD 8MM (TROCAR) ×1
OBTURATOR OPTICAL STND 8 DVNC (TROCAR) ×1
OBTURATOR OPTICALSTD 8 DVNC (TROCAR) ×1 IMPLANT
PACK ROBOT GYN CUSTOM WL (TRAY / TRAY PROCEDURE) ×2 IMPLANT
PAD POSITIONING PINK XL (MISCELLANEOUS) ×2 IMPLANT
PORT ACCESS TROCAR AIRSEAL 12 (TROCAR) ×1 IMPLANT
PORT ACCESS TROCAR AIRSEAL 5M (TROCAR) ×1
SCRUB CHG 4% DYNA-HEX 4OZ (MISCELLANEOUS) ×3 IMPLANT
SEAL CANN UNIV 5-8 DVNC XI (MISCELLANEOUS) ×4 IMPLANT
SEAL XI 5MM-8MM UNIVERSAL (MISCELLANEOUS) ×4
SET TRI-LUMEN FLTR TB AIRSEAL (TUBING) ×2 IMPLANT
SPIKE FLUID TRANSFER (MISCELLANEOUS) ×2 IMPLANT
SPONGE T-LAP 18X18 ~~LOC~~+RFID (SPONGE) IMPLANT
SURGIFLO W/THROMBIN 8M KIT (HEMOSTASIS) IMPLANT
SUT MNCRL AB 4-0 PS2 18 (SUTURE) IMPLANT
SUT PDS AB 1 TP1 96 (SUTURE) IMPLANT
SUT VIC AB 0 CT1 27 (SUTURE)
SUT VIC AB 0 CT1 27XBRD ANTBC (SUTURE) IMPLANT
SUT VIC AB 2-0 CT1 27 (SUTURE) ×1
SUT VIC AB 2-0 CT1 TAPERPNT 27 (SUTURE) IMPLANT
SUT VIC AB 2-0 SH 27 (SUTURE) ×1
SUT VIC AB 2-0 SH 27X BRD (SUTURE) IMPLANT
SUT VIC AB 4-0 PS2 18 (SUTURE) ×4 IMPLANT
SYR 10ML LL (SYRINGE) IMPLANT
SYR BULB IRRIG 60ML STRL (SYRINGE) ×1 IMPLANT
SYS BAG RETRIEVAL 10MM (BASKET)
SYS WOUND ALEXIS 18CM MED (MISCELLANEOUS)
SYSTEM BAG RETRIEVAL 10MM (BASKET) IMPLANT
SYSTEM WOUND ALEXIS 18CM MED (MISCELLANEOUS) IMPLANT
TOWEL OR NON WOVEN STRL DISP B (DISPOSABLE) IMPLANT
TRAP SPECIMEN MUCUS 40CC (MISCELLANEOUS) ×1 IMPLANT
TRAY FOLEY MTR SLVR 16FR STAT (SET/KITS/TRAYS/PACK) ×2 IMPLANT
TROCAR Z-THREAD FIOS 5X100MM (TROCAR) IMPLANT
UNDERPAD 30X36 HEAVY ABSORB (UNDERPADS AND DIAPERS) ×4 IMPLANT
WATER STERILE IRR 1000ML POUR (IV SOLUTION) ×2 IMPLANT
YANKAUER SUCT BULB TIP 10FT TU (MISCELLANEOUS) IMPLANT

## 2022-02-14 NOTE — Anesthesia Procedure Notes (Signed)
Procedure Name: Intubation Date/Time: 02/14/2022 7:40 AM  Performed by: Justice Rocher, CRNAPre-anesthesia Checklist: Patient identified, Emergency Drugs available, Suction available, Patient being monitored and Timeout performed Patient Re-evaluated:Patient Re-evaluated prior to induction Oxygen Delivery Method: Circle system utilized Preoxygenation: Pre-oxygenation with 100% oxygen Induction Type: IV induction Ventilation: Mask ventilation without difficulty Laryngoscope Size: Mac, 3 and Glidescope Grade View: Grade III Tube type: Oral Tube size: 7.0 mm Number of attempts: 1 Airway Equipment and Method: Stylet and Oral airway Placement Confirmation: ETT inserted through vocal cords under direct vision, positive ETCO2, breath sounds checked- equal and bilateral and CO2 detector Secured at: 23 cm Tube secured with: Tape Dental Injury: Teeth and Oropharynx as per pre-operative assessment  Difficulty Due To: Difficulty was unanticipated Comments: Poor dentition preop. Smooth IV induction. DL x 1 unable to visualized. Glidescope utilized DL x1 Mac 3 ETT placed with no difficulty BBS

## 2022-02-14 NOTE — Transfer of Care (Signed)
Immediate Anesthesia Transfer of Care Note  Patient: Shellia Hartl  Procedure(s) Performed: Procedure(s) (LRB): XI ROBOTIC ASSISTED UNILATERAL SALPINGO OOPHORECTOMY, LYSIS OF ADHESIONS, LEFT URETEROLYSIS (N/A)  Patient Location: PACU  Anesthesia Type: General  Level of Consciousness: awake, sedated, patient cooperative and responds to stimulation  Airway & Oxygen Therapy: Patient Spontanous Breathing and Patient connected to face mask oxygen  Post-op Assessment: Report given to PACU RN, Post -op Vital signs reviewed and stable and Patient moving all extremities  Post vital signs: Reviewed and stable  Complications: No apparent anesthesia complications

## 2022-02-14 NOTE — Op Note (Signed)
OPERATIVE NOTE  Pre-operative Diagnosis: Adnexal mass  Post-operative Diagnosis: same, significant pelvic adhesive disease, benign cystic mass  Operation: Robotic-assisted laparoscopic left salpingo-oophorectomy, lysis of adhesions of 60 minutes, left ureterolysis, oversew of rectal serosa.   Surgeon: Jeral Pinch MD  Assistant Surgeon: Lahoma Crocker MD (an MD assistant was necessary for tissue manipulation, management of robotic instrumentation, retraction and positioning due to the complexity of the case and hospital policies).   Anesthesia: GET  Urine Output: 350 cc   Operative Findings:  On EUA, fullness appreciated in the cul de sac, moderately mobile. On intra-abdominal entry, normal upper abdominal survey. Omentum with some adhesions to the anterior abdominal wall. Normal small and large bowel. No ascites. Surgically absent right fallopian tube, small right ovarian remnant along pelvic brim, normal in appearance. Sigmoid colon with diverticula, adherent to left sidewall and covering left adnexa. After mobilization, 6 cm cystic and smooth ovarian lesion, retroperitonealized and adherent to surround retroperitoneum. Left ureterolysis required to mobilize the ureter inferiorly and laterally. Rectum densely adherent to the vaginal cuff and near inferior aspect of the adnexal mass. Some mobilization of the rectum required. Given small serosal disruption of the rectum, this area was oversewn. Negative bubble test at the end of surgery. Dilated fallopian tube and benign cyst noted by pathology, nothing to freeze.   Estimated Blood Loss:  50 cc      Total IV Fluids: see I&O flowsheet         Specimens: left tube and ovary, pelvic washings         Complications:  None apparent; patient tolerated the procedure well.         Disposition: PACU - hemodynamically stable.  Procedure Details  The patient was seen in the Holding Room. The risks, benefits, complications, treatment  options, and expected outcomes were discussed with the patient.  The patient concurred with the proposed plan, giving informed consent.  The site of surgery properly noted/marked. The patient was identified as Lisa Haley and the procedure verified as a Robotic-assisted unilateral salpingo-oophorectomy with any other indicated procedures.   After induction of anesthesia, the patient was draped and prepped in the usual sterile manner. Patient was placed in supine position after anesthesia and draped and prepped in the usual sterile manner as follows: Her arms were tucked to her side with all appropriate precautions.  The patient was secured to the bed with padding and tape across her chest.  The patient was placed in the semi-lithotomy position in [place].  The perineum and vagina were prepped with CholoraPrep. The patient was draped after the CholoraPrep had been allowed to dry for 3 minutes.  A Time Out was held and the above information confirmed.  The urethra was prepped with Betadine. Foley catheter was placed.  OG tube placement was confirmed and to suction.   Next, a 10 mm skin incision was made 1 cm below the subcostal margin in the midclavicular line.  The 5 mm Optiview port and scope was used for direct entry.  Opening pressure was under 10 mm CO2.  The abdomen was insufflated and the findings were noted as above.   At this point and all points during the procedure, the patient's intra-abdominal pressure did not exceed 15 mmHg. Next, an 8 mm skin incision was made superior to the umbilicus and a right and left port were placed about 8 cm lateral to the robot port on the right and left side.  A fourth arm was placed on the  right.  The 5 mm assist trocar was exchanged for a 10-12 mm port. All ports were placed under direct visualization. Sharp dissection was used with the laparoscopic scissors to lyse omental adhesions to the anterior abdominal wall just inferior to the umbilicus to allow  for supraumbilical port placement. The patient was placed in steep Trendelenburg. The robot was docked in the normal manner.  The remaining omental adhesion to the anterior abdominal wall were lysed using blunt dissection and monopolar electrocautery.   The sigmoid colon epiploica were mobilized from the left lateral side wall using monopolar electrocautery and sharp dissection. The left peritoneum was opened parallel to the IP ligament to open the retroperitoneal space.  The ureter was noted to be on the medial leaf of the broad ligament.  The ureter was mobilized laterally, freeing attachments to the adventitia.  The colon mesentery was mobilized from the medial aspect of the IP ligament and mass.  The mass was then elevated and after ensuring lateral mobilization of the ureter, the infundibulopelvic ligament and ovarian vessels were cauterized and transected.  The fourth arm was used to mobilize the adnexal mass superiorly and/or medially, with frequent changes and location of the mass to aid with the dissection.  While keeping the ureter in view after it had been mobilized below the level of the distal portion of the adnexal mass, the adventitia and retroperitoneal attachments of the mass laterally were lysed bluntly and with short bursts of monopolar electrocautery.  What appeared to be the remnant round ligament was cauterized and transected.  Inferiorly, the mass abutted the superior aspect of the bladder and vaginal cuff.  A sponge stick was placed in the vaginal cuff to help delineate the cuff itself.  Near the inferior aspect of the dissection, there was concern that the rectum was also adherent to the adnexal mass.  The peritoneum above the ureter was incised and stretched and the infundibulopelvic ligament was skeletonized, cauterized and cut.  The utero-ovarian ligament and fallopian tube were skeletonized, cauterized and transected just lateral to the uterine fundus, freeing the adnexa.  The  sigmoid and rectal mesentery were mobilized laterally from the left sidewall.  Attention was then turned to the right and the sigmoid epiploica and sigmoid were mobilized from the right pelvic sidewall.    Given densely adherent rectum to the vaginal cuff, the decision was made to leave the rectum attached here.  Once the rectum had been identified as being free from the inferior aspect of the adnexal mass, the incision along the broad ligament and peritoneum was carried inferiorly until the adnexal mass was completely excised.  This was placed in Endo Catch bag.  Attention was then turned back to the rectum.  There was a small, 2-3 mm of what appeared to be a serosal defect.  This area was oversewn with multiple interrupted stitches using 2-0 Vicryl suture.  Pelvis was irrigated and excellent hemostasis noted. Bubble test was performed after the patient was taken out of some Trendelenburg. The sigmoid colon was occluded, the pelvis filled with sterile fluid and air was placed in the anus and rectum. No bubbles were noted.   The adnexal mass in the Endo Catch bag was brought to and through the assist trocar.  The mass was decompressed with clear fluid drained.  The adnexa was then delivered through the bag in a contained manner and sent for frozen section.  The assist trocar was replaced in the abdomen under direct visualization.  Irrigation was used  again and excellent hemostasis was achieved.  Once the frozen section returned, the procedure was completed.  Robotic instruments were removed under direct visulaization. The robot was undocked. The fascia at the 10-12 mm port was closed with 0 Vicryl on a UR-5 needle.  The subcuticular tissue was closed with 4-0 Vicryl and the skin was closed with 4-0 Monocryl in a subcuticular manner.  Dermabond was applied.    Spongestick was removed from the vagina. Foley catheter was removed.  All sponge, lap and needle counts were correct x  3.   The patient was  transferred to the recovery room in stable condition.  Jeral Pinch, MD

## 2022-02-14 NOTE — Interval H&P Note (Signed)
History and Physical Interval Note:  02/14/2022 6:36 AM  Lisa Haley  has presented today for surgery, with the diagnosis of OVARIAN MASS.  The various methods of treatment have been discussed with the patient and family. After consideration of risks, benefits and other options for treatment, the patient has consented to  Procedure(s): XI ROBOTIC ASSISTED UNILATERAL SALPINGO OOPHORECTOMY,POSSIBLE STAGING, POSSIBLE LAPAROTOMY (N/A) as a surgical intervention.  The patient's history has been reviewed, patient examined, no change in status, stable for surgery.  I have reviewed the patient's chart and labs.  Questions were answered to the patient's satisfaction.     Lafonda Mosses

## 2022-02-14 NOTE — Anesthesia Postprocedure Evaluation (Signed)
Anesthesia Post Note  Patient: Lisa Haley  Procedure(s) Performed: XI ROBOTIC ASSISTED UNILATERAL SALPINGO OOPHORECTOMY, LYSIS OF ADHESIONS, LEFT URETEROLYSIS (Abdomen)     Patient location during evaluation: PACU Anesthesia Type: General Level of consciousness: sedated and patient cooperative Pain management: pain level controlled Vital Signs Assessment: post-procedure vital signs reviewed and stable Respiratory status: spontaneous breathing Cardiovascular status: stable Anesthetic complications: yes   Encounter Notable Events  Notable Event Outcome Phase Comment  Difficult to intubate - unexpected  Intraprocedure Filed from anesthesia note documentation.    Last Vitals:  Vitals:   02/14/22 1145 02/14/22 1207  BP: 130/77 (!) 146/75  Pulse: 72 83  Resp: 10 12  Temp: 36.4 C   SpO2: 100% 97%    Last Pain:  Vitals:   02/14/22 1215  PainSc: [place]

## 2022-02-14 NOTE — Discharge Instructions (Addendum)
AFTER SURGERY INSTRUCTIONS   Return to work: 4-6 weeks if applicable   Activity: 1. Be up and out of the bed during the day.  Take a nap if needed.  You may walk up steps but be careful and use the hand rail.  Stair climbing will tire you more than you think, you may need to stop part way and rest.    2. No lifting or straining for 6 weeks over 10 pounds. No pushing, pulling, straining for 6 weeks.   3. No driving for around 1 week(s).  Do not drive if you are taking narcotic pain medicine and make sure that your reaction time has returned.    4. You can shower as soon as the next day after surgery. Shower daily.  Use your regular soap and water (not directly on the incision) and pat your incision(s) dry afterwards; don't rub.  No tub baths or submerging your body in water until cleared by your surgeon. If you have the soap that was given to you by pre-surgical testing that was used before surgery, you do not need to use it afterwards because this can irritate your incisions.    5. No sexual activity and nothing in the vagina for 4 weeks.   6. You may experience a small amount of clear drainage from your incisions, which is normal.  If the drainage persists, increases, or changes color please call the office.   7. Do not use creams, lotions, or ointments such as neosporin on your incisions after surgery until advised by your surgeon because they can cause removal of the dermabond glue on your incisions.     8. Take Tylenol or ibuprofen (or meloxicam (mobic), do not take together since they work similarly) first for pain if you are able to take these medications and only use narcotic pain medication for severe pain not relieved by the Tylenol or Ibuprofen.  Monitor your Tylenol intake to a max of 4,000 mg in a 24 hour period.   Diet: 1. Low sodium Heart Healthy Diet is recommended but you are cleared to resume your normal (before surgery) diet after your procedure.   2. It is safe to use a  laxative, such as Miralax or Colace, if you have difficulty moving your bowels. Avoiding straining.  You will be prescribed Sennakot-S to take at bedtime every evening after surgery to keep bowel movements regular and to prevent constipation.     Wound Care: 1. Keep clean and dry.  Shower daily.   Reasons to call the Doctor: Fever - Oral temperature greater than 100.4 degrees Fahrenheit Foul-smelling vaginal discharge Difficulty urinating Nausea and vomiting Increased pain at the site of the incision that is unrelieved with pain medicine. Difficulty breathing with or without chest pain New calf pain especially if only on one side Sudden, continuing increased vaginal bleeding with or without clots.   Contacts: For questions or concerns you should contact:   Dr. Jeral Pinch at [phone]   Joylene John, NP at [phone]   After Hours: call [phone] and have the GYN Oncologist paged/contacted (after 5 pm or on the weekends).   Messages sent via mychart are for non-urgent matters and are not responded to after hours so for urgent needs, please call the after hours number.

## 2022-02-15 ENCOUNTER — Other Ambulatory Visit (HOSPITAL_BASED_OUTPATIENT_CLINIC_OR_DEPARTMENT_OTHER): Payer: Self-pay

## 2022-02-15 ENCOUNTER — Encounter (HOSPITAL_COMMUNITY): Payer: Self-pay | Admitting: Gynecologic Oncology

## 2022-02-15 ENCOUNTER — Other Ambulatory Visit: Payer: Self-pay

## 2022-02-15 ENCOUNTER — Other Ambulatory Visit (HOSPITAL_COMMUNITY): Payer: Self-pay

## 2022-02-15 ENCOUNTER — Telehealth: Payer: Self-pay

## 2022-02-15 ENCOUNTER — Other Ambulatory Visit: Payer: Self-pay | Admitting: Gynecologic Oncology

## 2022-02-15 DIAGNOSIS — G8918 Other acute postprocedural pain: Secondary | ICD-10-CM

## 2022-02-15 LAB — SURGICAL PATHOLOGY

## 2022-02-15 LAB — CYTOLOGY - NON PAP

## 2022-02-15 MED ORDER — OXYCODONE HCL 5 MG PO TABS
5.0000 mg | ORAL_TABLET | ORAL | 0 refills | Status: DC | PRN
  Filled 2022-02-15: qty 15, 3d supply, fill #0

## 2022-02-15 MED ORDER — METOPROLOL TARTRATE 100 MG PO TABS
100.0000 mg | ORAL_TABLET | Freq: Once | ORAL | 0 refills | Status: DC
  Filled 2022-02-15: qty 1, 1d supply, fill #0

## 2022-02-15 NOTE — Progress Notes (Signed)
See RN note. Patient stating tramadol is not helping with pain relief. Of note, she was prescribed #60 tramadol by her PCP at the end of July. Per Dr. Berline Lopes, patient needs to be taking a NSAID and plan to prescribe a short course of oxycodone.

## 2022-02-15 NOTE — Telephone Encounter (Signed)
Requested medication (s) are due for refill today: yes  Requested medication (s) are on the active medication list: yes  Last refill:  01/27/22  Future visit scheduled: no  Notes to clinic:  med not delegated to NT to RF   Requested Prescriptions  Pending Prescriptions Disp Refills   traMADol (ULTRAM) 50 MG tablet 14 tablet 0    Sig: take 1 tablet by mouth every 12hrs as needed for up  to 7 days     Not Delegated - Analgesics:  Opioid Agonists Failed - 02/14/2022  5:15 PM      Failed - This refill cannot be delegated      Failed - Urine Drug Screen completed in last 360 days      Passed - Valid encounter within last 3 months    Recent Outpatient Visits           1 week ago Pre-operative clearance   [place], Deborah B, MD   3 months ago Lower abdominal pain   [place], [place]   10 months ago Neuropathy   [place], [place]   1 year ago Left hip pain   [place], [place], [place]   1 year ago Need for influenza vaccination   [place], [facility]       Future Appointments             In 2 months Wynetta Emery Dalbert Batman, MD [place]

## 2022-02-15 NOTE — Telephone Encounter (Signed)
Spoke with Ms. Lisa Haley this morning. She states she is eating, drinking and urinating well. She has not had a BM yet but is passing gas. She will start with the senokot as prescribed today and encouraged her to drink plenty of water. She denies fever or chills. Incisions are dry and intact. She rates her pain 6/10. Her pain is not controlled with the tramadol 50 mg tabs. She has used 100 mg of tramadol last evening with 500 mg of tylenol and last dose of 100 mg with 500 mg of tylenol at 0400. She has not taken mobic for ~1 week due to surgery.  Pt. would like something stronger to help with the pain.  She does not want to take a lot of pills as that upsets her stomach. Told her that this will be reviewed with Dr. Berline Lopes and Zoila Shutter and call her back with recommendations. Suggested she take a tylenol 500 mg tab now.  Apply heat or ice to abdomen Pt verbalized understanding.P  Instructed to call office with any fever, chills, purulent drainage, uncontrolled pain or any other questions or concerns. Patient verbalizes understanding.   Pt aware of post op appointments as well as the office number [phone] and after hours number [phone] to call if she has any questions or concerns

## 2022-02-15 NOTE — Telephone Encounter (Signed)
Coralee Pesa that Dr. Berline Lopes sent in  #15  Oxycodone tablets for severe pain.  She will only give the 15 tabs.  She needs to alternate Tylenol 500 mg 1-2 tabs every 6 hours alt with Ibuprofen 400 mg every 8 hrs. Pt will have daughter p/u ibuprofen. She is not to use the tramadol while using the oxycodone. Pt verbalized understanding.

## 2022-02-16 NOTE — Telephone Encounter (Signed)
Called to follow up with Lisa Haley to see how her pain is doing today.  It has decreased with the use of the Oxycodone.  She did not pick up Ibuprofen.  Told her that Joylene John, NP said that since she is not using ibuprofen that she can resume her Mobic 7.5 mg daily. Pt verbalized understanding.

## 2022-02-19 ENCOUNTER — Other Ambulatory Visit: Payer: Self-pay | Admitting: Interventional Cardiology

## 2022-02-19 ENCOUNTER — Other Ambulatory Visit: Payer: Self-pay | Admitting: Gynecologic Oncology

## 2022-02-19 DIAGNOSIS — G8918 Other acute postprocedural pain: Secondary | ICD-10-CM

## 2022-02-20 ENCOUNTER — Other Ambulatory Visit (HOSPITAL_COMMUNITY): Payer: Self-pay

## 2022-02-20 ENCOUNTER — Other Ambulatory Visit: Payer: Self-pay

## 2022-02-20 NOTE — Telephone Encounter (Signed)
Ms Meloche states that her pain in her abdomen is a 5-6/10. She states that she is using the Oxycodone 5 mg bid. She reports that she resumed her mobic 7.5 mg daily last week as instructed.  She only has 2 extra strength tylenol left.  She is not working and cannot afford more tylenol. Told her to use heat alt with ice to her abdomen.  Pt states the right side abdominal swelling has decreased from last week. Pt have good BM 2 days ago. Continues with Senokot-s 1 tab in am and 2 at hs. She is eating and urinating well. She has 4 Oxycodone tabs left and wants a refill.  Told her that Zoila Shutter said that a refill will not be sent in and she can use the tramadol 50 mg tabs 1-2 q 8 hours prn. Pt hung the phone up. Attempted to call her back and phone went to vm.  Left message to have her call if she has any further questions or concerns.

## 2022-02-21 ENCOUNTER — Inpatient Hospital Stay (HOSPITAL_BASED_OUTPATIENT_CLINIC_OR_DEPARTMENT_OTHER): Payer: Commercial Managed Care - HMO | Admitting: Gynecologic Oncology

## 2022-02-21 ENCOUNTER — Other Ambulatory Visit: Payer: Self-pay | Admitting: Interventional Cardiology

## 2022-02-21 ENCOUNTER — Other Ambulatory Visit (HOSPITAL_COMMUNITY): Payer: Self-pay

## 2022-02-21 ENCOUNTER — Other Ambulatory Visit: Payer: Self-pay | Admitting: Gynecologic Oncology

## 2022-02-21 ENCOUNTER — Encounter: Payer: Self-pay | Admitting: Gynecologic Oncology

## 2022-02-21 ENCOUNTER — Other Ambulatory Visit: Payer: Self-pay

## 2022-02-21 DIAGNOSIS — G8918 Other acute postprocedural pain: Secondary | ICD-10-CM

## 2022-02-21 DIAGNOSIS — N838 Other noninflammatory disorders of ovary, fallopian tube and broad ligament: Secondary | ICD-10-CM

## 2022-02-21 MED ORDER — OXYCODONE HCL 5 MG PO TABS
5.0000 mg | ORAL_TABLET | ORAL | 0 refills | Status: DC | PRN
  Filled 2022-02-21: qty 5, 1d supply, fill #0

## 2022-02-21 NOTE — Progress Notes (Signed)
Gynecologic Oncology Telehealth Consult Note: Gyn-Onc  I connected with [name] on 02/21/22 at  4:00 PM EDT by telephone and verified that I am speaking with the correct person using two identifiers.  I discussed the limitations, risks, security and privacy concerns of performing an evaluation and management service by telemedicine and the availability of in-person appointments. I also discussed with the patient that there may be a patient responsible charge related to this service. The patient expressed understanding and agreed to proceed.  Other persons participating in the visit and their role in the encounter: none.  Patient's location: home Provider's location: [facility]  Reason for Visit: follow-up after surgery  Treatment History: 02/14/22: Robotic LSO, LOA, ureterolysis, oversewing of rectal serosa  Interval History: Still sore on the right. Constipation improving. Had bowel movement yesterday, using stool softener. +flatus.  Denies urinary symptoms. Tolerating PO intake, mild nausea today (first time since surgery), resolved.   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    Past Surgical History:  Procedure Laterality Date   ABDOMINAL HYSTERECTOMY     in 8s for heavy bleeding, RSO   IR ANGIO INTRA EXTRACRAN SEL INTERNAL CAROTID BILAT MOD SED  07/06/2020   IR ANGIO INTRA EXTRACRAN SEL INTERNAL CAROTID BILAT MOD SED  07/12/2020   IR ANGIO VERTEBRAL SEL VERTEBRAL BILAT MOD SED  07/06/2020   IR ANGIO VERTEBRAL SEL VERTEBRAL BILAT MOD SED  07/12/2020   RADIOLOGY WITH ANESTHESIA N/A 07/06/2020   Procedure: IR WITH ANESTHESIA;  Surgeon: Consuella Lose, MD;  Location: [facility];  Service: Radiology;  Laterality: N/A;   ROBOTIC ASSISTED BILATERAL SALPINGO  OOPHERECTOMY N/A 02/14/2022   Procedure: XI ROBOTIC ASSISTED UNILATERAL SALPINGO OOPHORECTOMY, LYSIS OF ADHESIONS, LEFT URETEROLYSIS;  Surgeon: Lafonda Mosses, MD;  Location: [facility];  Service: Gynecology;  Laterality: N/A;   TUBAL LIGATION     WRIST SURGERY Left    for carpal tunnel    Family History  Problem Relation Age of Onset   Ovarian cancer Neg Hx    Uterine cancer Neg Hx    Breast cancer Neg Hx     Social History   Socioeconomic History   Marital status: Single    Spouse name: Not on file   Number of children: Not on file   Years of education: Not on file   Highest education level: Not on file  Occupational History   Not on file  Tobacco Use   Smoking status: Every Day    Packs/day: 0.25    Types: Cigarettes   Smokeless tobacco: Never  Vaping Use   Vaping Use: Never used  Substance and Sexual Activity   Alcohol use: Yes    Comment: once a week   Drug use: Yes    Types: Marijuana    Comment: 3-4x week   Sexual activity: Yes    Birth control/protection: Surgical  Other Topics Concern   Not on file  Social History Narrative   Not on file   Social Determinants of Health   Financial Resource Strain: High Risk (02/07/2022)   Overall Financial Resource Strain (CARDIA)    Difficulty of Paying Living Expenses: Very hard  Food Insecurity: Not on file  Transportation Needs: Unmet Transportation Needs (02/03/2022)   [place] -  Hydrologist (Medical): Yes    Lack of Transportation (Non-Medical): Yes  Physical Activity: Not on file  Stress: Not on file  Social Connections: Not on file    Current Medications:  Current Outpatient Medications:    albuterol (VENTOLIN HFA) 108 (90 Base) MCG/ACT inhaler, Inhale 2 puffs into the lungs every 6 (six) hours as needed for wheezing or shortness of breath., Disp: 8.5 g, Rfl: 0   amLODipine (NORVASC) 10 MG tablet, Take 1 tablet (10 mg total) by mouth daily., Disp: 90 tablet, Rfl: 1   cetirizine  (ZYRTEC) 10 MG tablet, Take 1 tablet (10 mg total) by mouth daily. (Patient taking differently: Take 10 mg by mouth daily as needed for allergies.), Disp: 30 tablet, Rfl: 11   cyclobenzaprine (FLEXERIL) 10 MG tablet, Take 1/2 tablet (5 mg total) by mouth 3 (three) times daily as needed for muscle spasms., Disp: 40 tablet, Rfl: 0   dexamethasone (DECADRON) 1 MG tablet, Take 1 tab at 11 p.m night before coming to Cortisol level draw the following morning., Disp: 1 tablet, Rfl: 0   gabapentin (NEURONTIN) 300 MG capsule, Take 1 capsule (300 mg total) by mouth at bedtime., Disp: 90 capsule, Rfl: 0   meloxicam (MOBIC) 7.5 MG tablet, Take 1 tablet by mouth daily as needed for pain, Disp: 30 tablet, Rfl: 0   metoprolol tartrate (LOPRESSOR) 100 MG tablet, Take 1 tablet (100 mg total) by mouth 90-120 minutes prior to scan., Disp: 1 tablet, Rfl: 0   oxyCODONE (OXY IR/ROXICODONE) 5 MG immediate release tablet, Take 1 tablet (5 mg total) by mouth every 4 (four) hours as needed for severe pain. DO NOT TAKE AND DRIVE. DO NOT TAKE WITH TRAMADOL, Disp: 15 tablet, Rfl: 0   senna-docusate (SENOKOT-S) 8.6-50 MG tablet, Take 2 tablets by mouth at bedtime. For AFTER surgery, do not take if having diarrhea, Disp: 30 tablet, Rfl: 0  Review of Symptoms: Pertinent positives as per HPI.  Physical Exam: There were no vitals taken for this visit. Deferred given limitations of phone visit.  Laboratory & Radiologic Studies: . OVARY AND FALLOPIAN TUBE, LEFT, SALPINGO OOPHORECTOMY:  - Benign serous cystadenoma, 3.2 cm  - Portion of benign unremarkable fallopian tube  - Separate detached leiomyoma, 1.2 cm  - No evidence of malignancy  Assessment & Plan: Lisa Haley is a 46 y.o. woman s/p robotic USO for benign cyst, surgery complicated by significant pelvic adhesions.  Patient is doing well, improving. Still having some pain. Reports constipation, is only using stool softener. Encouraged her to add Miralax.  Discussed constipating effect of narcotics. Patient requesting refill of oxycodone. Asked her about 60 tramadol she was prescribed (not by my office) before surgery - says she doesn't know where this went. Said that at a week post-op, she should be using over the counter medications. Will send 5 '5mg'$  oxycodone to her pharmacy, but this will be the last narcotic refill.  Reviewed benign pathology; patient happy with this news.  She has started having some hot flashes. It looked like she might have some native right ovarian tissue in situ at the time of surgery. I've asked her to keep me posted about menopausal symptoms over the next week. If symptoms worsen, will discussed starting HRT.   I discussed the assessment and treatment plan with the patient. The patient was provided with an opportunity to ask questions and all were answered. The patient agreed with the plan and demonstrated an understanding of the instructions.  The patient was advised to call back or see an in-person evaluation if the symptoms worsen or if the condition fails to improve as anticipated.   12 minutes of total time was spent for this patient encounter, including preparation, phone counseling with the patient and coordination of care, and documentation of the encounter.   Jeral Pinch, MD  Division of Gynecologic Oncology  Department of Obstetrics and Gynecology  [facility]

## 2022-02-22 ENCOUNTER — Other Ambulatory Visit (HOSPITAL_BASED_OUTPATIENT_CLINIC_OR_DEPARTMENT_OTHER): Payer: Self-pay

## 2022-02-22 ENCOUNTER — Other Ambulatory Visit: Payer: Self-pay

## 2022-02-26 ENCOUNTER — Other Ambulatory Visit: Payer: Self-pay | Admitting: Gynecologic Oncology

## 2022-02-26 ENCOUNTER — Other Ambulatory Visit: Payer: Self-pay | Admitting: Interventional Cardiology

## 2022-02-26 DIAGNOSIS — G8918 Other acute postprocedural pain: Secondary | ICD-10-CM

## 2022-02-27 ENCOUNTER — Other Ambulatory Visit (HOSPITAL_COMMUNITY): Payer: Self-pay

## 2022-02-28 ENCOUNTER — Other Ambulatory Visit (HOSPITAL_COMMUNITY): Payer: Self-pay

## 2022-03-03 ENCOUNTER — Telehealth (HOSPITAL_COMMUNITY): Payer: Self-pay | Admitting: Emergency Medicine

## 2022-03-03 NOTE — Telephone Encounter (Signed)
Unable to leave vm Alton Tremblay RN Navigator Cardiac Imaging [facility] [phone] Office  [phone] Cell  

## 2022-03-04 ENCOUNTER — Other Ambulatory Visit (HOSPITAL_COMMUNITY): Payer: Self-pay

## 2022-03-06 ENCOUNTER — Ambulatory Visit (HOSPITAL_COMMUNITY): Admission: RE | Admit: 2022-03-06 | Payer: Commercial Managed Care - HMO | Source: Ambulatory Visit

## 2022-03-06 ENCOUNTER — Encounter (HOSPITAL_COMMUNITY): Payer: Self-pay | Admitting: Interventional Cardiology

## 2022-03-06 ENCOUNTER — Ambulatory Visit (HOSPITAL_COMMUNITY): Payer: Commercial Managed Care - HMO | Attending: Cardiology

## 2022-03-07 ENCOUNTER — Other Ambulatory Visit (HOSPITAL_COMMUNITY): Payer: Self-pay

## 2022-03-07 ENCOUNTER — Other Ambulatory Visit: Payer: Self-pay | Admitting: Interventional Cardiology

## 2022-03-08 ENCOUNTER — Encounter: Payer: Self-pay | Admitting: Gynecologic Oncology

## 2022-03-08 NOTE — Progress Notes (Unsigned)
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    Past Surgical History:  Procedure Laterality Date   ABDOMINAL HYSTERECTOMY     in 22s for heavy bleeding, RSO   IR ANGIO INTRA EXTRACRAN SEL INTERNAL CAROTID BILAT MOD SED  07/06/2020   IR ANGIO INTRA EXTRACRAN SEL INTERNAL CAROTID BILAT MOD SED  07/12/2020   IR ANGIO VERTEBRAL SEL VERTEBRAL BILAT MOD SED  07/06/2020   IR ANGIO VERTEBRAL SEL VERTEBRAL BILAT MOD SED  07/12/2020   RADIOLOGY WITH ANESTHESIA N/A 07/06/2020   Procedure: IR WITH ANESTHESIA;  Surgeon: Consuella Lose, MD;  Location: [facility];  Service: Radiology;  Laterality: N/A;   ROBOTIC ASSISTED BILATERAL SALPINGO OOPHERECTOMY N/A 02/14/2022   Procedure: XI ROBOTIC ASSISTED UNILATERAL SALPINGO OOPHORECTOMY, LYSIS OF ADHESIONS, LEFT URETEROLYSIS;  Surgeon: Lafonda Mosses, MD;  [facility];  Service: Gynecology;  Laterality: N/A;   TUBAL LIGATION     WRIST SURGERY Left    for carpal tunnel    Family History  Problem Relation Age of Onset   Ovarian cancer Neg Hx    Uterine cancer Neg Hx    Breast cancer Neg Hx     Social History   Socioeconomic History   Marital status: Single    Spouse name: Not on file   Number of children: Not on file   Years of education: Not on file   Highest education level: Not on file  Occupational History   Not on file  Tobacco Use   Smoking status: Every Day    Packs/day: 0.25    Types: Cigarettes   Smokeless tobacco: Never  Vaping Use   Vaping Use: Never used  Substance and Sexual Activity   Alcohol use:  Yes    Comment: once a week   Drug use: Yes    Types: Marijuana    Comment: 3-4x week   Sexual activity: Yes    Birth control/protection: Surgical  Other Topics Concern   Not on file  Social History Narrative   Not on file   Social Determinants of Health   Financial Resource Strain: High Risk (02/07/2022)   Overall Financial Resource Strain (CARDIA)    Difficulty of Paying Living Expenses: Very hard  Food Insecurity: Not on file  Transportation Needs: Unmet Transportation Needs (02/03/2022)   [facility] - Hydrologist (Medical): Yes    Lack of Transportation (Non-Medical): Yes  Physical Activity: Not on file  Stress: Not on file  Social Connections: Not on file    Current Medications:  Current Outpatient Medications:    albuterol (VENTOLIN HFA) 108 (90 Base) MCG/ACT inhaler, Inhale 2 puffs into the lungs every 6 (six) hours as needed for wheezing or shortness of breath., Disp: 8.5 g, Rfl: 0   amLODipine (NORVASC) 10 MG tablet, Take 1 tablet (10 mg total) by mouth daily., Disp: 90 tablet, Rfl: 1   cetirizine (ZYRTEC) 10 MG tablet, Take 1 tablet (10 mg total) by mouth daily. (Patient taking differently: Take 10 mg by mouth daily as needed for allergies.), Disp: 30  tablet, Rfl: 11   cyclobenzaprine (FLEXERIL) 10 MG tablet, Take 1/2 tablet (5 mg total) by mouth 3 (three) times daily as needed for muscle spasms., Disp: 40 tablet, Rfl: 0   dexamethasone (DECADRON) 1 MG tablet, Take 1 tab at 11 p.m night before coming to Cortisol level draw the following morning., Disp: 1 tablet, Rfl: 0   gabapentin (NEURONTIN) 300 MG capsule, Take 1 capsule (300 mg total) by mouth at bedtime., Disp: 90 capsule, Rfl: 0   meloxicam (MOBIC) 7.5 MG tablet, Take 1 tablet by mouth daily as needed for pain, Disp: 30 tablet, Rfl: 0   metoprolol tartrate (LOPRESSOR) 100 MG tablet, Take 1 tablet (100 mg total) by mouth 90-120 minutes prior to scan., Disp: 1 tablet, Rfl: 0   oxyCODONE (OXY  IR/ROXICODONE) 5 MG immediate release tablet, Take 1 tablet (5 mg total) by mouth every 4 (four) hours as needed for severe pain., Disp: 5 tablet, Rfl: 0   senna-docusate (SENOKOT-S) 8.6-50 MG tablet, Take 2 tablets by mouth at bedtime. For AFTER surgery, do not take if having diarrhea, Disp: 30 tablet, Rfl: 0  Review of Systems: Denies appetite changes, fevers, chills, fatigue, unexplained weight changes. Denies hearing loss, neck lumps or masses, mouth sores, ringing in ears or voice changes. Denies cough or wheezing.  Denies shortness of breath. Denies chest pain or palpitations. Denies leg swelling. Denies abdominal distention, pain, blood in stools, constipation, diarrhea, nausea, vomiting, or early satiety. Denies pain with intercourse, dysuria, frequency, hematuria or incontinence. Denies hot flashes, pelvic pain, vaginal bleeding or vaginal discharge.   Denies joint pain, back pain or muscle pain/cramps. Denies itching, rash, or wounds. Denies dizziness, headaches, numbness or seizures. Denies swollen lymph nodes or glands, denies easy bruising or bleeding. Denies anxiety, depression, confusion, or decreased concentration.  Physical Exam: There were no vitals taken for this visit. General: ***Alert, oriented, no acute distress. HEENT: ***Posterior oropharynx clear, sclera anicteric. Chest: ***Clear to auscultation bilaterally.  ***Port site clean. Cardiovascular: ***Regular rate and rhythm, no murmurs. Abdomen: ***Obese, soft, nontender.  Normoactive bowel sounds.  No masses or hepatosplenomegaly appreciated.  ***Well-healed scar. Extremities: ***Grossly normal range of motion.  Warm, well perfused.  No edema bilaterally. Skin: ***No rashes or lesions noted. Lymphatics: ***No cervical, supraclavicular, or inguinal adenopathy. GU: Normal appearing external genitalia without erythema, excoriation, or lesions.  Speculum exam reveals ***.  Bimanual exam reveals ***.  ***Rectovaginal  exam  confirms ___.  Laboratory & Radiologic Studies: OVARY AND FALLOPIAN TUBE, LEFT, SALPINGO OOPHORECTOMY:  - Benign serous cystadenoma, 3.2 cm  - Portion of benign unremarkable fallopian tube  - Separate detached leiomyoma, 1.2 cm  - No evidence of malignancy  Assessment & Plan: Lisa Haley is a 46 y.o. woman s/p robotic USO for benign cyst, surgery complicated by significant pelvic adhesions.   ***   Reviewed benign pathology; patient happy with this news.   She has started having some hot flashes. It looked like she might have some native right ovarian tissue in situ at the time of surgery. I've asked her to keep me posted about menopausal symptoms over the next week. If symptoms worsen, will discussed starting HRT.   *** minutes of total time was spent for this patient encounter, including preparation, face-to-face counseling with the patient and coordination of care, and documentation of the encounter.  Jeral Pinch, MD  Division of Gynecologic Oncology  Department of Obstetrics and Gynecology  [facility]

## 2022-03-09 ENCOUNTER — Telehealth: Payer: Self-pay

## 2022-03-09 ENCOUNTER — Ambulatory Visit: Payer: Commercial Managed Care - HMO | Admitting: Gynecologic Oncology

## 2022-03-09 NOTE — Telephone Encounter (Signed)
Pt called office to reschedule appointment for today. She states she has a funeral to go to . Appointment rescheduled for 03/15/22 @ 1:00.  Pt agreed to date and time

## 2022-03-10 ENCOUNTER — Other Ambulatory Visit: Payer: Self-pay | Admitting: Gynecologic Oncology

## 2022-03-10 DIAGNOSIS — G8918 Other acute postprocedural pain: Secondary | ICD-10-CM

## 2022-03-13 ENCOUNTER — Other Ambulatory Visit: Payer: Self-pay | Admitting: Internal Medicine

## 2022-03-13 DIAGNOSIS — G629 Polyneuropathy, unspecified: Secondary | ICD-10-CM

## 2022-03-13 DIAGNOSIS — M62838 Other muscle spasm: Secondary | ICD-10-CM

## 2022-03-13 DIAGNOSIS — R1030 Lower abdominal pain, unspecified: Secondary | ICD-10-CM

## 2022-03-15 ENCOUNTER — Inpatient Hospital Stay: Payer: Commercial Managed Care - HMO

## 2022-03-15 ENCOUNTER — Other Ambulatory Visit: Payer: Self-pay

## 2022-03-15 ENCOUNTER — Other Ambulatory Visit (HOSPITAL_COMMUNITY): Payer: Self-pay

## 2022-03-15 ENCOUNTER — Inpatient Hospital Stay: Payer: Commercial Managed Care - HMO | Attending: Gynecologic Oncology | Admitting: Obstetrics & Gynecology

## 2022-03-15 ENCOUNTER — Other Ambulatory Visit: Payer: Self-pay | Admitting: Internal Medicine

## 2022-03-15 VITALS — BP 136/96 | HR 93 | Temp 98.1°F | Resp 18 | Ht 62.01 in | Wt 245.0 lb

## 2022-03-15 DIAGNOSIS — D271 Benign neoplasm of left ovary: Secondary | ICD-10-CM

## 2022-03-15 DIAGNOSIS — N838 Other noninflammatory disorders of ovary, fallopian tube and broad ligament: Secondary | ICD-10-CM

## 2022-03-15 DIAGNOSIS — Z90721 Acquired absence of ovaries, unilateral: Secondary | ICD-10-CM

## 2022-03-15 DIAGNOSIS — G629 Polyneuropathy, unspecified: Secondary | ICD-10-CM

## 2022-03-15 DIAGNOSIS — R1030 Lower abdominal pain, unspecified: Secondary | ICD-10-CM

## 2022-03-15 DIAGNOSIS — M62838 Other muscle spasm: Secondary | ICD-10-CM

## 2022-03-15 DIAGNOSIS — Z09 Encounter for follow-up examination after completed treatment for conditions other than malignant neoplasm: Secondary | ICD-10-CM

## 2022-03-15 MED ORDER — MELOXICAM 7.5 MG PO TABS
7.5000 mg | ORAL_TABLET | Freq: Every day | ORAL | 0 refills | Status: DC
  Filled 2022-03-15: qty 30, 30d supply, fill #0

## 2022-03-15 MED ORDER — CYCLOBENZAPRINE HCL 10 MG PO TABS
5.0000 mg | ORAL_TABLET | Freq: Three times a day (TID) | ORAL | 0 refills | Status: DC | PRN
  Filled 2022-03-15: qty 40, 27d supply, fill #0

## 2022-03-15 NOTE — Progress Notes (Signed)
Gynecologic Oncology Return Clinic Visit  03/15/22  Reason for Visit: follow-up after surgery  Treatment History: 02/14/22: Robotic LSO, LOA, ureterolysis, oversewing of rectal serosa  Interval History: Initially had pain that required extended narcotic use for control.  C/O mild incisional discomfort--left infracostal incision.  No bulge, N/V.    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    Past Surgical History:  Procedure Laterality Date   ABDOMINAL HYSTERECTOMY     in 29s for heavy bleeding, RSO   IR ANGIO INTRA EXTRACRAN SEL INTERNAL CAROTID BILAT MOD SED  07/06/2020   IR ANGIO INTRA EXTRACRAN SEL INTERNAL CAROTID BILAT MOD SED  07/12/2020   IR ANGIO VERTEBRAL SEL VERTEBRAL BILAT MOD SED  07/06/2020   IR ANGIO VERTEBRAL SEL VERTEBRAL BILAT MOD SED  07/12/2020   RADIOLOGY WITH ANESTHESIA N/A 07/06/2020   Procedure: IR WITH ANESTHESIA;  Surgeon: Consuella Lose, MD;  Location: [facility];  Service: Radiology;  Laterality: N/A;   ROBOTIC ASSISTED BILATERAL SALPINGO OOPHERECTOMY N/A 02/14/2022   Procedure: XI ROBOTIC ASSISTED UNILATERAL SALPINGO OOPHORECTOMY, LYSIS OF ADHESIONS, LEFT URETEROLYSIS;  Surgeon: Lafonda Mosses, MD;  [facility];  Service: Gynecology;  Laterality: N/A;   TUBAL LIGATION     WRIST SURGERY Left    for carpal tunnel    Family History  Problem Relation Age of Onset   Ovarian cancer Neg Hx    Uterine cancer Neg Hx    Breast cancer Neg Hx     Social History   Socioeconomic History   Marital status: Single    Spouse name: Not on file   Number of children: Not on file   Years of education: Not on file   Highest education level: Not on file  Occupational History   Not on file  Tobacco Use   Smoking status: Every Day    Packs/day:  0.25    Types: Cigarettes   Smokeless tobacco: Never  Vaping Use   Vaping Use: Never used  Substance and Sexual Activity   Alcohol use: Yes    Comment: once a week   Drug use: Yes    Types: Marijuana    Comment: 3-4x week   Sexual activity: Yes    Birth control/protection: Surgical  Other Topics Concern   Not on file  Social History Narrative   Not on file   Social Determinants of Health   Financial Resource Strain: High Risk (02/07/2022)   Overall Financial Resource Strain (CARDIA)    Difficulty of Paying Living Expenses: Very hard  Food Insecurity: Not on file  Transportation Needs: Unmet Transportation Needs (03/14/2022)   [facility] - Hydrologist (Medical): Yes    Lack of Transportation (Non-Medical): Yes  Physical Activity: Not on file  Stress: Not on file  Social Connections: Not on file    Current Medications:  Current Outpatient Medications:    albuterol (VENTOLIN HFA) 108 (90 Base) MCG/ACT inhaler, Inhale 2 puffs into the lungs every 6 (six) hours as needed for wheezing or shortness of breath., Disp: 8.5 g, Rfl: 0   amLODipine (NORVASC) 10 MG tablet, Take 1 tablet (10 mg total) by mouth daily., Disp: 90 tablet, Rfl: 1   cetirizine (ZYRTEC) 10 MG tablet, Take 1  tablet (10 mg total) by mouth daily. (Patient taking differently: Take 10 mg by mouth daily as needed for allergies.), Disp: 30 tablet, Rfl: 11   cyclobenzaprine (FLEXERIL) 10 MG tablet, Take 1/2 tablet (5 mg total) by mouth 3 (three) times daily as needed for muscle spasms., Disp: 40 tablet, Rfl: 0   dexamethasone (DECADRON) 1 MG tablet, Take 1 tab at 11 p.m night before coming to Cortisol level draw the following morning., Disp: 1 tablet, Rfl: 0   gabapentin (NEURONTIN) 300 MG capsule, Take 1 capsule (300 mg total) by mouth at bedtime., Disp: 90 capsule, Rfl: 0   meloxicam (MOBIC) 7.5 MG tablet, Take 1 tablet by mouth daily as needed for pain, Disp: 30 tablet, Rfl: 0   senna-docusate  (SENOKOT-S) 8.6-50 MG tablet, Take 2 tablets by mouth at bedtime. For AFTER surgery, do not take if having diarrhea, Disp: 30 tablet, Rfl: 0   oxyCODONE (OXY IR/ROXICODONE) 5 MG immediate release tablet, Take 1 tablet (5 mg total) by mouth every 4 (four) hours as needed for severe pain., Disp: 5 tablet, Rfl: 0  Review of Systems: Denies appetite changes, fevers, chills, fatigue, unexplained weight changes. Denies hearing loss, neck lumps or masses, mouth sores, ringing in ears or voice changes. Denies cough or wheezing.  Denies shortness of breath. Denies chest pain or palpitations. Denies leg swelling. Denies abdominal distention, pain, blood in stools, constipation, diarrhea, nausea, vomiting, or early satiety. Denies pain with intercourse, dysuria, frequency, hematuria or incontinence. Denies hot flashes, pelvic pain, vaginal bleeding or vaginal discharge.   Denies joint pain, back pain or muscle pain/cramps. Denies itching, rash, or wounds. Denies dizziness, headaches, numbness or seizures. Denies swollen lymph nodes or glands, denies easy bruising or bleeding. Denies anxiety, depression, confusion, or decreased concentration.  Physical Exam: BP (!) 136/96 (BP Location: Left Arm, Patient Position: Sitting)   Pulse 93   Temp 98.1 F (36.7 C) (Oral)   Resp 18   Ht 5' 2.01" (1.575 m)   Wt 245 lb (111.1 kg)   SpO2 100%   BMI 44.80 kg/m   General: Alert, oriented, no acute distress. Abdomen: Obese, soft, nontender.  No masses or hepatosplenomegaly appreciated.  Well-healed scar.   Laboratory & Radiologic Studies: OVARY AND FALLOPIAN TUBE, LEFT, SALPINGO OOPHORECTOMY:  - Benign serous cystadenoma, 3.2 cm  - Portion of benign unremarkable fallopian tube  - Separate detached leiomyoma, 1.2 cm  - No evidence of malignancy  Assessment & Plan: Lisa Haley is a 46 y.o. woman s/p robotic USO for benign cyst, surgery complicated by significant pelvic adhesions. Doing well  postoperatively   >return prn; referral placed to establish care w/a gynecologic generalist in the community   Lahoma Crocker, MD  Division of Gynecologic Oncology  Department of Obstetrics and Gynecology  [facility]

## 2022-03-15 NOTE — Patient Instructions (Signed)
Return prn 

## 2022-03-16 ENCOUNTER — Other Ambulatory Visit (HOSPITAL_COMMUNITY): Payer: Self-pay

## 2022-03-16 ENCOUNTER — Encounter: Payer: Self-pay | Admitting: Obstetrics & Gynecology

## 2022-03-17 ENCOUNTER — Encounter (HOSPITAL_COMMUNITY): Payer: Self-pay | Admitting: Pharmacist

## 2022-03-17 ENCOUNTER — Other Ambulatory Visit (HOSPITAL_COMMUNITY): Payer: Self-pay

## 2022-03-22 ENCOUNTER — Telehealth: Payer: Self-pay

## 2022-03-22 NOTE — Telephone Encounter (Signed)
I spoke to Courtney/ [facility] [place] and she confirmed that the patient has been certified for transportation

## 2022-03-25 ENCOUNTER — Other Ambulatory Visit (HOSPITAL_COMMUNITY): Payer: Self-pay

## 2022-03-28 ENCOUNTER — Encounter: Payer: Self-pay | Admitting: Obstetrics & Gynecology

## 2022-03-29 ENCOUNTER — Telehealth: Payer: Self-pay | Admitting: Emergency Medicine

## 2022-03-29 ENCOUNTER — Telehealth (HOSPITAL_COMMUNITY): Payer: Self-pay | Admitting: *Deleted

## 2022-03-29 DIAGNOSIS — N9489 Other specified conditions associated with female genital organs and menstrual cycle: Secondary | ICD-10-CM

## 2022-03-29 NOTE — Telephone Encounter (Signed)
Copied from [place] [id number]. Topic: Referral - Status >> Mar 29, 2022  1:45 PM Ja-Kwan M wrote: Reason for CRM: Pt stated she does not have transportation to [place] so she will need a referral to a OBGYN office in [place]. Pt stated she needs this asap because she is still in pain after her surgery. Cb# [phone]

## 2022-03-29 NOTE — Telephone Encounter (Signed)
Attempted to call patient regarding upcoming cardiac CT appointment. Unable to leave a message.  Aidyn Sportsman RN Navigator Cardiac Imaging [facility] [phone] Office [phone] Cell  

## 2022-03-30 ENCOUNTER — Ambulatory Visit (HOSPITAL_COMMUNITY): Admission: RE | Admit: 2022-03-30 | Payer: Commercial Managed Care - HMO | Source: Ambulatory Visit

## 2022-03-30 ENCOUNTER — Ambulatory Visit: Payer: Self-pay | Admitting: *Deleted

## 2022-03-30 ENCOUNTER — Telehealth: Payer: Self-pay | Admitting: Emergency Medicine

## 2022-03-30 NOTE — Telephone Encounter (Signed)
  Chief Complaint: Abdominal pain Symptoms: Left lower side "Where ovary is" Had ovarian mass, surgery 02/14/22, "Same pain same place."  Has to hold onto wall to walk in mornings, eases some during day but still 8/10 Frequency: 2 weeks worsening to 8-10/10 Pertinent Negatives: Patient denies  Disposition: '[x]'$ ED /'[]'$ Urgent Care (no appt availability in office) / '[]'$ Appointment(In office/virtual)/ '[]'$  [facility]/ '[]'$ Home Care/ '[]'$ Refused Recommended Disposition /'[]'$ [place]/ '[]'$  Follow-up with PCP Additional Notes: Advised ED, states will follow disposition. Reason for Disposition  [1] SEVERE pain (e.g., excruciating) AND [2] present > 1 hour  Answer Assessment - Initial Assessment Questions 1. LOCATION: "Where does it hurt?"      Lower left side 2. RADIATION: "Does the pain shoot anywhere else?" (e.g., chest, back)      3. ONSET: "When did the pain begin?" (e.g., minutes, hours or days ago)      2 weeks ago, worsening 4. SUDDEN: "Gradual or sudden onset?"      5. PATTERN "Does the pain come and go, or is it constant?"    - If it comes and goes: "How long does it last?" "Do you have pain now?"     (Note: Comes and goes means the pain is intermittent. It goes away completely between bouts.)    - If constant: "Is it getting better, staying the same, or getting worse?"      (Note: Constant means the pain never goes away completely; most serious pain is constant and gets worse.)      Constant, varies in severity 6. SEVERITY: "How bad is the pain?"  (e.g., Scale 1-10; mild, moderate, or severe)    - MILD (1-3): Doesn't interfere with normal activities, abdomen soft and not tender to touch.     - MODERATE (4-7): Interferes with normal activities or awakens from sleep, abdomen tender to touch.     - SEVERE (8-10): Excruciating pain, doubled over, unable to do any normal activities.       8-10/10 7. RECURRENT SYMPTOM: "Have you ever had this type of stomach pain before?" If  Yes, ask: "When was the last time?" and "What happened that time?"      Yes 02/14/22 8. CAUSE: "What do you think is causing the stomach pain?"     Ovarian cyst  Protocols used: Abdominal Pain - Female-A-AH

## 2022-03-30 NOTE — Telephone Encounter (Signed)
Copied from [place] [id number]. Topic: Referral - Request for Referral >> Mar 30, 2022 10:58 AM Everette C wrote: Has patient seen PCP for this complaint? Yes.   *If NO, is insurance requiring patient see PCP for this issue before PCP can refer them? Referral for which specialty: OBGYN  Preferred provider/office: The patient would like for them to be located in [place]  Reason for referral: post surgical follow up

## 2022-03-30 NOTE — Telephone Encounter (Signed)
Noted  

## 2022-04-11 NOTE — Addendum Note (Signed)
Addended by: Karle Plumber B on: 04/11/2022 06:01 PM   Modules accepted: Orders

## 2022-04-11 NOTE — Telephone Encounter (Signed)
Please advise.------DD,RMA

## 2022-04-12 NOTE — Telephone Encounter (Signed)
Pt informed that urgent referral was sent to [facility] for GYN appt. [facility] also [facility]. Pt expressed understanding. Sent to pcp as FYI.-----DD,RMA

## 2022-04-12 NOTE — Telephone Encounter (Signed)
Please advise.----DD,RMA

## 2022-04-13 ENCOUNTER — Telehealth: Payer: Self-pay | Admitting: Emergency Medicine

## 2022-04-13 ENCOUNTER — Ambulatory Visit: Payer: Self-pay | Admitting: *Deleted

## 2022-04-13 ENCOUNTER — Other Ambulatory Visit: Payer: Self-pay | Admitting: Internal Medicine

## 2022-04-13 ENCOUNTER — Other Ambulatory Visit (HOSPITAL_COMMUNITY): Payer: Self-pay

## 2022-04-13 DIAGNOSIS — G629 Polyneuropathy, unspecified: Secondary | ICD-10-CM

## 2022-04-13 DIAGNOSIS — R1030 Lower abdominal pain, unspecified: Secondary | ICD-10-CM

## 2022-04-13 DIAGNOSIS — M62838 Other muscle spasm: Secondary | ICD-10-CM

## 2022-04-13 MED ORDER — CYCLOBENZAPRINE HCL 10 MG PO TABS
5.0000 mg | ORAL_TABLET | Freq: Three times a day (TID) | ORAL | 0 refills | Status: DC | PRN
  Filled 2022-04-13: qty 40, 27d supply, fill #0

## 2022-04-13 MED ORDER — MELOXICAM 7.5 MG PO TABS
7.5000 mg | ORAL_TABLET | Freq: Every day | ORAL | 0 refills | Status: DC
  Filled 2022-04-13: qty 30, 30d supply, fill #0

## 2022-04-13 NOTE — Telephone Encounter (Signed)
Pt informed of pcp notes below. Pt expressed understanding.--DD,RMA

## 2022-04-13 NOTE — Telephone Encounter (Signed)
FYI/UPDATE: Disregard last advise request per your last note dated 10/05, pt was contacted via your instruction to keep upcoming appt w/ you & GYN. Pt voiced understanding to instructions given per pcp.

## 2022-04-13 NOTE — Telephone Encounter (Signed)
Copied from [place] [phone]. Topic: Appointment Scheduling - Scheduling Inquiry for Clinic >> Apr 13, 2022  9:11 AM Tiffany B wrote: Reason for CRM: Patient experiencing abdominal pain (patient transferred to [facility] NT). Patient would like to be seen sooner then 11/3 by PCP. Patient was notified she has a mass and will need surgery patient was not able to provide me with any additional information. Also patient would like PCP to put her on disability.

## 2022-04-13 NOTE — Telephone Encounter (Addendum)
  Chief Complaint: Lower left abdominal pain Symptoms: "I have a mass that is not cancer but as told I need it taken out." Abdominal pain, worse with movement, left lower abdomina below umbilicus x 1 month "After first surgery OK for a while, then started again.". Frequency: 1 month Pertinent Negatives: Patient denies any other symptoms. Disposition: '[]'$ ED /'[]'$ Urgent Care (no appt availability in office) / '[]'$ Appointment(In office/virtual)/ '[]'$  [place] [facility]/ '[]'$ Home Care/ '[]'$ Refused Recommended Disposition /'[]'$ [place] [facility]/ '[x]'$  Follow-up with PCP Additional Notes: While spaeking to pt OB/GYN office called pt, secured first available in Jan. States she is calling another OB/GYN to see if she can get in earlier. Assured pt NT would route to practice for PCPs review. Advised to [facility] for worsening symptoms. Pt verbalizes understanding.  Answer Assessment - Initial Assessment Questions 1. LOCATION: "Where does it hurt?"      Left lower quad, below navel 2. RADIATION: "Does the pain shoot anywhere else?" (e.g., chest, back)     No 3. ONSET: "When did the pain begin?" (e.g., minutes, hours or days ago)      03/17/22 4. SUDDEN: "Gradual or sudden onset?"      5. PATTERN "Does the pain come and go, or is it constant?"    - If it comes and goes: "How long does it last?" "Do you have pain now?"     (Note: Comes and goes means the pain is intermittent. It goes away completely between bouts.)    - If constant: "Is it getting better, staying the same, or getting worse?"      (Note: Constant means the pain never goes away completely; most serious pain is constant and gets worse.)      Comes and goes 6. SEVERITY: "How bad is the pain?"  (e.g., Scale 1-10; mild, moderate, or severe)    - MILD (1-3): Doesn't interfere with normal activities, abdomen soft and not tender to touch.     - MODERATE (4-7): Interferes with normal activities or awakens from sleep, abdomen tender to touch.     - SEVERE  (8-10): Excruciating pain, doubled over, unable to do any normal activities.       6/10 lying down 7. RECURRENT SYMPTOM: "Have you ever had this type of stomach pain before?" If Yes, ask: "When was the last time?" and "What happened that time?"      Yes 8. CAUSE: "What do you think is causing the stomach pain?"     "Mass" 9. RELIEVING/AGGRAVATING FACTORS: "What makes it better or worse?" (e.g., antacids, bending or twisting motion, bowel movement)     Movement 10. OTHER SYMPTOMS: "Do you have any other symptoms?" (e.g., back pain, diarrhea, fever, urination pain, vomiting)  Protocols used: Abdominal Pain - Female-A-AH

## 2022-04-13 NOTE — Telephone Encounter (Signed)
Please advise.----DD,RMA

## 2022-04-13 NOTE — Telephone Encounter (Signed)
See last note dated 10/04 per messages and notes from pcp via 10/03 in response to referral for surgery per mass. Pt was informed yesterday of notes & that pcp sent urgent referral (via/per Alinda Sierras) to [facility] to be scheduled for appt. At the time of call pt expressed understanding. However, pt RTC call (see message below) requesting appt w/ pcp instead. Please advise. ---DD,RMA

## 2022-04-13 NOTE — Telephone Encounter (Signed)
Requested medication (s) are due for refill today - yes  Requested medication (s) are on the active medication list -yes  Future visit scheduled -yes  Last refill: 03/15/22 #40  Notes to clinic: non delegated Rx  Requested Prescriptions  Pending Prescriptions Disp Refills   cyclobenzaprine (FLEXERIL) 10 MG tablet 40 tablet 0    Sig: Take 1/2 tablet (5 mg total) by mouth 3 (three) times daily as needed for muscle spasms.     Not Delegated - Analgesics:  Muscle Relaxants Failed - 04/13/2022  8:24 AM      Failed - This refill cannot be delegated      Passed - Valid encounter within last 6 months    Recent Outpatient Visits           2 months ago Pre-operative clearance   [place] Ladell Pier, MD   5 months ago Lower abdominal pain   [place], Levada Dy M, [place]   11 months ago Neuropathy   [place], [place]   1 year ago Left hip pain   [place], [place], [place]   1 year ago Need for influenza vaccination   [place], RPH-CPP       Future Appointments             In 4 weeks Ladell Pier, MD [place]            Signed Prescriptions Disp Refills   meloxicam (MOBIC) 7.5 MG tablet 30 tablet 0    Sig: Take 1 tablet by mouth daily as needed for pain     Analgesics:  COX2 Inhibitors Failed - 04/13/2022  8:24 AM      Failed - Manual Review: Labs are only required if the patient has taken medication for more than 8 weeks.      Passed - HGB in normal range and within 360 days    Hemoglobin  Date Value Ref Range Status  02/02/2022 14.1 12.0 - 15.0 g/dL Final  11/09/2021 13.5 11.1 - 15.9 g/dL Final         Passed - Cr in normal range and within 360 days    Creatinine, Ser  Date Value Ref Range Status  02/14/2022 0.95 0.44  - 1.00 mg/dL Final         Passed - HCT in normal range and within 360 days    HCT  Date Value Ref Range Status  02/02/2022 42.5 36.0 - 46.0 % Final   Hematocrit  Date Value Ref Range Status  11/09/2021 38.5 34.0 - 46.6 % Final         Passed - AST in normal range and within 360 days    AST  Date Value Ref Range Status  02/02/2022 15 15 - 41 U/L Final         Passed - ALT in normal range and within 360 days    ALT  Date Value Ref Range Status  02/02/2022 18 0 - 44 U/L Final         Passed - eGFR is 30 or above and within 360 days    GFR calc Af Amer  Date Value Ref Range Status  08/08/2016 >60 >60 mL/min Final    Comment:    (NOTE) The eGFR has been calculated using the CKD  EPI equation. This calculation has not been validated in all clinical situations. eGFR's persistently <60 mL/min signify possible Chronic Kidney Disease.    GFR, Estimated  Date Value Ref Range Status  02/14/2022 >60 >60 mL/min Final    Comment:    (NOTE) Calculated using the CKD-EPI Creatinine Equation (2021)    eGFR  Date Value Ref Range Status  02/06/2022 71 >59 mL/min/1.73 Final         Passed - Patient is not pregnant      Passed - Valid encounter within last 12 months    Recent Outpatient Visits           2 months ago Pre-operative clearance   [place] Ladell Pier, MD   5 months ago Lower abdominal pain   [place], Levada Dy M, [place]   11 months ago Neuropathy   [place], [place]   1 year ago Left hip pain   [place], [place], [place]   1 year ago Need for influenza vaccination   [place], RPH-CPP       Future Appointments             In 4 weeks Ladell Pier, MD [place]               Requested Prescriptions   Pending Prescriptions Disp Refills   cyclobenzaprine (FLEXERIL) 10 MG tablet 40 tablet 0    Sig: Take 1/2 tablet (5 mg total) by mouth 3 (three) times daily as needed for muscle spasms.     Not Delegated - Analgesics:  Muscle Relaxants Failed - 04/13/2022  8:24 AM      Failed - This refill cannot be delegated      Passed - Valid encounter within last 6 months    Recent Outpatient Visits           2 months ago Pre-operative clearance   [place] Ladell Pier, MD   5 months ago Lower abdominal pain   [place], Levada Dy [place], [place]   11 months ago Neuropathy   [place], [place]   1 year ago Left hip pain   [place], [place], [place]   1 year ago Need for influenza vaccination   [place], RPH-CPP       Future Appointments             In 4 weeks Ladell Pier, MD [place]            Signed Prescriptions Disp Refills   meloxicam (MOBIC) 7.5 MG tablet 30 tablet 0    Sig: Take 1 tablet by mouth daily as needed for pain     Analgesics:  COX2 Inhibitors Failed - 04/13/2022  8:24 AM      Failed - Manual Review: Labs are only required if the patient has taken medication for more than 8 weeks.      Passed - HGB in normal range and within 360 days    Hemoglobin  Date Value Ref Range Status  02/02/2022 14.1 12.0 - 15.0 g/dL Final  11/09/2021 13.5 11.1 - 15.9 g/dL  Final         Passed - Cr in normal range and within 360 days    Creatinine, Ser  Date Value Ref Range Status  02/14/2022 0.95 0.44 - 1.00 mg/dL Final         Passed - HCT in normal range and within 360 days    HCT  Date Value Ref Range Status  02/02/2022 42.5 36.0 - 46.0 % Final   Hematocrit  Date Value Ref Range Status  11/09/2021 38.5 34.0 - 46.6 % Final          Passed - AST in normal range and within 360 days    AST  Date Value Ref Range Status  02/02/2022 15 15 - 41 U/L Final         Passed - ALT in normal range and within 360 days    ALT  Date Value Ref Range Status  02/02/2022 18 0 - 44 U/L Final         Passed - eGFR is 30 or above and within 360 days    GFR calc Af Amer  Date Value Ref Range Status  08/08/2016 >60 >60 mL/min Final    Comment:    (NOTE) The eGFR has been calculated using the CKD EPI equation. This calculation has not been validated in all clinical situations. eGFR's persistently <60 mL/min signify possible Chronic Kidney Disease.    GFR, Estimated  Date Value Ref Range Status  02/14/2022 >60 >60 mL/min Final    Comment:    (NOTE) Calculated using the CKD-EPI Creatinine Equation (2021)    eGFR  Date Value Ref Range Status  02/06/2022 71 >59 mL/min/1.73 Final         Passed - Patient is not pregnant      Passed - Valid encounter within last 12 months    Recent Outpatient Visits           2 months ago Pre-operative clearance   [place], MD   5 months ago Lower abdominal pain   [place], [place], [place]   11 months ago Neuropathy   [place], [place]   1 year ago Left hip pain   [place], [place], [place]   1 year ago Need for influenza vaccination   [place], RPH-CPP       Future Appointments             In 4 weeks Ladell Pier, MD [place]

## 2022-04-13 NOTE — Telephone Encounter (Signed)
Requested Prescriptions  Pending Prescriptions Disp Refills  . meloxicam (MOBIC) 7.5 MG tablet 30 tablet 0    Sig: Take 1 tablet by mouth daily as needed for pain     Analgesics:  COX2 Inhibitors Failed - 04/13/2022  8:24 AM      Failed - Manual Review: Labs are only required if the patient has taken medication for more than 8 weeks.      Passed - HGB in normal range and within 360 days    Hemoglobin  Date Value Ref Range Status  02/02/2022 14.1 12.0 - 15.0 g/dL Final  11/09/2021 13.5 11.1 - 15.9 g/dL Final         Passed - Cr in normal range and within 360 days    Creatinine, Ser  Date Value Ref Range Status  02/14/2022 0.95 0.44 - 1.00 mg/dL Final         Passed - HCT in normal range and within 360 days    HCT  Date Value Ref Range Status  02/02/2022 42.5 36.0 - 46.0 % Final   Hematocrit  Date Value Ref Range Status  11/09/2021 38.5 34.0 - 46.6 % Final         Passed - AST in normal range and within 360 days    AST  Date Value Ref Range Status  02/02/2022 15 15 - 41 U/L Final         Passed - ALT in normal range and within 360 days    ALT  Date Value Ref Range Status  02/02/2022 18 0 - 44 U/L Final         Passed - eGFR is 30 or above and within 360 days    GFR calc Af Amer  Date Value Ref Range Status  08/08/2016 >60 >60 mL/min Final    Comment:    (NOTE) The eGFR has been calculated using the CKD EPI equation. This calculation has not been validated in all clinical situations. eGFR's persistently <60 mL/min signify possible Chronic Kidney Disease.    GFR, Estimated  Date Value Ref Range Status  02/14/2022 >60 >60 mL/min Final    Comment:    (NOTE) Calculated using the CKD-EPI Creatinine Equation (2021)    eGFR  Date Value Ref Range Status  02/06/2022 71 >59 mL/min/1.73 Final         Passed - Patient is not pregnant      Passed - Valid encounter within last 12 months    Recent Outpatient Visits          2 months ago Pre-operative clearance    [place], [place]   5 months ago Lower abdominal pain   [place], Levada Dy [place], [place]   11 months ago Neuropathy   [place], [place]   1 year ago Left hip pain   [place], [place], [place]   1 year ago Need for influenza vaccination   [place], RPH-CPP      Future Appointments            In 4 weeks Ladell Pier, MD [place]           . cyclobenzaprine (FLEXERIL) 10 MG tablet 40 tablet 0    Sig: Take 1/2 tablet (5 mg total) by mouth 3 (three) times  daily as needed for muscle spasms.     Not Delegated - Analgesics:  Muscle Relaxants Failed - 04/13/2022  8:24 AM      Failed - This refill cannot be delegated      Passed - Valid encounter within last 6 months    Recent Outpatient Visits          2 months ago Pre-operative clearance   [place], MD   5 months ago Lower abdominal pain   [place], [place]   11 months ago Neuropathy   [place], [place]   1 year ago Left hip pain   [place], [place], [place]   1 year ago Need for influenza vaccination   [place], RPH-CPP      Future Appointments            In 4 weeks Ladell Pier, MD [place]

## 2022-04-13 NOTE — Telephone Encounter (Signed)
Reason for Disposition . Abdominal pain is a chronic symptom (recurrent or ongoing AND present > 4 weeks)  Protocols used: Abdominal Pain - Female-A-AH

## 2022-04-14 ENCOUNTER — Other Ambulatory Visit (HOSPITAL_COMMUNITY): Payer: Self-pay

## 2022-04-18 ENCOUNTER — Other Ambulatory Visit: Payer: Self-pay | Admitting: Gynecologic Oncology

## 2022-04-18 DIAGNOSIS — G8918 Other acute postprocedural pain: Secondary | ICD-10-CM

## 2022-04-19 ENCOUNTER — Telehealth: Payer: Self-pay

## 2022-04-19 ENCOUNTER — Other Ambulatory Visit: Payer: Self-pay | Admitting: Gynecologic Oncology

## 2022-04-19 ENCOUNTER — Other Ambulatory Visit: Payer: Self-pay

## 2022-04-19 DIAGNOSIS — R1084 Generalized abdominal pain: Secondary | ICD-10-CM

## 2022-04-19 DIAGNOSIS — G8918 Other acute postprocedural pain: Secondary | ICD-10-CM

## 2022-04-19 NOTE — Progress Notes (Signed)
See RN note. Given persistent left sided abdominal pain s/p robotic-assisted laparoscopic left salpingo-oophorectomy, lysis of adhesions of 60 minutes, left ureterolysis, oversew of rectal serosa on 02/14/22 for a left adnexal mass, plan to order a CT scan to evaluate for cause of pain. She had extensive lysis of adhesions and oversewing of the rectal serosa intraop. Hx of hyst, RSO, and recent LSO.

## 2022-04-19 NOTE — Telephone Encounter (Addendum)
Spoke with Lisa Haley regarding request for Refill for Oxycodone  5 mg from Surgery 02-14-22. She is afebrile. Lisa Haley states that she has begun with abdominal pain  on the left side about 3 weeks ago. It is similar to the pain she had prior to surgery. She is using tylenol with minimal effect. She states that she is moving her bowels well daily. She does have an appointment with a gyn Dr. Rubie Maid in [place] to establish care on 05-03-22.  She can call that office to be seen earlier then 05-03-22 The Oxycodone will not be refilled per Joylene John, NP.

## 2022-04-19 NOTE — Telephone Encounter (Signed)
Told Lisa Haley that Lisa John, NP is ordering a CT scan of her abdomen and pelvis with contrast  to evaluate her abdominal pain. Lisa Haley agreeable to the scan. Will call Lisa Haley  with the appointment when scheduled.

## 2022-04-20 ENCOUNTER — Other Ambulatory Visit: Payer: Self-pay

## 2022-04-21 ENCOUNTER — Telehealth: Payer: Self-pay

## 2022-04-21 NOTE — Telephone Encounter (Signed)
Spoke with pt to advise of CT scan 04/26/22 @ 1:30 PM. Patient aware of prep instructions.

## 2022-04-25 ENCOUNTER — Other Ambulatory Visit: Payer: Self-pay

## 2022-04-25 ENCOUNTER — Other Ambulatory Visit (HOSPITAL_COMMUNITY): Payer: Self-pay

## 2022-04-25 ENCOUNTER — Other Ambulatory Visit: Payer: Self-pay | Admitting: Internal Medicine

## 2022-04-25 DIAGNOSIS — R0602 Shortness of breath: Secondary | ICD-10-CM

## 2022-04-25 DIAGNOSIS — G629 Polyneuropathy, unspecified: Secondary | ICD-10-CM

## 2022-04-25 MED ORDER — GABAPENTIN 300 MG PO CAPS
300.0000 mg | ORAL_CAPSULE | Freq: Every day | ORAL | 2 refills | Status: DC
  Filled 2022-04-25 – 2022-05-24 (×2): qty 90, 90d supply, fill #0
  Filled 2022-08-09 – 2022-10-12 (×5): qty 90, 90d supply, fill #1
  Filled 2022-11-30: qty 30, 30d supply, fill #1

## 2022-04-25 NOTE — Telephone Encounter (Signed)
Requested Prescriptions  Pending Prescriptions Disp Refills  . gabapentin (NEURONTIN) 300 MG capsule 90 capsule 2    Sig: Take 1 capsule (300 mg total) by mouth at bedtime.     Neurology: Anticonvulsants - gabapentin Passed - 04/25/2022 12:04 PM      Passed - Cr in normal range and within 360 days    Creatinine, Ser  Date Value Ref Range Status  02/14/2022 0.95 0.44 - 1.00 mg/dL Final         Passed - Completed PHQ-2 or PHQ-9 in the last 360 days      Passed - Valid encounter within last 12 months    Recent Outpatient Visits          2 months ago Pre-operative clearance   [place], [place]   5 months ago Lower abdominal pain   [place], [place], [place]   1 year ago Neuropathy   [place], [place]   1 year ago Left hip pain   [place], [place], [place]   1 year ago Need for influenza vaccination   [place], RPH-CPP      Future Appointments            In 2 weeks Ladell Pier, MD [place]

## 2022-04-26 ENCOUNTER — Inpatient Hospital Stay: Payer: Commercial Managed Care - HMO | Attending: Gynecologic Oncology

## 2022-04-26 ENCOUNTER — Ambulatory Visit (HOSPITAL_COMMUNITY): Admission: RE | Admit: 2022-04-26 | Payer: Commercial Managed Care - HMO | Source: Ambulatory Visit

## 2022-04-26 ENCOUNTER — Encounter (HOSPITAL_COMMUNITY): Payer: Self-pay

## 2022-04-26 ENCOUNTER — Telehealth: Payer: Self-pay

## 2022-04-26 NOTE — Telephone Encounter (Signed)
LVM for patient to call office regarding a missed appointment today. Pt did not show for her CT scan.

## 2022-04-27 NOTE — Telephone Encounter (Signed)
Attempted to reach out to Lisa Haley regarding the CT scan she missed yesterday. LVM for her to call the office back.

## 2022-05-01 ENCOUNTER — Other Ambulatory Visit (HOSPITAL_COMMUNITY): Payer: Self-pay

## 2022-05-03 ENCOUNTER — Encounter: Payer: Commercial Managed Care - HMO | Admitting: Obstetrics and Gynecology

## 2022-05-03 NOTE — Progress Notes (Incomplete)
GYNECOLOGY ANNUAL PHYSICAL EXAM PROGRESS NOTE  Subjective:    Lisa Haley is a 46 y.o. G4P3 female who presents for an annual exam. The patient has no complaints today. The patient {is/is not/has never been:13135} sexually active. The patient participates in regular exercise: {yes/no/not asked:9010}. Has the patient ever been transfused or tattooed?: {yes/no/not asked:9010}. The patient reports that there {is/is not:9024} domestic violence in her life.    Menstrual History: Menarche age: *** No LMP recorded. Patient has had a hysterectomy.     Gynecologic History:  Contraception: {method:5051} History of STI's:  Last Pap: ***. Results were: {norm/abn:16337}.  ***Denies/Notes h/o abnormal pap smears. Last mammogram: ***. Results were: {norm/abn:16337}       OB History  Gravida Para Term Preterm AB Living  4 3 0 0 0 0  SAB IAB Ectopic Multiple Live Births  0 0 0 0 0    # Outcome Date GA Lbr Len/2nd Weight Sex Delivery Anes PTL Lv  4 Gravida           3 Para           2 Para           1 Para             Past Medical History:  Diagnosis Date   Anemia    after hysterectomy   Arthritis    GERD (gastroesophageal reflux disease)    Hypertension    Obesity    Stroke ([place]) 2021   SAH, admitted at [facility] 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    Past Surgical History:  Procedure Laterality Date   ABDOMINAL HYSTERECTOMY     in 30s for heavy bleeding, RSO   IR ANGIO INTRA EXTRACRAN SEL INTERNAL CAROTID BILAT MOD SED  07/06/2020   IR ANGIO INTRA EXTRACRAN SEL INTERNAL CAROTID BILAT MOD SED  07/12/2020   IR ANGIO VERTEBRAL SEL VERTEBRAL BILAT MOD SED  07/06/2020   IR ANGIO VERTEBRAL SEL VERTEBRAL BILAT MOD SED  07/12/2020   RADIOLOGY WITH ANESTHESIA N/A 07/06/2020   Procedure: IR WITH ANESTHESIA;  Surgeon: Consuella Lose, MD;  Location: [facility];  Service: Radiology;  Laterality: N/A;   ROBOTIC ASSISTED BILATERAL SALPINGO OOPHERECTOMY N/A 02/14/2022   Procedure: XI ROBOTIC  ASSISTED UNILATERAL SALPINGO OOPHORECTOMY, LYSIS OF ADHESIONS, LEFT URETEROLYSIS;  Surgeon: Lafonda Mosses, MD;  Location: [facility];  Service: Gynecology;  Laterality: N/A;   TUBAL LIGATION     WRIST SURGERY Left    for carpal tunnel    Family History  Problem Relation Age of Onset   Ovarian cancer Neg Hx    Uterine cancer Neg Hx    Breast cancer Neg Hx     Social History   Socioeconomic History   Marital status: Single    Spouse name: Not on file   Number of children: Not on file   Years of education: Not on file   Highest education level: Not on file  Occupational History   Not on file  Tobacco Use   Smoking status: Every Day    Packs/day: 0.25    Types: Cigarettes   Smokeless tobacco: Never  Vaping Use   Vaping Use: Never used  Substance and Sexual Activity   Alcohol use: Yes    Comment: once a week   Drug use: Yes    Types: Marijuana    Comment: 3-4x week   Sexual activity: Yes    Birth control/protection: Surgical  Other Topics Concern   Not on file  Social History Narrative   Not on file   Social Determinants of Health   Financial Resource Strain: High Risk (02/07/2022)   Overall Financial Resource Strain (CARDIA)    Difficulty of Paying Living Expenses: Very hard  Food Insecurity: Not on file  Transportation Needs: Unmet Transportation Needs (03/14/2022)   PRAPARE - Hydrologist (Medical): Yes    Lack of Transportation (Non-Medical): Yes  Physical Activity: Not on file  Stress: Not on file  Social Connections: Not on file  Intimate Partner Violence: Not on file    Current Outpatient Medications on File Prior to Visit  Medication Sig Dispense Refill   albuterol (VENTOLIN HFA) 108 (90 Base) MCG/ACT inhaler Inhale 2 puffs into the lungs every 6 (six) hours as needed for wheezing or shortness of breath. 8.5 g 0   amLODipine (NORVASC) 10 MG tablet Take 1 tablet (10 mg total) by mouth daily. 90 tablet 1   cetirizine (ZYRTEC)  10 MG tablet Take 1 tablet (10 mg total) by mouth daily. (Patient taking differently: Take 10 mg by mouth daily as needed for allergies.) 30 tablet 11   cyclobenzaprine (FLEXERIL) 10 MG tablet Take 1/2 tablet (5 mg total) by mouth 3 (three) times daily as needed for muscle spasms. 40 tablet 0   dexamethasone (DECADRON) 1 MG tablet Take 1 tab at 11 p.m night before coming to Cortisol level draw the following morning. 1 tablet 0   gabapentin (NEURONTIN) 300 MG capsule Take 1 capsule (300 mg total) by mouth at bedtime. 90 capsule 2   meloxicam (MOBIC) 7.5 MG tablet Take 1 tablet by mouth daily as needed for pain 30 tablet 0   oxyCODONE (OXY IR/ROXICODONE) 5 MG immediate release tablet Take 1 tablet (5 mg total) by mouth every 4 (four) hours as needed for severe pain. 5 tablet 0   senna-docusate (SENOKOT-S) 8.6-50 MG tablet Take 2 tablets by mouth at bedtime. For AFTER surgery, do not take if having diarrhea 30 tablet 0   No current facility-administered medications on file prior to visit.    No Known Allergies   Review of Systems Constitutional: negative for chills, fatigue, fevers and sweats Eyes: negative for irritation, redness and visual disturbance Ears, nose, mouth, throat, and face: negative for hearing loss, nasal congestion, snoring and tinnitus Respiratory: negative for asthma, cough, sputum Cardiovascular: negative for chest pain, dyspnea, exertional chest pressure/discomfort, irregular heart beat, palpitations and syncope Gastrointestinal: negative for abdominal pain, change in bowel habits, nausea and vomiting Genitourinary: negative for abnormal menstrual periods, genital lesions, sexual problems and vaginal discharge, dysuria and urinary incontinence Integument/breast: negative for breast lump, breast tenderness and nipple discharge Hematologic/lymphatic: negative for bleeding and easy bruising Musculoskeletal:negative for back pain and muscle weakness Neurological: negative for  dizziness, headaches, vertigo and weakness Endocrine: negative for diabetic symptoms including polydipsia, polyuria and skin dryness Allergic/Immunologic: negative for hay fever and urticaria      Objective:  There were no vitals taken for this visit. There is no height or weight on file to calculate BMI.    General Appearance:    Alert, cooperative, no distress, appears stated age  Head:    Normocephalic, without obvious abnormality, atraumatic  Eyes:    PERRL, conjunctiva/corneas clear, EOM's intact, both eyes  Ears:    Normal external ear canals, both ears  Nose:   Nares normal, septum midline, mucosa normal, no drainage or sinus tenderness  Throat:   Lips, mucosa, and tongue normal; teeth and gums  normal  Neck:   Supple, symmetrical, trachea midline, no adenopathy; thyroid: no enlargement/tenderness/nodules; no carotid bruit or JVD  Back:     Symmetric, no curvature, ROM normal, no CVA tenderness  Lungs:     Clear to auscultation bilaterally, respirations unlabored  Chest Wall:    No tenderness or deformity   Heart:    Regular rate and rhythm, S1 and S2 normal, no murmur, rub or gallop  Breast Exam:    No tenderness, masses, or nipple abnormality  Abdomen:     Soft, non-tender, bowel sounds active all four quadrants, no masses, no organomegaly.    Genitalia:    Pelvic:external genitalia normal, vagina without lesions, discharge, or tenderness, rectovaginal septum  normal. Cervix normal in appearance, no cervical motion tenderness, no adnexal masses or tenderness.  Uterus normal size, shape, mobile, regular contours, nontender.  Rectal:    Normal external sphincter.  No hemorrhoids appreciated. Internal exam not done.   Extremities:   Extremities normal, atraumatic, no cyanosis or edema  Pulses:   2+ and symmetric all extremities  Skin:   Skin color, texture, turgor normal, no rashes or lesions  Lymph nodes:   Cervical, supraclavicular, and axillary nodes normal  Neurologic:   CNII-XII  intact, normal strength, sensation and reflexes throughout   .  Labs:  Lab Results  Component Value Date   WBC 6.6 02/02/2022   HGB 14.1 02/02/2022   HCT 42.5 02/02/2022   MCV 97.7 02/02/2022   PLT 297 02/02/2022    Lab Results  Component Value Date   CREATININE 0.95 02/14/2022   BUN 15 02/14/2022   NA 138 02/14/2022   K 5.4 (H) 02/14/2022   CL 110 02/14/2022   CO2 20 (L) 02/14/2022    Lab Results  Component Value Date   ALT 18 02/02/2022   AST 15 02/02/2022   ALKPHOS 69 02/02/2022   BILITOT 0.5 02/02/2022    No results found for: "TSH"   Assessment:   No diagnosis found.   Plan:  Blood tests: {blood tests:13147}. Breast self exam technique reviewed and patient encouraged to perform self-exam monthly. Contraception: {contraceptive methods:5051}. Discussed healthy lifestyle modifications. Mammogram {discussed/ordered:14545} Pap smear {discussed/ordered:14545}. COVID vaccination status: Follow up in 1 year for annual exam   Rubie Maid, MD [place]

## 2022-05-08 ENCOUNTER — Other Ambulatory Visit (HOSPITAL_COMMUNITY): Payer: Self-pay

## 2022-05-09 ENCOUNTER — Ambulatory Visit (HOSPITAL_COMMUNITY)
Admission: RE | Admit: 2022-05-09 | Discharge: 2022-05-09 | Disposition: A | Discharge status: Home / Self Care | Payer: Commercial Managed Care - HMO | Source: Ambulatory Visit | Attending: Gynecologic Oncology | Admitting: Gynecologic Oncology

## 2022-05-09 DIAGNOSIS — R1084 Generalized abdominal pain: Secondary | ICD-10-CM | POA: Insufficient documentation

## 2022-05-09 LAB — POCT I-STAT CREATININE: Creatinine, Ser: 1.3 mg/dL — ABNORMAL HIGH (ref 0.44–1.00)

## 2022-05-09 MED ORDER — IOHEXOL 300 MG/ML  SOLN
100.0000 mL | Freq: Once | INTRAMUSCULAR | Status: AC | PRN
  Administered 2022-05-09: 100 mL via INTRAVENOUS

## 2022-05-10 ENCOUNTER — Telehealth: Payer: Self-pay

## 2022-05-10 NOTE — Telephone Encounter (Signed)
I LVM for pt to call back regarding lab results.   Per Joylene John NP, Creatinine level from yesterday is elevated to 1.30. Pt needs to stay hydrated and if she is taking Mobic on a regular basis, discontinue use.   lab report faxed to pt's PCP Dr. Karle Plumber

## 2022-05-10 NOTE — Telephone Encounter (Signed)
Patient returned call, informed patient of her creatinine results. Advised patient to stop taking Mobic and to increase her fluid intake. Patient verbalized understanding and had no further questions at this time.

## 2022-05-11 ENCOUNTER — Telehealth: Payer: Self-pay

## 2022-05-11 NOTE — Telephone Encounter (Signed)
Per Joylene John NP, CT scan showed no abnormalities to explain pain. Faxed and confirmed to PCP, Dr. Karle Plumber.   Pt is aware and states she has an appointment with her PCP tomorrow

## 2022-05-12 ENCOUNTER — Ambulatory Visit: Payer: Commercial Managed Care - HMO | Admitting: Internal Medicine

## 2022-05-22 ENCOUNTER — Other Ambulatory Visit: Payer: Self-pay

## 2022-05-24 ENCOUNTER — Other Ambulatory Visit (HOSPITAL_COMMUNITY): Payer: Self-pay

## 2022-05-24 ENCOUNTER — Other Ambulatory Visit: Payer: Self-pay | Admitting: Physician Assistant

## 2022-05-24 ENCOUNTER — Ambulatory Visit: Payer: Self-pay

## 2022-05-24 ENCOUNTER — Telehealth: Payer: Commercial Managed Care - HMO | Admitting: Physician Assistant

## 2022-05-24 ENCOUNTER — Other Ambulatory Visit: Payer: Self-pay | Admitting: Family Medicine

## 2022-05-24 DIAGNOSIS — R1030 Lower abdominal pain, unspecified: Secondary | ICD-10-CM

## 2022-05-24 DIAGNOSIS — G8929 Other chronic pain: Secondary | ICD-10-CM | POA: Diagnosis not present

## 2022-05-24 DIAGNOSIS — G629 Polyneuropathy, unspecified: Secondary | ICD-10-CM

## 2022-05-24 DIAGNOSIS — M62838 Other muscle spasm: Secondary | ICD-10-CM

## 2022-05-24 DIAGNOSIS — R109 Unspecified abdominal pain: Secondary | ICD-10-CM | POA: Diagnosis not present

## 2022-05-24 DIAGNOSIS — R0602 Shortness of breath: Secondary | ICD-10-CM

## 2022-05-24 MED ORDER — CYCLOBENZAPRINE HCL 10 MG PO TABS
5.0000 mg | ORAL_TABLET | Freq: Three times a day (TID) | ORAL | 0 refills | Status: DC | PRN
  Filled 2022-05-24: qty 40, 27d supply, fill #0

## 2022-05-24 MED ORDER — CETIRIZINE HCL 10 MG PO TABS
10.0000 mg | ORAL_TABLET | Freq: Every day | ORAL | 0 refills | Status: DC
  Filled 2022-05-24 – 2022-07-04 (×2): qty 30, 30d supply, fill #0

## 2022-05-24 NOTE — Telephone Encounter (Signed)
Requested medication (s) are due for refill today: yes  Requested medication (s) are on the active medication list: yes    Last refill: 04/20/21  # 30 11 refills  Future visit scheduled yes 06/22/22  Notes to clinic:Historical Provider, please review. Thank you.  Requested Prescriptions  Pending Prescriptions Disp Refills   cetirizine (ZYRTEC) 10 MG tablet 30 tablet 11    Sig: Take 1 tablet (10 mg total) by mouth daily.     Ear, Nose, and Throat:  Antihistamines 2 Failed - 05/24/2022 11:38 AM      Failed - Cr in normal range and within 360 days    Creatinine, Ser  Date Value Ref Range Status  05/09/2022 1.30 (H) 0.44 - 1.00 mg/dL Final         Passed - Valid encounter within last 12 months    Recent Outpatient Visits           3 months ago Pre-operative clearance   [place], [place]   6 months ago Lower abdominal pain   [facility] [place], [place], [place]   1 year ago Neuropathy   [place], [place]   1 year ago Left hip pain   [facility] [place], [place], [place]   1 year ago Need for influenza vaccination   [place], RPH-CPP       Future Appointments             In 4 weeks Thereasa Solo, Dionne Bucy, PA-C [place]

## 2022-05-24 NOTE — Telephone Encounter (Addendum)
Chief Complaint: Abdominal pain Symptoms: 8/10 constant pain that's worse with activity Frequency: ongoing since surgery August Pertinent Negatives: Patient denies other symptoms Disposition: '[]'$ ED /'[]'$ Urgent Care (no appt availability in office) / '[x]'$ Appointment(In office/virtual)/ '[x]'$  [facility]/ '[]'$ Home Care/ '[]'$ Refused Recommended Disposition /'[]'$ [facility]/ '[]'$  Follow-up with PCP Additional Notes: Advised no availability with PCP, scheduled first available on 06/22/22, also requests if provider could call in Tramadol that she's had in the past for the abdominal pain, advised she would need an evaluation by a provider. I advised virtual UC before she accepted the appointment on 06/22/22, she asked to go ahead and schedule for that virtual UC today so that she can ask about pain medication, advised I will also place on waiting list for cancellations. Patient verbalized understanding. Patient also mentioned she was able to review her CT abdomen results done on 05/09/22 and she has questions about the results showing buildup of plaque in the aorta, advised her provider would have to discuss that part at an [facility].   Reason for Disposition  Abdominal pain is a chronic symptom (recurrent or ongoing AND present > 4 weeks)  Answer Assessment - Initial Assessment Questions 1. LOCATION: "Where does it hurt?"      Left lower abdomen 2. RADIATION: "Does the pain shoot anywhere else?" (e.g., chest, back)     No 3. ONSET: "When did the pain begin?" (e.g., minutes, hours or days ago)      Ongoing off and on since the surgery in August 4. SUDDEN: "Gradual or sudden onset?"     Gradual 5. PATTERN "Does the pain come and go, or is it constant?"    - If it comes and goes: "How long does it last?" "Do you have pain now?"     (Note: Comes and goes means the pain is intermittent. It goes away completely between bouts.)    - If constant: "Is it getting better, staying the same, or getting  worse?"      (Note: Constant means the pain never goes away completely; most serious pain is constant and gets worse.)      Constant, but happens mostly when up walking around on sleeping on the left side to long, no pain at rest 6. SEVERITY: "How bad is the pain?"  (e.g., Scale 1-10; mild, moderate, or severe)    - MILD (1-3): Doesn't interfere with normal activities, abdomen soft and not tender to touch.     - MODERATE (4-7): Interferes with normal activities or awakens from sleep, abdomen tender to touch.     - SEVERE (8-10): Excruciating pain, doubled over, unable to do any normal activities.       8 7. RELIEVING/AGGRAVATING FACTORS: "What makes it better or worse?" (e.g., antacids, bending or twisting motion, bowel movement)     OTC Pills relieves it a little, but always painful 10. OTHER SYMPTOMS: "Do you have any other symptoms?" (e.g., back pain, diarrhea, fever, urination pain, vomiting)      No 11. PREGNANCY: "Is there any chance you are pregnant?" "When was your last menstrual period?"       No  Protocols used: Abdominal Pain - [facility]

## 2022-05-24 NOTE — Telephone Encounter (Signed)
Pt had a virtual village.

## 2022-05-24 NOTE — Patient Instructions (Signed)
  [name], thank you for joining Leeanne Rio, PA-C for today's virtual visit.  While this provider is not your primary care provider (PCP), if your PCP is located in our provider database this encounter information will be shared with them immediately following your visit.   [facility] account gives you access to today's visit and all your visits, tests, and labs performed at [facility] " click here if you don't have a [facility] account or go to [email or web address]  Consent: (Patient) Lisa Haley provided verbal consent for this virtual visit at the beginning of the encounter.  Current Medications:  Current Outpatient Medications:    albuterol (VENTOLIN HFA) 108 (90 Base) MCG/ACT inhaler, Inhale 2 puffs into the lungs every 6 (six) hours as needed for wheezing or shortness of breath., Disp: 8.5 g, Rfl: 0   amLODipine (NORVASC) 10 MG tablet, Take 1 tablet (10 mg total) by mouth daily., Disp: 90 tablet, Rfl: 1   cetirizine (ZYRTEC) 10 MG tablet, Take 1 tablet (10 mg total) by mouth daily. (Patient taking differently: Take 10 mg by mouth daily as needed for allergies.), Disp: 30 tablet, Rfl: 11   cyclobenzaprine (FLEXERIL) 10 MG tablet, Take 1/2 tablet (5 mg total) by mouth 3 (three) times daily as needed for muscle spasms., Disp: 40 tablet, Rfl: 0   dexamethasone (DECADRON) 1 MG tablet, Take 1 tab at 11 p.m night before coming to Cortisol level draw the following morning., Disp: 1 tablet, Rfl: 0   gabapentin (NEURONTIN) 300 MG capsule, Take 1 capsule (300 mg total) by mouth at bedtime., Disp: 90 capsule, Rfl: 2   meloxicam (MOBIC) 7.5 MG tablet, Take 1 tablet by mouth daily as needed for pain, Disp: 30 tablet, Rfl: 0   oxyCODONE (OXY IR/ROXICODONE) 5 MG immediate release tablet, Take 1 tablet (5 mg total) by mouth every 4 (four) hours as needed for severe pain., Disp: 5 tablet, Rfl: 0   senna-docusate (SENOKOT-S) 8.6-50 MG tablet,  Take 2 tablets by mouth at bedtime. For AFTER surgery, do not take if having diarrhea, Disp: 30 tablet, Rfl: 0   Medications ordered in this encounter:  No orders of the defined types were placed in this encounter.    *If you need refills on other medications prior to your next appointment, please contact your pharmacy*  Follow-Up: Call back or seek an in-person evaluation if the symptoms worsen or if the condition fails to improve as anticipated.  [facility] [phone]   If you have been instructed to have an in-person evaluation today at a local Urgent Care facility, please use the link below. It will take you to a list of all of our available [facility], including address, phone number and hours of operation. Please do not delay care.  [facility]  If you or a family member do not have a primary care provider, use the link below to schedule a visit and establish care. When you choose a [facility] primary care physician or advanced practice provider, you gain a long-term partner in health. Find a Primary Care Provider  Learn more about 's in-office and virtual care options: [facility]

## 2022-05-24 NOTE — Progress Notes (Signed)
Virtual Visit Consent   Lisa Haley, you are scheduled for a virtual visit with a [facility] provider today. Just as with appointments in the office, your consent must be obtained to participate. Your consent will be active for this visit and any virtual visit you may have with one of our providers in the next 365 days. If you have a MyChart account, a copy of this consent can be sent to you electronically.  As this is a virtual visit, video technology does not allow for your provider to perform a traditional examination. This may limit your provider's ability to fully assess your condition. If your provider identifies any concerns that need to be evaluated in person or the need to arrange testing (such as labs, EKG, etc.), we will make arrangements to do so. Although advances in technology are sophisticated, we cannot ensure that it will always work on either your end or our end. If the connection with a video visit is poor, the visit may have to be switched to a telephone visit. With either a video or telephone visit, we are not always able to ensure that we have a secure connection.  By engaging in this virtual visit, you consent to the provision of healthcare and authorize for your insurance to be billed (if applicable) for the services provided during this visit. Depending on your insurance coverage, you may receive a charge related to this service.  I need to obtain your verbal consent now. Are you willing to proceed with your visit today? Lisa Haley has provided verbal consent on 05/24/2022 for a virtual visit (video or telephone). Mar Daring, PA-C  Date: 05/24/2022 12:39 PM  Virtual Visit via Video Note   I, Mar Daring, connected with  Lisa Haley  ([id number], [birth year]) on 05/24/22 at 12:45 PM EST by a video-enabled telemedicine application and verified that I am speaking with the correct person using two identifiers.  Location: Patient: Virtual  Visit Location Patient: Home Provider: Virtual Visit Location Provider: Home Office   I discussed the limitations of evaluation and management by telemedicine and the availability of in person appointments. The patient expressed understanding and agreed to proceed.    History of Present Illness: Lisa Haley is a 46 y.o. who identifies as a female who was assigned female at birth, and is being seen today for chronic abdominal pain.  HPI: Abdominal Pain This is a chronic problem. The current episode started more than 1 month ago.   Had surgery 02/14/22 for hysterectomy, left oopherectomy. Reports no improvement in pain. Scheduled with PCP until 06/22/22. Reports constant pain since surgery. Reports has had tramadol and oxycodone for pain in the past with relief. Had a CT abd/pelvis on 05/09/22 to evaluate for reasons of pain from OB/GYN and no cause of pain noted.   Problems:  Patient Active Problem List   Diagnosis Date Noted   Adnexal mass 01/24/2022   BMI 45.0-49.9, adult ([place]) 01/24/2022   History of stroke 01/24/2022   Neuropathy 04/21/2021   Left hip pain 04/21/2021   SOB (shortness of breath) 04/21/2021   Migraine without aura and without status migrainosus, not intractable 08/06/2020   Essential hypertension 08/06/2020   Tobacco dependence 08/06/2020   Class 3 severe obesity due to excess calories with serious comorbidity and body mass index (BMI) of 45.0 to 49.9 in adult [facility]) 08/06/2020   SAH (subarachnoid hemorrhage) ([place]) 07/06/2020     Allergies: No Known Allergies Medications:  Current Outpatient Medications:  albuterol (VENTOLIN HFA) 108 (90 Base) MCG/ACT inhaler, Inhale 2 puffs into the lungs every 6 (six) hours as needed for wheezing or shortness of breath., Disp: 8.5 g, Rfl: 0   amLODipine (NORVASC) 10 MG tablet, Take 1 tablet (10 mg total) by mouth daily., Disp: 90 tablet, Rfl: 1   cetirizine (ZYRTEC) 10 MG tablet, Take 1 tablet (10 mg total) by mouth daily.  (Patient taking differently: Take 10 mg by mouth daily as needed for allergies.), Disp: 30 tablet, Rfl: 11   cyclobenzaprine (FLEXERIL) 10 MG tablet, Take 1/2 tablet (5 mg total) by mouth 3 (three) times daily as needed for muscle spasms., Disp: 40 tablet, Rfl: 0   dexamethasone (DECADRON) 1 MG tablet, Take 1 tab at 11 p.m night before coming to Cortisol level draw the following morning., Disp: 1 tablet, Rfl: 0   gabapentin (NEURONTIN) 300 MG capsule, Take 1 capsule (300 mg total) by mouth at bedtime., Disp: 90 capsule, Rfl: 2   meloxicam (MOBIC) 7.5 MG tablet, Take 1 tablet by mouth daily as needed for pain, Disp: 30 tablet, Rfl: 0   oxyCODONE (OXY IR/ROXICODONE) 5 MG immediate release tablet, Take 1 tablet (5 mg total) by mouth every 4 (four) hours as needed for severe pain., Disp: 5 tablet, Rfl: 0   senna-docusate (SENOKOT-S) 8.6-50 MG tablet, Take 2 tablets by mouth at bedtime. For AFTER surgery, do not take if having diarrhea, Disp: 30 tablet, Rfl: 0  Observations/Objective: Patient is well-developed, well-nourished in no acute distress.  Resting comfortably at home.  Head is normocephalic, atraumatic.  No labored breathing.  Speech is clear and coherent with logical content.  Patient is alert and oriented at baseline.    Assessment and Plan: 1. Chronic abdominal pain  - Advised we are unable to prescribe controlled substances via a virtual department - Discussed we could prescribe NSAIDs and alternate with Tylenol; she politely declines stating she is already doing this - Asked directly for Tramadol but advised this would require face to face evaluation; she states she will be seen in person - Keep scheduled PCP appt on 06/22/22 for follow up  Follow Up Instructions: I discussed the assessment and treatment plan with the patient. The patient was provided an opportunity to ask questions and all were answered. The patient agreed with the plan and demonstrated an understanding of the  instructions.  A copy of instructions were sent to the patient via MyChart unless otherwise noted below.    The patient was advised to call back or seek an in-person evaluation if the symptoms worsen or if the condition fails to improve as anticipated.  Time:  I spent 7 minutes with the patient via telehealth technology discussing the above problems/concerns.    Mar Daring, PA-C

## 2022-05-26 ENCOUNTER — Other Ambulatory Visit (HOSPITAL_COMMUNITY): Payer: Self-pay

## 2022-05-30 ENCOUNTER — Other Ambulatory Visit (HOSPITAL_COMMUNITY): Payer: Self-pay

## 2022-05-30 ENCOUNTER — Encounter (HOSPITAL_COMMUNITY): Payer: Self-pay

## 2022-05-30 ENCOUNTER — Ambulatory Visit: Payer: Self-pay | Admitting: Family Medicine

## 2022-06-02 ENCOUNTER — Other Ambulatory Visit: Payer: Self-pay | Admitting: Internal Medicine

## 2022-06-02 ENCOUNTER — Other Ambulatory Visit (HOSPITAL_COMMUNITY): Payer: Self-pay

## 2022-06-02 DIAGNOSIS — R1030 Lower abdominal pain, unspecified: Secondary | ICD-10-CM

## 2022-06-05 ENCOUNTER — Other Ambulatory Visit (HOSPITAL_COMMUNITY): Payer: Self-pay

## 2022-06-05 MED ORDER — MELOXICAM 7.5 MG PO TABS
7.5000 mg | ORAL_TABLET | Freq: Every day | ORAL | 0 refills | Status: DC
  Filled 2022-06-05: qty 90, 90d supply, fill #0

## 2022-06-05 NOTE — Telephone Encounter (Signed)
Requested Prescriptions  Pending Prescriptions Disp Refills   meloxicam (MOBIC) 7.5 MG tablet 90 tablet 0    Sig: Take 1 tablet by mouth daily as needed for pain     Analgesics:  COX2 Inhibitors Failed - 06/02/2022  9:21 AM      Failed - Manual Review: Labs are only required if the patient has taken medication for more than 8 weeks.      Failed - Cr in normal range and within 360 days    Creatinine, Ser  Date Value Ref Range Status  05/09/2022 1.30 (H) 0.44 - 1.00 mg/dL Final         Passed - HGB in normal range and within 360 days    Hemoglobin  Date Value Ref Range Status  02/02/2022 14.1 12.0 - 15.0 g/dL Final  11/09/2021 13.5 11.1 - 15.9 g/dL Final         Passed - HCT in normal range and within 360 days    HCT  Date Value Ref Range Status  02/02/2022 42.5 36.0 - 46.0 % Final   Hematocrit  Date Value Ref Range Status  11/09/2021 38.5 34.0 - 46.6 % Final         Passed - AST in normal range and within 360 days    AST  Date Value Ref Range Status  02/02/2022 15 15 - 41 U/L Final         Passed - ALT in normal range and within 360 days    ALT  Date Value Ref Range Status  02/02/2022 18 0 - 44 U/L Final         Passed - eGFR is 30 or above and within 360 days    GFR calc Af Amer  Date Value Ref Range Status  08/08/2016 >60 >60 mL/min Final    Comment:    (NOTE) The eGFR has been calculated using the CKD EPI equation. This calculation has not been validated in all clinical situations. eGFR's persistently <60 mL/min signify possible Chronic Kidney Disease.    GFR, Estimated  Date Value Ref Range Status  02/14/2022 >60 >60 mL/min Final    Comment:    (NOTE) Calculated using the CKD-EPI Creatinine Equation (2021)    eGFR  Date Value Ref Range Status  02/06/2022 71 >59 mL/min/1.73 Final         Passed - Patient is not pregnant      Passed - Valid encounter within last 12 months    Recent Outpatient Visits           4 months ago Pre-operative  clearance   [place], [place]   6 months ago Lower abdominal pain   [place], [place], [place]   1 year ago Neuropathy   [place], [place]   1 year ago Left hip pain   [place], [place], [place]   1 year ago Need for influenza vaccination   [place], RPH-CPP       Future Appointments             In 2 weeks Thereasa Solo, Dionne Bucy, PA-C [place]

## 2022-06-06 ENCOUNTER — Other Ambulatory Visit: Payer: Self-pay | Admitting: Internal Medicine

## 2022-06-06 DIAGNOSIS — G629 Polyneuropathy, unspecified: Secondary | ICD-10-CM

## 2022-06-06 DIAGNOSIS — R1030 Lower abdominal pain, unspecified: Secondary | ICD-10-CM

## 2022-06-06 DIAGNOSIS — M62838 Other muscle spasm: Secondary | ICD-10-CM

## 2022-06-13 ENCOUNTER — Other Ambulatory Visit (HOSPITAL_COMMUNITY): Payer: Self-pay

## 2022-06-22 ENCOUNTER — Ambulatory Visit: Payer: Commercial Managed Care - HMO | Admitting: Physician Assistant

## 2022-06-29 ENCOUNTER — Other Ambulatory Visit: Payer: Self-pay

## 2022-07-04 ENCOUNTER — Other Ambulatory Visit: Payer: Self-pay

## 2022-07-04 ENCOUNTER — Other Ambulatory Visit (HOSPITAL_COMMUNITY): Payer: Self-pay

## 2022-07-05 ENCOUNTER — Other Ambulatory Visit (HOSPITAL_COMMUNITY): Payer: Self-pay

## 2022-07-05 ENCOUNTER — Other Ambulatory Visit: Payer: Self-pay | Admitting: Internal Medicine

## 2022-07-05 DIAGNOSIS — M62838 Other muscle spasm: Secondary | ICD-10-CM

## 2022-07-05 DIAGNOSIS — R1030 Lower abdominal pain, unspecified: Secondary | ICD-10-CM

## 2022-07-05 DIAGNOSIS — G629 Polyneuropathy, unspecified: Secondary | ICD-10-CM

## 2022-07-09 ENCOUNTER — Encounter (HOSPITAL_COMMUNITY): Payer: Self-pay

## 2022-07-09 ENCOUNTER — Emergency Department (HOSPITAL_COMMUNITY)
Admission: EM | Admit: 2022-07-09 | Discharge: 2022-07-09 | Discharge status: Against Medical Advice | Payer: Commercial Managed Care - HMO | Attending: Emergency Medicine | Admitting: Emergency Medicine

## 2022-07-09 ENCOUNTER — Emergency Department (HOSPITAL_COMMUNITY): Payer: Commercial Managed Care - HMO

## 2022-07-09 DIAGNOSIS — R109 Unspecified abdominal pain: Secondary | ICD-10-CM | POA: Diagnosis present

## 2022-07-09 DIAGNOSIS — Z5321 Procedure and treatment not carried out due to patient leaving prior to being seen by health care provider: Secondary | ICD-10-CM | POA: Insufficient documentation

## 2022-07-09 LAB — COMPREHENSIVE METABOLIC PANEL
ALT: 43 U/L (ref 0–44)
AST: 37 U/L (ref 15–41)
Albumin: 2.9 g/dL — ABNORMAL LOW (ref 3.5–5.0)
Alkaline Phosphatase: 52 U/L (ref 38–126)
Anion gap: 6 (ref 5–15)
BUN: 10 mg/dL (ref 6–20)
CO2: 24 mmol/L (ref 22–32)
Calcium: 8.2 mg/dL — ABNORMAL LOW (ref 8.9–10.3)
Chloride: 110 mmol/L (ref 98–111)
Creatinine, Ser: 1.16 mg/dL — ABNORMAL HIGH (ref 0.44–1.00)
GFR, Estimated: 59 mL/min — ABNORMAL LOW (ref >60)
Glucose, Bld: 118 mg/dL — ABNORMAL HIGH (ref 70–99)
Potassium: 3.4 mmol/L — ABNORMAL LOW (ref 3.5–5.1)
Sodium: 140 mmol/L (ref 135–145)
Total Bilirubin: 0.5 mg/dL (ref 0.3–1.2)
Total Protein: 5.5 g/dL — ABNORMAL LOW (ref 6.5–8.1)

## 2022-07-09 LAB — CBC WITH DIFFERENTIAL/PLATELET
Abs Immature Granulocytes: 0.02 10*3/uL (ref 0.00–0.07)
Basophils Absolute: 0 10*3/uL (ref 0.0–0.1)
Basophils Relative: 0 %
Eosinophils Absolute: 0.2 10*3/uL (ref 0.0–0.5)
Eosinophils Relative: 3 %
HCT: 41.3 % (ref 36.0–46.0)
Hemoglobin: 13.5 g/dL (ref 12.0–15.0)
Immature Granulocytes: 0 %
Lymphocytes Relative: 31 %
Lymphs Abs: 1.7 10*3/uL (ref 0.7–4.0)
MCH: 31.5 pg (ref 26.0–34.0)
MCHC: 32.7 g/dL (ref 30.0–36.0)
MCV: 96.3 fL (ref 80.0–100.0)
Monocytes Absolute: 0.6 10*3/uL (ref 0.1–1.0)
Monocytes Relative: 10 %
Neutro Abs: 3.1 10*3/uL (ref 1.7–7.7)
Neutrophils Relative %: 56 %
Platelets: 267 10*3/uL (ref 150–400)
RBC: 4.29 MIL/uL (ref 3.87–5.11)
RDW: 14.2 % (ref 11.5–15.5)
WBC: 5.5 10*3/uL (ref 4.0–10.5)
nRBC: 0 % (ref 0.0–0.2)

## 2022-07-09 LAB — LIPASE, BLOOD: Lipase: 39 U/L (ref 11–51)

## 2022-07-09 MED ORDER — IOHEXOL 300 MG/ML  SOLN
100.0000 mL | Freq: Once | INTRAMUSCULAR | Status: AC | PRN
  Administered 2022-07-09: 100 mL via INTRAVENOUS

## 2022-07-09 NOTE — ED Provider Triage Note (Signed)
Emergency Medicine Provider Triage Evaluation Note  Gelila Well , a 46 y.o. female  was evaluated in triage.  Pt complains of abdominal pain for 3 days. Worse since last night. Denies fever, flank pain, emesis, dysuria. Endorses diarrhea. .  Review of Systems  Positive: As above Negative: As above  Physical Exam  BP (!) 161/124 (BP Location: Left Arm)   Pulse 91   Temp 98 F (36.7 C) (Oral)   Resp 15   SpO2 100%  Gen:   Awake, no distress   Resp:  Normal effort  MSK:   Moves extremities without difficulty  Other:    Medical Decision Making  Medically screening exam initiated at 10:18 AM.  Appropriate orders placed.  Whitni Pasquini was informed that the remainder of the evaluation will be completed by another provider, this initial triage assessment does not replace that evaluation, and the importance of remaining in the ED until their evaluation is complete.     Evlyn Courier, PA-C 07/09/22 1019

## 2022-07-09 NOTE — ED Triage Notes (Signed)
Pt arrived via EMS, c/o abd pain and diarrhea for a couple days.

## 2022-07-10 ENCOUNTER — Other Ambulatory Visit: Payer: Self-pay | Admitting: Internal Medicine

## 2022-07-10 ENCOUNTER — Other Ambulatory Visit: Payer: Self-pay | Admitting: Gynecologic Oncology

## 2022-07-10 DIAGNOSIS — R1030 Lower abdominal pain, unspecified: Secondary | ICD-10-CM

## 2022-07-10 DIAGNOSIS — G629 Polyneuropathy, unspecified: Secondary | ICD-10-CM

## 2022-07-10 DIAGNOSIS — G8918 Other acute postprocedural pain: Secondary | ICD-10-CM

## 2022-07-10 DIAGNOSIS — M62838 Other muscle spasm: Secondary | ICD-10-CM

## 2022-07-11 ENCOUNTER — Telehealth: Payer: Self-pay

## 2022-07-11 ENCOUNTER — Ambulatory Visit: Payer: Self-pay | Admitting: *Deleted

## 2022-07-11 NOTE — Telephone Encounter (Signed)
This encounter was created in error - please disregard.

## 2022-07-11 NOTE — Telephone Encounter (Signed)
Pt returned my call stating she was seen in the ER on 07/09/22 with severe stomach cramping. She states she had a scan that showed (per pt, inflammation of the lg.intestines).  She left the hospital because it was very busy and she was in pain. She states the cramping subsided and now she feels full, and has a burning sensation which starts in her stomach and moves upward.  I asked if she has a GI provider, and she states she doesn't have one.  Pt states she missed a call from her PCP and will call them back regarding an appointment.  Pt aware I will notify Dr. Berline Lopes and if any further advise given, I will call back. She voiced an understanding.

## 2022-07-11 NOTE — Telephone Encounter (Signed)
Summary: NT call not going thru ab pain   Pt has called and was seen in the ER 12-31 with abdominal pain, pt had some lab work done but did not want to wait. Pt is calling [facility] for an appt with PCP, soonest appt give Feb, put on wait list, NT line has glitch cannot get thru. FU with pt at [phone] as states still having ab pain.     Attempted to call patient on number requested- left message to call office to discuss her symptoms

## 2022-07-11 NOTE — Telephone Encounter (Signed)
Second attempt to call patient- no answer- left message on number provided. Also called number listed for patient in chart- left message on that number as well.

## 2022-07-11 NOTE — Telephone Encounter (Signed)
Spoke with patient s/s still present. Patient scheduled for 07/17/22 with A. Minette Brine.

## 2022-07-11 NOTE — Telephone Encounter (Signed)
Call place to patient to schedule an earlier appointment. Unable to reach. Message left.

## 2022-07-11 NOTE — Telephone Encounter (Signed)
Due to receiving a refill request on Oxycodone, Per Dr.Tucker, I reached out to patient to get more information. I had to leave a voicemail for her to call office.

## 2022-07-11 NOTE — Telephone Encounter (Signed)
Summary: NT call not going thru ab pain   Pt has called and was seen in the ER 12-31 with abdominal pain, pt had some lab work done but did not want to wait. Pt is calling [facility] for an appt with PCP, soonest appt give Feb, put on wait list, NT line has glitch cannot get thru. FU with pt at [phone] as states still having ab pain.      Called pt - LMOMTCB

## 2022-07-12 ENCOUNTER — Other Ambulatory Visit: Payer: Self-pay

## 2022-07-12 MED ORDER — CYCLOBENZAPRINE HCL 10 MG PO TABS
5.0000 mg | ORAL_TABLET | Freq: Three times a day (TID) | ORAL | 0 refills | Status: DC | PRN
  Filled 2022-07-12: qty 40, 27d supply, fill #0

## 2022-07-12 NOTE — Telephone Encounter (Signed)
Requested medication (s) are due for refill today: yes  Requested medication (s) are on the active medication list: yes  Last refill:  05/24/22  Future visit scheduled: yes  Notes to clinic:  Unable to refill per protocol, courtesy refill already given, routing for provider approval.      Requested Prescriptions  Pending Prescriptions Disp Refills   cyclobenzaprine (FLEXERIL) 10 MG tablet 40 tablet 0    Sig: Take 1/2 tablet (5 mg total) by mouth 3 (three) times daily as needed for muscle spasms.     Not Delegated - Analgesics:  Muscle Relaxants Failed - 07/10/2022  6:00 PM      Failed - This refill cannot be delegated      Passed - Valid encounter within last 6 months    Recent Outpatient Visits           5 months ago Pre-operative clearance   [place], MD   8 months ago Lower abdominal pain   [place], [place], [place]   1 year ago Neuropathy   [place], [place]   1 year ago Left hip pain   [place], [place], [place]   1 year ago Need for influenza vaccination   [place], RPH-CPP       Future Appointments             In 5 days Camillia Herter, NP [facility]   In 1 month Ladell Pier, MD [place]

## 2022-07-14 NOTE — Progress Notes (Deleted)
Patient ID: Lisa Haley, female    DOB: 29-Jan-1976  MRN: [id number]  CC: No chief complaint on file.   Subjective: Lisa Haley is a 47 y.o. female who presents for  Her concerns today include:   Abdominal pain 07/09/2022 [facility] Emergency Department left [facility]   07/11/2022 per triage RN note:    Summary: NT call not going thru ab pain    Pt has called and was seen in the ER 12-31 with abdominal pain, pt had some lab work done but did not want to wait. Pt is calling [facility] for an appt with PCP, soonest appt give Feb, put on wait list, NT line has glitch cannot get thru. FU with pt at [phone] as states still having ab pain.      Today's visit 07/17/2022:   CT Abdomen  IMPRESSION: 1. Short length segment of distal/terminal ileal small bowel wall thickening, suggestive of an infectious or inflammatory enteritis. 2. Diffuse hepatic steatosis. 3. Scattered colonic diverticulosis without findings of acute diverticulitis. 4. Aortic Atherosclerosis (ICD10-I70.0).  Gastro  Cards    Patient Active Problem List   Diagnosis Date Noted   Adnexal mass 01/24/2022   BMI 45.0-49.9, adult ([place]) 01/24/2022   History of stroke 01/24/2022   Neuropathy 04/21/2021   Left hip pain 04/21/2021   SOB (shortness of breath) 04/21/2021   Migraine without aura and without status migrainosus, not intractable 08/06/2020   Essential hypertension 08/06/2020   Tobacco dependence 08/06/2020   Class 3 severe obesity due to excess calories with serious comorbidity and body mass index (BMI) of 45.0 to 49.9 in adult [facility]) 08/06/2020   SAH (subarachnoid hemorrhage) ([place]) 07/06/2020     Current Outpatient Medications on File Prior to Visit  Medication Sig Dispense Refill   albuterol (VENTOLIN HFA) 108 (90 Base) MCG/ACT inhaler Inhale 2 puffs into the lungs every 6 (six) hours as needed for wheezing or shortness of breath. 8.5 g 0   amLODipine (NORVASC) 10 MG tablet Take 1 tablet (10  mg total) by mouth daily. 90 tablet 1   cetirizine (ZYRTEC) 10 MG tablet Take 1 tablet (10 mg total) by mouth daily. 30 tablet 0   cyclobenzaprine (FLEXERIL) 10 MG tablet Take 1/2 tablet (5 mg total) by mouth 3 (three) times daily as needed for muscle spasms. *Must keep upcoming appt for additional refills* 40 tablet 0   dexamethasone (DECADRON) 1 MG tablet Take 1 tab at 11 p.m night before coming to Cortisol level draw the following morning. 1 tablet 0   gabapentin (NEURONTIN) 300 MG capsule Take 1 capsule (300 mg total) by mouth at bedtime. 90 capsule 2   meloxicam (MOBIC) 7.5 MG tablet Take 1 tablet by mouth daily as needed for pain 90 tablet 0   oxyCODONE (OXY IR/ROXICODONE) 5 MG immediate release tablet Take 1 tablet (5 mg total) by mouth every 4 (four) hours as needed for severe pain. 5 tablet 0   senna-docusate (SENOKOT-S) 8.6-50 MG tablet Take 2 tablets by mouth at bedtime. For AFTER surgery, do not take if having diarrhea 30 tablet 0   No current facility-administered medications on file prior to visit.    No Known Allergies  Social History   Socioeconomic History   Marital status: Single    Spouse name: Not on file   Number of children: Not on file   Years of education: Not on file   Highest education level: Not on file  Occupational History   Not on file  Tobacco Use   Smoking status: Every Day    Packs/day: 0.25    Types: Cigarettes   Smokeless tobacco: Never  Vaping Use   Vaping Use: Never used  Substance and Sexual Activity   Alcohol use: Yes    Comment: once a week   Drug use: Yes    Types: Marijuana    Comment: 3-4x week   Sexual activity: Yes    Birth control/protection: Surgical  Other Topics Concern   Not on file  Social History Narrative   Not on file   Social Determinants of Health   Financial Resource Strain: High Risk (02/07/2022)   Overall Financial Resource Strain (CARDIA)    Difficulty of Paying Living Expenses: Very hard  Food Insecurity: Not  on file  Transportation Needs: Unmet Transportation Needs (03/14/2022)   PRAPARE - Hydrologist (Medical): Yes    Lack of Transportation (Non-Medical): Yes  Physical Activity: Not on file  Stress: Not on file  Social Connections: Not on file  Intimate Partner Violence: Not on file    Family History  Problem Relation Age of Onset   Ovarian cancer Neg Hx    Uterine cancer Neg Hx    Breast cancer Neg Hx     Past Surgical History:  Procedure Laterality Date   ABDOMINAL HYSTERECTOMY     in 28s for heavy bleeding, RSO   IR ANGIO INTRA EXTRACRAN SEL INTERNAL CAROTID BILAT MOD SED  07/06/2020   IR ANGIO INTRA EXTRACRAN SEL INTERNAL CAROTID BILAT MOD SED  07/12/2020   IR ANGIO VERTEBRAL SEL VERTEBRAL BILAT MOD SED  07/06/2020   IR ANGIO VERTEBRAL SEL VERTEBRAL BILAT MOD SED  07/12/2020   RADIOLOGY WITH ANESTHESIA N/A 07/06/2020   Procedure: IR WITH ANESTHESIA;  Surgeon: Consuella Lose, MD;  Location: [facility];  Service: Radiology;  Laterality: N/A;   ROBOTIC ASSISTED BILATERAL SALPINGO OOPHERECTOMY N/A 02/14/2022   Procedure: XI ROBOTIC ASSISTED UNILATERAL SALPINGO OOPHORECTOMY, LYSIS OF ADHESIONS, LEFT URETEROLYSIS;  Surgeon: Lafonda Mosses, MD;  Location: [facility];  Service: Gynecology;  Laterality: N/A;   TUBAL LIGATION     WRIST SURGERY Left    for carpal tunnel    ROS: Review of Systems Negative except as stated above  PHYSICAL EXAM: There were no vitals taken for this visit.  Physical Exam  {female adult master:[id number]} {female adult master:[id number]}     Latest Ref Rng & Units 07/09/2022   11:00 AM 05/09/2022    4:38 PM 02/14/2022    6:43 AM  CMP  Glucose 70 - 99 mg/dL 118   85   BUN 6 - 20 mg/dL 10   15   Creatinine 0.44 - 1.00 mg/dL 1.16  1.30  0.95   Sodium 135 - 145 mmol/L 140   138   Potassium 3.5 - 5.1 mmol/L 3.4   5.4   Chloride 98 - 111 mmol/L 110   110   CO2 22 - 32 mmol/L 24   20   Calcium 8.9 - 10.3 mg/dL 8.2   8.7   Total  Protein 6.5 - 8.1 g/dL 5.5     Total Bilirubin 0.3 - 1.2 mg/dL 0.5     Alkaline Phos 38 - 126 U/L 52     AST 15 - 41 U/L 37     ALT 0 - 44 U/L 43      Lipid Panel     Component Value Date/Time   TRIG 80 07/11/2020 0030  CBC    Component Value Date/Time   WBC 5.5 07/09/2022 1100   RBC 4.29 07/09/2022 1100   HGB 13.5 07/09/2022 1100   HGB 13.5 11/09/2021 1511   HCT 41.3 07/09/2022 1100   HCT 38.5 11/09/2021 1511   PLT 267 07/09/2022 1100   PLT 275 11/09/2021 1511   MCV 96.3 07/09/2022 1100   MCV 94 11/09/2021 1511   MCH 31.5 07/09/2022 1100   MCHC 32.7 07/09/2022 1100   RDW 14.2 07/09/2022 1100   RDW 12.9 11/09/2021 1511   LYMPHSABS 1.7 07/09/2022 1100   LYMPHSABS 2.1 11/09/2021 1511   MONOABS 0.6 07/09/2022 1100   EOSABS 0.2 07/09/2022 1100   EOSABS 0.5 (H) 11/09/2021 1511   BASOSABS 0.0 07/09/2022 1100   BASOSABS 0.1 11/09/2021 1511    ASSESSMENT AND PLAN:  There are no diagnoses linked to this encounter.   Patient was given the opportunity to ask questions.  Patient verbalized understanding of the plan and was able to repeat key elements of the plan. Patient was given clear instructions to go to Emergency Department or return to medical center if symptoms don't improve, worsen, or new problems develop.The patient verbalized understanding.   No orders of the defined types were placed in this encounter.    Requested Prescriptions    No prescriptions requested or ordered in this encounter    No follow-ups on file.  Camillia Herter, NP

## 2022-07-17 ENCOUNTER — Ambulatory Visit: Payer: Commercial Managed Care - HMO | Admitting: Physician Assistant

## 2022-07-17 ENCOUNTER — Ambulatory Visit: Payer: Commercial Managed Care - HMO | Admitting: Family

## 2022-07-17 DIAGNOSIS — I700 Atherosclerosis of aorta: Secondary | ICD-10-CM

## 2022-07-17 DIAGNOSIS — K5730 Diverticulosis of large intestine without perforation or abscess without bleeding: Secondary | ICD-10-CM

## 2022-07-17 DIAGNOSIS — K760 Fatty (change of) liver, not elsewhere classified: Secondary | ICD-10-CM

## 2022-07-26 ENCOUNTER — Ambulatory Visit (INDEPENDENT_AMBULATORY_CARE_PROVIDER_SITE_OTHER): Payer: Self-pay | Admitting: Obstetrics and Gynecology

## 2022-07-26 ENCOUNTER — Encounter: Payer: Self-pay | Admitting: Obstetrics and Gynecology

## 2022-07-26 VITALS — BP 147/97 | HR 92 | Ht 62.0 in | Wt 253.2 lb

## 2022-07-26 DIAGNOSIS — R102 Pelvic and perineal pain: Secondary | ICD-10-CM | POA: Insufficient documentation

## 2022-07-26 NOTE — Progress Notes (Signed)
Pt states that she did take her Norvask this am.

## 2022-07-26 NOTE — Progress Notes (Signed)
  CC: pelvic pain Subjective:    Patient ID: Lisa Haley, female    DOB: 12/20/1975, 47 y.o.   MRN: [id number]  HPI PT seen for discussion of continued pelvic pain.  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  Op note reviewed from Dr. Berline Lopes.  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  Pt has continued to have some abdominal pain.     Review of Systems     Objective:   Physical Exam Vitals:   07/26/22 1504 07/26/22 1512  BP: (!) 148/93 (!) 147/97  Pulse: 93 92         Assessment & Plan:   1. Pelvic pain in female Explained to pt she may be better served following up with Dr. Berline Lopes since she did the last surgery 8/23, but more because all reproductive organs have been removed.  Pt may need evaluation by general surgery or gastroenterology if pain continues.  I spent 10 minutes dedicated to the care of this patient including previsit review of records, face to face time with the patient discussing previous surgery and CT findings and post visit testing.   Griffin Basil, MD Faculty Attending, [facility]

## 2022-08-07 ENCOUNTER — Telehealth: Payer: Self-pay | Admitting: *Deleted

## 2022-08-07 NOTE — Telephone Encounter (Signed)
Call from patient regarding on-going left side pelvic pain since prior to surgery last August. Reports pain did not improve with surgery. Has not been able to return to work. Saw Dr Elgie Congo who recommended she see general surgeon for further evaluation.  Pain in lower left side of abdomen to midline. Pain scale 7-8/10. Has taken Motrin with minimal relief. Denies fever. Denies vaginal discharge, pain or odor. Denies urinary burning or frequency but sometimes has left side pain with voiding.  Reports bowel function WNL.  Advised will review with providers for further advice.

## 2022-08-07 NOTE — Telephone Encounter (Signed)
Call back tp patient. Voice mail has number confirmation.  Left message the call reviewed by Joylene John, Np and agrees with recommendation from Dr Elgie Congo. Advised referral will need to come from his office since his recommendation and she has been cleared from [place] from surgical standpoint.  Left message to call back if additional questions.

## 2022-08-09 ENCOUNTER — Other Ambulatory Visit (HOSPITAL_COMMUNITY): Payer: Self-pay

## 2022-08-09 ENCOUNTER — Other Ambulatory Visit: Payer: Self-pay | Admitting: Internal Medicine

## 2022-08-09 ENCOUNTER — Other Ambulatory Visit: Payer: Self-pay

## 2022-08-09 DIAGNOSIS — M62838 Other muscle spasm: Secondary | ICD-10-CM

## 2022-08-09 DIAGNOSIS — R0602 Shortness of breath: Secondary | ICD-10-CM

## 2022-08-09 DIAGNOSIS — R1030 Lower abdominal pain, unspecified: Secondary | ICD-10-CM

## 2022-08-09 DIAGNOSIS — I10 Essential (primary) hypertension: Secondary | ICD-10-CM

## 2022-08-09 DIAGNOSIS — G629 Polyneuropathy, unspecified: Secondary | ICD-10-CM

## 2022-08-09 MED ORDER — CYCLOBENZAPRINE HCL 10 MG PO TABS
5.0000 mg | ORAL_TABLET | Freq: Three times a day (TID) | ORAL | 0 refills | Status: DC | PRN
  Filled 2022-08-09 – 2022-08-29 (×3): qty 40, 27d supply, fill #0

## 2022-08-09 MED ORDER — MELOXICAM 7.5 MG PO TABS
7.5000 mg | ORAL_TABLET | Freq: Every day | ORAL | 0 refills | Status: DC
  Filled 2022-08-09 – 2022-11-30 (×5): qty 30, 30d supply, fill #0

## 2022-08-09 MED ORDER — CETIRIZINE HCL 10 MG PO TABS
10.0000 mg | ORAL_TABLET | Freq: Every day | ORAL | 0 refills | Status: DC
  Filled 2022-08-09 – 2023-02-05 (×6): qty 30, 30d supply, fill #0

## 2022-08-09 MED ORDER — AMLODIPINE BESYLATE 10 MG PO TABS
10.0000 mg | ORAL_TABLET | Freq: Every day | ORAL | 0 refills | Status: DC
  Filled 2022-08-09 – 2022-08-22 (×2): qty 30, 30d supply, fill #0

## 2022-08-09 NOTE — Telephone Encounter (Signed)
Requested Prescriptions  Pending Prescriptions Disp Refills   amLODipine (NORVASC) 10 MG tablet 30 tablet 0    Sig: Take 1 tablet (10 mg total) by mouth daily.     Cardiovascular: Calcium Channel Blockers 2 Failed - 08/09/2022  7:03 AM      Failed - Last BP in normal range    BP Readings from Last 1 Encounters:  07/26/22 (!) 147/97         Failed - Valid encounter within last 6 months    Recent Outpatient Visits           6 months ago Pre-operative clearance   [place] Ladell Pier, MD   9 months ago Lower abdominal pain   [place], [place], [place]   1 year ago Neuropathy   [place], [place]   1 year ago Left hip pain   [place], [place], [place]   2 years ago Need for influenza vaccination   [place], RPH-CPP       Future Appointments             In 2 weeks Ladell Pier, MD [place]            Passed - Last Heart Rate in normal range    Pulse Readings from Last 1 Encounters:  07/26/22 92          cetirizine (ZYRTEC) 10 MG tablet 30 tablet 0    Sig: Take 1 tablet (10 mg total) by mouth daily.     Ear, Nose, and Throat:  Antihistamines 2 Failed - 08/09/2022  7:03 AM      Failed - Cr in normal range and within 360 days    Creatinine, Ser  Date Value Ref Range Status  07/09/2022 1.16 (H) 0.44 - 1.00 mg/dL Final         Passed - Valid encounter within last 12 months    Recent Outpatient Visits           6 months ago Pre-operative clearance   [place], MD   9 months ago Lower abdominal pain   [place], [place], [place]   1 year ago Neuropathy   [place], [place]   1 year ago Left hip pain   [place], [place], [place]   2 years ago Need for influenza vaccination   [place], RPH-CPP       Future Appointments             In 2 weeks Ladell Pier, MD [place]             meloxicam (MOBIC) 7.5 MG tablet 30 tablet 0    Sig: Take 1 tablet by mouth daily as needed for pain     Analgesics:  COX2 Inhibitors Failed - 08/09/2022  7:03 AM      Failed - Manual Review: Labs are only required if the patient has taken medication for more than 8 weeks.      Failed - Cr  in normal range and within 360 days    Creatinine, Ser  Date Value Ref Range Status  07/09/2022 1.16 (H) 0.44 - 1.00 mg/dL Final         Passed - HGB in normal range and within 360 days    Hemoglobin  Date Value Ref Range Status  07/09/2022 13.5 12.0 - 15.0 g/dL Final  11/09/2021 13.5 11.1 - 15.9 g/dL Final         Passed - HCT in normal range and within 360 days    HCT  Date Value Ref Range Status  07/09/2022 41.3 36.0 - 46.0 % Final   Hematocrit  Date Value Ref Range Status  11/09/2021 38.5 34.0 - 46.6 % Final         Passed - AST in normal range and within 360 days    AST  Date Value Ref Range Status  07/09/2022 37 15 - 41 U/L Final         Passed - ALT in normal range and within 360 days    ALT  Date Value Ref Range Status  07/09/2022 43 0 - 44 U/L Final         Passed - eGFR is 30 or above and within 360 days    GFR calc Af Amer  Date Value Ref Range Status  08/08/2016 >60 >60 mL/min Final    Comment:    (NOTE) The eGFR has been calculated using the CKD EPI equation. This calculation has not been validated in all clinical situations. eGFR's persistently <60 mL/min signify possible Chronic Kidney Disease.    GFR, Estimated  Date Value Ref Range Status  07/09/2022 59 (L)  >60 mL/min Final    Comment:    (NOTE) Calculated using the CKD-EPI Creatinine Equation (2021)    eGFR  Date Value Ref Range Status  02/06/2022 71 >59 mL/min/1.73 Final         Passed - Patient is not pregnant      Passed - Valid encounter within last 12 months    Recent Outpatient Visits           6 months ago Pre-operative clearance   [place] Ladell Pier, MD   9 months ago Lower abdominal pain   [place], [place], [place]   1 year ago Neuropathy   [place], [place]   1 year ago Left hip pain   [place], [place], [place]   2 years ago Need for influenza vaccination   [place], [place]ome [place], RPH-CPP       Future Appointments             In 2 weeks Ladell Pier, MD [place]             cyclobenzaprine (FLEXERIL) 10 MG tablet 40 tablet 0    Sig: Take 1/2 tablet (5 mg total) by mouth 3 (three) times daily as needed for muscle spasms. *Must keep upcoming appt for additional refills*     Not Delegated - Analgesics:  Muscle Relaxants Failed - 08/09/2022  7:03 AM      Failed - This refill cannot be delegated      Failed - Valid encounter within last 6 months    Recent Outpatient Visits  6 months ago Pre-operative clearance   [place], MD   9 months ago Lower abdominal pain   [place], [place], [place]   1 year ago Neuropathy   [place], [place]   1 year ago Left hip pain   [place], [place], [place]   2 years ago Need for influenza vaccination   [place], RPH-CPP       Future Appointments             In 2 weeks Ladell Pier, MD [place]

## 2022-08-09 NOTE — Telephone Encounter (Signed)
Requested medication (s) are due for refill today:yes  Requested medication (s) are on the active medication list: yes    Last refill: 07/12/22  #40  0 refills  Future visit scheduled yes 08/28/22  Notes to clinic:Not delegated, please review. Thank you.  Requested Prescriptions  Pending Prescriptions Disp Refills   cyclobenzaprine (FLEXERIL) 10 MG tablet 40 tablet 0    Sig: Take 1/2 tablet (5 mg total) by mouth 3 (three) times daily as needed for muscle spasms. *Must keep upcoming appt for additional refills*     Not Delegated - Analgesics:  Muscle Relaxants Failed - 08/09/2022  7:03 AM      Failed - This refill cannot be delegated      Failed - Valid encounter within last 6 months    Recent Outpatient Visits           6 months ago Pre-operative clearance   [place] Ladell Pier, MD   9 months ago Lower abdominal pain   [place], [place], [place]   1 year ago Neuropathy   [place], [place]   1 year ago Left hip pain   [place], [place], [place]   2 years ago Need for influenza vaccination   [place], RPH-CPP       Future Appointments             In 2 weeks Ladell Pier, MD [place]            Signed Prescriptions Disp Refills   amLODipine (NORVASC) 10 MG tablet 30 tablet 0    Sig: Take 1 tablet (10 mg total) by mouth daily.     Cardiovascular: Calcium Channel Blockers 2 Failed - 08/09/2022  7:03 AM      Failed - Last BP in normal range    BP Readings from Last 1 Encounters:  07/26/22 (!) 147/97         Failed - Valid encounter within last 6 months    Recent Outpatient Visits           6 months ago Pre-operative clearance   [place] Ladell Pier, MD   9 months ago Lower abdominal pain   [place], [place], [place]   1 year ago Neuropathy   [place], [place]   1 year ago Left hip pain   [place], [place], [place]   2 years ago Need for influenza vaccination   [place], RPH-CPP       Future Appointments             In 2 weeks Ladell Pier, MD [place]            Passed - Last Heart Rate in normal range    Pulse Readings from Last 1 Encounters:  07/26/22 92          cetirizine (ZYRTEC) 10 MG tablet 30 tablet 0    Sig: Take 1 tablet (10 mg total) by mouth daily.     Ear, Nose, and  Throat:  Antihistamines 2 Failed - 08/09/2022  7:03 AM      Failed - Cr in normal range and within 360 days    Creatinine, Ser  Date Value Ref Range Status  07/09/2022 1.16 (H) 0.44 - 1.00 mg/dL Final         Passed - Valid encounter within last 12 months    Recent Outpatient Visits           6 months ago Pre-operative clearance   [place], MD   9 months ago Lower abdominal pain   [place], [place], [place]   1 year ago Neuropathy   [place], [place]   1 year ago Left hip pain   [place], [place], [place]   2 years ago Need for influenza vaccination   [place], RPH-CPP       Future Appointments             In 2 weeks Ladell Pier, MD [place]             meloxicam (MOBIC) 7.5 MG tablet 30 tablet 0    Sig: Take 1 tablet by mouth daily as needed for pain     Analgesics:  COX2  Inhibitors Failed - 08/09/2022  7:03 AM      Failed - Manual Review: Labs are only required if the patient has taken medication for more than 8 weeks.      Failed - Cr in normal range and within 360 days    Creatinine, Ser  Date Value Ref Range Status  07/09/2022 1.16 (H) 0.44 - 1.00 mg/dL Final         Passed - HGB in normal range and within 360 days    Hemoglobin  Date Value Ref Range Status  07/09/2022 13.5 12.0 - 15.0 g/dL Final  11/09/2021 13.5 11.1 - 15.9 g/dL Final         Passed - HCT in normal range and within 360 days    HCT  Date Value Ref Range Status  07/09/2022 41.3 36.0 - 46.0 % Final   Hematocrit  Date Value Ref Range Status  11/09/2021 38.5 34.0 - 46.6 % Final         Passed - AST in normal range and within 360 days    AST  Date Value Ref Range Status  07/09/2022 37 15 - 41 U/L Final         Passed - ALT in normal range and within 360 days    ALT  Date Value Ref Range Status  07/09/2022 43 0 - 44 U/L Final         Passed - eGFR is 30 or above and within 360 days    GFR calc Af Amer  Date Value Ref Range Status  08/08/2016 >60 >60 mL/min Final    Comment:    (NOTE) The eGFR has been calculated using the CKD EPI equation. This calculation has not been validated in all clinical situations. eGFR's persistently <60 mL/min signify possible Chronic Kidney Disease.    GFR, Estimated  Date Value Ref Range Status  07/09/2022 59 (L) >60 mL/min Final    Comment:    (NOTE) Calculated using the CKD-EPI Creatinine Equation (2021)  eGFR  Date Value Ref Range Status  02/06/2022 71 >59 mL/min/1.73 Final         Passed - Patient is not pregnant      Passed - Valid encounter within last 12 months    Recent Outpatient Visits           6 months ago Pre-operative clearance   [place], MD   9 months ago Lower abdominal pain   [place], [place],  [place]   1 year ago Neuropathy   [place], [place]   1 year ago Left hip pain   [place], [place], [place]   2 years ago Need for influenza vaccination   [place], RPH-CPP       Future Appointments             In 2 weeks Ladell Pier, MD [place]

## 2022-08-10 ENCOUNTER — Encounter (HOSPITAL_COMMUNITY): Payer: Self-pay

## 2022-08-10 ENCOUNTER — Other Ambulatory Visit (HOSPITAL_COMMUNITY): Payer: Self-pay

## 2022-08-14 ENCOUNTER — Other Ambulatory Visit: Payer: Self-pay

## 2022-08-22 ENCOUNTER — Other Ambulatory Visit (HOSPITAL_COMMUNITY): Payer: Self-pay

## 2022-08-22 ENCOUNTER — Other Ambulatory Visit: Payer: Self-pay

## 2022-08-24 ENCOUNTER — Other Ambulatory Visit (HOSPITAL_COMMUNITY): Payer: Self-pay

## 2022-08-24 ENCOUNTER — Encounter (HOSPITAL_COMMUNITY): Payer: Self-pay | Admitting: Pharmacist

## 2022-08-28 ENCOUNTER — Encounter: Payer: Self-pay | Admitting: Internal Medicine

## 2022-08-28 ENCOUNTER — Ambulatory Visit: Payer: Commercial Managed Care - HMO | Attending: Internal Medicine | Admitting: Internal Medicine

## 2022-08-28 ENCOUNTER — Other Ambulatory Visit: Payer: Self-pay

## 2022-08-28 ENCOUNTER — Telehealth: Payer: Self-pay | Admitting: Internal Medicine

## 2022-08-28 VITALS — BP 152/94 | HR 94 | Wt 254.4 lb

## 2022-08-28 DIAGNOSIS — F17210 Nicotine dependence, cigarettes, uncomplicated: Secondary | ICD-10-CM | POA: Diagnosis not present

## 2022-08-28 DIAGNOSIS — Z79899 Other long term (current) drug therapy: Secondary | ICD-10-CM | POA: Insufficient documentation

## 2022-08-28 DIAGNOSIS — M79641 Pain in right hand: Secondary | ICD-10-CM | POA: Diagnosis not present

## 2022-08-28 DIAGNOSIS — I10 Essential (primary) hypertension: Secondary | ICD-10-CM | POA: Diagnosis not present

## 2022-08-28 DIAGNOSIS — F17200 Nicotine dependence, unspecified, uncomplicated: Secondary | ICD-10-CM

## 2022-08-28 DIAGNOSIS — Z8679 Personal history of other diseases of the circulatory system: Secondary | ICD-10-CM | POA: Insufficient documentation

## 2022-08-28 DIAGNOSIS — E669 Obesity, unspecified: Secondary | ICD-10-CM | POA: Diagnosis not present

## 2022-08-28 DIAGNOSIS — R1030 Lower abdominal pain, unspecified: Secondary | ICD-10-CM | POA: Insufficient documentation

## 2022-08-28 DIAGNOSIS — G8929 Other chronic pain: Secondary | ICD-10-CM | POA: Diagnosis not present

## 2022-08-28 DIAGNOSIS — Z6841 Body Mass Index (BMI) 40.0 and over, adult: Secondary | ICD-10-CM | POA: Insufficient documentation

## 2022-08-28 DIAGNOSIS — F329 Major depressive disorder, single episode, unspecified: Secondary | ICD-10-CM | POA: Insufficient documentation

## 2022-08-28 DIAGNOSIS — Z23 Encounter for immunization: Secondary | ICD-10-CM

## 2022-08-28 DIAGNOSIS — M25562 Pain in left knee: Secondary | ICD-10-CM | POA: Diagnosis not present

## 2022-08-28 DIAGNOSIS — R1032 Left lower quadrant pain: Secondary | ICD-10-CM

## 2022-08-28 DIAGNOSIS — R1031 Right lower quadrant pain: Secondary | ICD-10-CM

## 2022-08-28 DIAGNOSIS — M25511 Pain in right shoulder: Secondary | ICD-10-CM | POA: Diagnosis not present

## 2022-08-28 DIAGNOSIS — Z599 Problem related to housing and economic circumstances, unspecified: Secondary | ICD-10-CM

## 2022-08-28 DIAGNOSIS — M79651 Pain in right thigh: Secondary | ICD-10-CM | POA: Diagnosis not present

## 2022-08-28 DIAGNOSIS — Z1211 Encounter for screening for malignant neoplasm of colon: Secondary | ICD-10-CM

## 2022-08-28 DIAGNOSIS — F320 Major depressive disorder, single episode, mild: Secondary | ICD-10-CM

## 2022-08-28 MED ORDER — AMLODIPINE BESYLATE 10 MG PO TABS
10.0000 mg | ORAL_TABLET | Freq: Every day | ORAL | 4 refills | Status: DC
  Filled 2022-08-28 – 2022-11-30 (×5): qty 30, 30d supply, fill #0

## 2022-08-28 MED ORDER — VALSARTAN 40 MG PO TABS
40.0000 mg | ORAL_TABLET | Freq: Every day | ORAL | 3 refills | Status: DC
  Filled 2022-08-28 – 2022-09-02 (×2): qty 30, 30d supply, fill #0
  Filled 2022-09-05 – 2022-11-30 (×3): qty 30, 30d supply, fill #1

## 2022-08-28 MED ORDER — DULOXETINE HCL 20 MG PO CPEP
20.0000 mg | ORAL_CAPSULE | Freq: Every day | ORAL | 3 refills | Status: DC
  Filled 2022-08-28 – 2022-11-30 (×5): qty 30, 30d supply, fill #0

## 2022-08-28 NOTE — Telephone Encounter (Signed)
Patient is calling in because she needs transportation to her appointment today. Patient wasn't aware that you had to contact 48 hours in advance for transportation services. Please advise patient.

## 2022-08-28 NOTE — Progress Notes (Signed)
Patient ID: Lisa Haley, female    DOB: 1975/12/01  MRN: [id number]  CC: chronic ds management   Subjective: Lisa Haley is a 47 y.o. female who presents for annual exam Her concerns today include:  Patient with history of subarachnoid hemorrhage 2021, tob dep, HTN, obese  HTN:  takes Norvasc 10 mg daily Checks BP at [facility] 2x/wk where she donates.  States it is okay but does not recall the numbers She limits salt in the foods No SOB or CP.  Tob dep: smoking 4 cigarettes/day.  Would like to quit but under too much stress at this time.    She had left ovary removed in the summer of last year.  Pathology was benign.  Still gets pain in the left lower abdomen that sometimes radiates across the entire lower abdomen.  She has seen GYN in follow-up for this.  Nothing further from their standpoint.  Had CT scan abdomen through ER 06/2022.  This revealed some thickening of the short segment of the small bowel wall questionably infectious or inflammation.  She has never had a colonoscopy. Chronic pain in LT knee worse over past mth.  No injury.  Swells at times. Swelling and pain RT hand, dorsal surface swollen and ring finger x 1 mth.   Pain upper RT arm around the shoulder to the mid bicep/tricep muscles. Reports being told she had a hairline fx of RT upper arm in 2016.  Pain since then.  Feels she has no strength in the right arm to lift anything. 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  Her boyfriend is currently out of work having had one of his toes amputated recently.  She spends most of her time taking care of him and doing dressing changes.  She feels depressed.  No suicidal ideation.  Obesity:  has decrease food intact.  Not walking as much since helping to take care of her BF.  Also pain LT lower abdomen persists and this  interferes with her desire to walk.   Patient Active Problem List   Diagnosis Date Noted   Pelvic pain in female 07/26/2022   Adnexal mass 01/24/2022   BMI 45.0-49.9, adult ([place]) 01/24/2022   History of stroke 01/24/2022   Neuropathy 04/21/2021   Left hip pain 04/21/2021   SOB (shortness of breath) 04/21/2021   Migraine without aura and without status migrainosus, not intractable 08/06/2020   Essential hypertension 08/06/2020   Tobacco dependence 08/06/2020   Class 3 severe obesity due to excess calories with serious comorbidity and body mass index (BMI) of 45.0 to 49.9 in adult [facility]) 08/06/2020   SAH (subarachnoid hemorrhage) ([place]) 07/06/2020     Current Outpatient Medications on File Prior to Visit  Medication Sig Dispense Refill   albuterol (VENTOLIN HFA) 108 (90 Base) MCG/ACT inhaler Inhale 2 puffs into the lungs every 6 (six) hours as needed for wheezing or shortness of breath. 8.5 g 0   amLODipine (NORVASC) 10 MG tablet Take 1 tablet (10 mg total) by mouth daily. 30 tablet 0   cetirizine (ZYRTEC) 10 MG tablet Take 1 tablet (10 mg total) by mouth daily. 30 tablet 0   cyclobenzaprine (FLEXERIL) 10 MG tablet Take 0.5 tablets (5 mg total) by mouth 3 (three) times daily as needed for muscle spasms. 40 tablet  0   dexamethasone (DECADRON) 1 MG tablet Take 1 tab at 11 p.m night before coming to Cortisol level draw the following morning. (Patient not taking: Reported on 07/26/2022) 1 tablet 0   gabapentin (NEURONTIN) 300 MG capsule Take 1 capsule (300 mg total) by mouth at bedtime. 90 capsule 2   meloxicam (MOBIC) 7.5 MG tablet Take 1 tablet (7.5 mg total) by mouth daily. 30 tablet 0   oxyCODONE (OXY IR/ROXICODONE) 5 MG immediate release tablet Take 1 tablet (5 mg total) by mouth every 4 (four) hours as needed for severe pain. (Patient not taking: Reported on 07/26/2022) 5 tablet 0   senna-docusate (SENOKOT-S) 8.6-50 MG tablet Take 2 tablets by mouth at bedtime. For AFTER surgery, do not take  if having diarrhea (Patient not taking: Reported on 07/26/2022) 30 tablet 0   No current facility-administered medications on file prior to visit.    No Known Allergies  Social History   Socioeconomic History   Marital status: Single    Spouse name: Not on file   Number of children: Not on file   Years of education: Not on file   Highest education level: Not on file  Occupational History   Not on file  Tobacco Use   Smoking status: Every Day    Packs/day: 0.25    Years: 20.00    Total pack years: 5.00    Types: Cigarettes   Smokeless tobacco: Never  Vaping Use   Vaping Use: Never used  Substance and Sexual Activity   Alcohol use: Yes    Comment: once a week   Drug use: Yes    Types: Marijuana    Comment: 3-4x week   Sexual activity: Yes    Birth control/protection: Surgical    Comment: btl  Other Topics Concern   Not on file  Social History Narrative   Not on file   Social Determinants of Health   Financial Resource Strain: High Risk (02/07/2022)   Overall Financial Resource Strain (CARDIA)    Difficulty of Paying Living Expenses: Very hard  Food Insecurity: Food Insecurity Present (07/26/2022)   Hunger Vital Sign    Worried About Running Out of Food in the Last Year: Sometimes true    Ran Out of Food in the Last Year: Sometimes true  Transportation Needs: Unmet Transportation Needs (07/26/2022)   PRAPARE - Hydrologist (Medical): Yes    Lack of Transportation (Non-Medical): Yes  Physical Activity: Not on file  Stress: Not on file  Social Connections: Not on file  Intimate Partner Violence: Not on file    Family History  Problem Relation Age of Onset   Ovarian cancer Neg Hx    Uterine cancer Neg Hx    Breast cancer Neg Hx     Past Surgical History:  Procedure Laterality Date   ABDOMINAL HYSTERECTOMY     in 13s for heavy bleeding, RSO   IR ANGIO INTRA EXTRACRAN SEL INTERNAL CAROTID BILAT MOD SED  07/06/2020   IR ANGIO INTRA  EXTRACRAN SEL INTERNAL CAROTID BILAT MOD SED  07/12/2020   IR ANGIO VERTEBRAL SEL VERTEBRAL BILAT MOD SED  07/06/2020   IR ANGIO VERTEBRAL SEL VERTEBRAL BILAT MOD SED  07/12/2020   RADIOLOGY WITH ANESTHESIA N/A 07/06/2020   Procedure: IR WITH ANESTHESIA;  Surgeon: Consuella Lose, MD;  Location: [facility];  Service: Radiology;  Laterality: N/A;   ROBOTIC ASSISTED BILATERAL SALPINGO OOPHERECTOMY N/A 02/14/2022   Procedure: XI ROBOTIC ASSISTED UNILATERAL SALPINGO OOPHORECTOMY,  LYSIS OF ADHESIONS, LEFT URETEROLYSIS;  Surgeon: Lafonda Mosses, MD;  Location: [facility];  Service: Gynecology;  Laterality: N/A;   TUBAL LIGATION     WRIST SURGERY Left    for carpal tunnel    ROS: Review of Systems Negative except as stated above  PHYSICAL EXAM: BP (!) 152/94   Wt 254 lb 6.4 oz (115.4 kg)   SpO2 94%   BMI 46.53 kg/m   Wt Readings from Last 3 Encounters:  08/28/22 254 lb 6.4 oz (115.4 kg)  07/26/22 253 lb 3.2 oz (114.9 kg)  03/15/22 245 lb (111.1 kg)  156/100  Physical Exam  General appearance - alert, well appearing, morbidly obese middle-age African-American female and in no distress Mental status - normal mood, behavior, speech, dress, motor activity, and thought processes Neck - supple, no significant adenopathy Chest - clear to auscultation, no wheezes, rales or rhonchi, symmetric air entry Heart - normal rate, regular rhythm, normal S1, S2, no murmurs, rubs, clicks or gallops Musculoskeletal -right shoulder: No point tenderness.  Mild discomfort with passive range of motion in all directions.  Drop arm test negative. Right hand: Mild swelling of the MCP joints for the index and middle finger.  Mild swelling of the middle finger.  No point tenderness on palpation of the joints.  Grip 4/5 on the right, 5/5 left.  Left knee: Large body habitus.  Slight point tenderness of the medial and lateral joint lines.  Mild discomfort with passive range of motion.  No crepitus  appreciated. Extremities -no lower extremity edema.     07/26/2022    3:05 PM 02/03/2022   11:02 AM 11/09/2021    2:38 PM  Depression screen PHQ 2/9  Decreased Interest 1 2 0  Down, Depressed, Hopeless 0 1 1  PHQ - 2 Score 1 3 1  $ Altered sleeping 2 0 0  Tired, decreased energy 1 2 2  $ Change in appetite 1 0 1  Feeling bad or failure about yourself  0 0 2  Trouble concentrating 2 0 0  Moving slowly or fidgety/restless 0 0 0  Suicidal thoughts 0 0 0  PHQ-9 Score 7 5 6       $ Latest Ref Rng & Units 07/09/2022   11:00 AM 05/09/2022    4:38 PM 02/14/2022    6:43 AM  CMP  Glucose 70 - 99 mg/dL 118   85   BUN 6 - 20 mg/dL 10   15   Creatinine 0.44 - 1.00 mg/dL 1.16  1.30  0.95   Sodium 135 - 145 mmol/L 140   138   Potassium 3.5 - 5.1 mmol/L 3.4   5.4   Chloride 98 - 111 mmol/L 110   110   CO2 22 - 32 mmol/L 24   20   Calcium 8.9 - 10.3 mg/dL 8.2   8.7   Total Protein 6.5 - 8.1 g/dL 5.5     Total Bilirubin 0.3 - 1.2 mg/dL 0.5     Alkaline Phos 38 - 126 U/L 52     AST 15 - 41 U/L 37     ALT 0 - 44 U/L 43      Lipid Panel     Component Value Date/Time   TRIG 80 07/11/2020 0030    CBC    Component Value Date/Time   WBC 5.5 07/09/2022 1100   RBC 4.29 07/09/2022 1100   HGB 13.5 07/09/2022 1100   HGB 13.5 11/09/2021 1511   HCT 41.3 07/09/2022 1100  HCT 38.5 11/09/2021 1511   PLT 267 07/09/2022 1100   PLT 275 11/09/2021 1511   MCV 96.3 07/09/2022 1100   MCV 94 11/09/2021 1511   MCH 31.5 07/09/2022 1100   MCHC 32.7 07/09/2022 1100   RDW 14.2 07/09/2022 1100   RDW 12.9 11/09/2021 1511   LYMPHSABS 1.7 07/09/2022 1100   LYMPHSABS 2.1 11/09/2021 1511   MONOABS 0.6 07/09/2022 1100   EOSABS 0.2 07/09/2022 1100   EOSABS 0.5 (H) 11/09/2021 1511   BASOSABS 0.0 07/09/2022 1100   BASOSABS 0.1 11/09/2021 1511    ASSESSMENT AND PLAN:  1. Essential hypertension Not at goal.  Continue Norvasc 10 mg daily. Add valsartan 40 mg daily.  Follow-up with clinical pharmacist in 4  weeks for repeat blood pressure check. - Basic Metabolic Panel; Future - valsartan (DIOVAN) 40 MG tablet; Take 1 tablet (40 mg total) by mouth daily.  Dispense: 30 tablet; Refill: 3 - amLODipine (NORVASC) 10 MG tablet; Take 1 tablet (10 mg total) by mouth daily.  Dispense: 30 tablet; Refill: 4  2. Tobacco dependence Strongly advised to quit.  3. Morbid obesity ([place]) Patient advised to eliminate sugary drinks from the diet, cut back on portion sizes especially of white carbohydrates, eat more white lean meat like chicken [place] and seafood instead of beef or pork and incorporate fresh fruits and vegetables into the diet daily. Encouraged her to move is much as she can  4. Pain of right hand We will screen for rheumatoid arthritis and get an x-ray of the hand.  Given her chronic polyarthritis symptoms, and mild depression, we discussed putting her on low-dose Cymbalta.  Informed patient that this is a medication used to treat depression and anxiety but also his use for arthritis pain.  She is agreeable to trying the medication.  Went over possible side effects.  If any worsening depression she should let us know. - DG Hand Complete Right; Future - DULoxetine (CYMBALTA) 20 MG capsule; Take 1 capsule (20 mg total) by mouth daily.  Dispense: 30 capsule; Refill: 3 - Rheumatoid factor; Future - CYCLIC CITRUL PEPTIDE ANTIBODY, IGG/IGA; Future  5. Chronic pain of left knee Strongly encouraged weight loss through changes in eating habits. See #4 above concerning discussion about Cymbalta - DG Knee Complete 4 Views Left; Future - DULoxetine (CYMBALTA) 20 MG capsule; Take 1 capsule (20 mg total) by mouth daily.  Dispense: 30 capsule; Refill: 3  6. Chronic right shoulder pain See #4 above concerning discussion about Cymbalta. - DG Shoulder Right; Future - DULoxetine (CYMBALTA) 20 MG capsule; Take 1 capsule (20 mg total) by mouth daily.  Dispense: 30 capsule; Refill: 3  7. Chronic bilateral lower  abdominal pain Persistent pain despite removal of left ovarian mass.  Repeat CAT scan with no concerning abnormality in the lower abdomen.  Will refer to GI.  She is due for colon cancer screening anyway.  Patient agreeable to referral - Ambulatory referral to Gastroenterology  8. Mild major depression ([place]) Denies any suicidal ideation. Will try her with low-dose Cymbalta.  9. Screening for colon cancer - Ambulatory referral to Gastroenterology  10. Financial difficulties   11. Need for immunization against influenza - Flu Vaccine QUAD 92moIM (Fluarix, Fluzone & Alfiuria Quad PF)    Patient was given the opportunity to ask questions.  Patient verbalized understanding of the plan and was able to repeat key elements of the plan.   This documentation was completed using DRadio producer  Any transcriptional errors are  unintentional.  No orders of the defined types were placed in this encounter.    Requested Prescriptions    No prescriptions requested or ordered in this encounter    No follow-ups on file.  Karle Plumber, MD, FACP

## 2022-08-28 NOTE — Patient Instructions (Signed)
Your BP is elevated.  Continue amlodipine 10 mg. We have added another blood pressure medication called Valsartan to get your blood pressure better.  We have referred you to orthopedics for your knee and shoulder.

## 2022-08-28 NOTE — Telephone Encounter (Signed)
Transportation scheduled for patient's appointment today at 4:00 pm . Patient advised to be ready for pick-up at her residence  by 3:15 pm. Transportation request faxed to [employer]. Fax confirmed at 11:20 am on 08/28/2022.

## 2022-08-29 ENCOUNTER — Encounter (HOSPITAL_COMMUNITY): Payer: Self-pay

## 2022-08-29 ENCOUNTER — Other Ambulatory Visit: Payer: Self-pay

## 2022-08-29 ENCOUNTER — Other Ambulatory Visit (HOSPITAL_COMMUNITY): Payer: Self-pay

## 2022-08-31 ENCOUNTER — Encounter (HOSPITAL_COMMUNITY): Payer: Self-pay

## 2022-08-31 ENCOUNTER — Other Ambulatory Visit (HOSPITAL_COMMUNITY): Payer: Self-pay

## 2022-09-01 ENCOUNTER — Other Ambulatory Visit: Payer: Self-pay

## 2022-09-01 ENCOUNTER — Telehealth: Payer: Self-pay

## 2022-09-01 NOTE — Telephone Encounter (Unsigned)
Copied from [place] [phone]. Topic: Transportation - Transportation >> Sep 01, 2022 10:11 AM [place] B wrote: Reason for CRM: Patient called needs ride for appt on 02/27

## 2022-09-02 ENCOUNTER — Other Ambulatory Visit: Payer: Self-pay | Admitting: Physician Assistant

## 2022-09-02 DIAGNOSIS — R0602 Shortness of breath: Secondary | ICD-10-CM

## 2022-09-04 ENCOUNTER — Other Ambulatory Visit: Payer: Self-pay

## 2022-09-04 ENCOUNTER — Other Ambulatory Visit (HOSPITAL_COMMUNITY): Payer: Self-pay

## 2022-09-04 MED ORDER — ALBUTEROL SULFATE HFA 108 (90 BASE) MCG/ACT IN AERS
2.0000 | INHALATION_SPRAY | Freq: Four times a day (QID) | RESPIRATORY_TRACT | 0 refills | Status: DC | PRN
  Filled 2022-09-04: qty 8.5, 25d supply, fill #0
  Filled 2023-04-02 – 2023-04-19 (×5): qty 6.7, 20d supply, fill #0
  Filled 2023-05-11: qty 6.7, 25d supply, fill #0
  Filled 2023-07-12 – 2023-07-16 (×3): qty 6.7, 20d supply, fill #0
  Filled 2023-07-16: qty 6.7, 25d supply, fill #0

## 2022-09-04 NOTE — Telephone Encounter (Signed)
Requested Prescriptions  Pending Prescriptions Disp Refills   albuterol (VENTOLIN HFA) 108 (90 Base) MCG/ACT inhaler 8.5 g 0    Sig: Inhale 2 puffs into the lungs every 6 (six) hours as needed for wheezing or shortness of breath.     Pulmonology:  Beta Agonists 2 Failed - 09/02/2022  6:08 PM      Failed - Last BP in normal range    BP Readings from Last 1 Encounters:  08/28/22 (!) 152/94         Passed - Last Heart Rate in normal range    Pulse Readings from Last 1 Encounters:  08/28/22 94         Passed - Valid encounter within last 12 months    Recent Outpatient Visits           1 week ago Essential hypertension   [place], MD   7 months ago Pre-operative clearance   [place], MD   9 months ago Lower abdominal pain   [place], [place], [place]   1 year ago Neuropathy   [place], [place]   1 year ago Left hip pain   [place], [place], [place]       Future Appointments             In 4 weeks Daisy Blossom, Jarome [place]atin, [place]   In 3 months Wynetta Emery, Dalbert Batman, MD [place]

## 2022-09-05 ENCOUNTER — Other Ambulatory Visit: Payer: Self-pay | Admitting: Internal Medicine

## 2022-09-05 ENCOUNTER — Other Ambulatory Visit: Payer: Medicaid Other

## 2022-09-05 ENCOUNTER — Ambulatory Visit
Admission: RE | Admit: 2022-09-05 | Discharge: 2022-09-05 | Disposition: A | Discharge status: Home / Self Care | Payer: Medicaid Other | Source: Ambulatory Visit | Attending: Internal Medicine | Admitting: Internal Medicine

## 2022-09-05 DIAGNOSIS — R1030 Lower abdominal pain, unspecified: Secondary | ICD-10-CM

## 2022-09-05 DIAGNOSIS — M62838 Other muscle spasm: Secondary | ICD-10-CM

## 2022-09-05 DIAGNOSIS — M79641 Pain in right hand: Secondary | ICD-10-CM

## 2022-09-05 DIAGNOSIS — G8929 Other chronic pain: Secondary | ICD-10-CM

## 2022-09-05 DIAGNOSIS — G629 Polyneuropathy, unspecified: Secondary | ICD-10-CM

## 2022-09-06 ENCOUNTER — Encounter (HOSPITAL_COMMUNITY): Payer: Self-pay

## 2022-09-06 ENCOUNTER — Other Ambulatory Visit (HOSPITAL_COMMUNITY): Payer: Self-pay

## 2022-09-06 ENCOUNTER — Other Ambulatory Visit: Payer: Self-pay | Admitting: Internal Medicine

## 2022-09-06 ENCOUNTER — Other Ambulatory Visit: Payer: Medicaid Other

## 2022-09-06 ENCOUNTER — Other Ambulatory Visit: Payer: Self-pay

## 2022-09-06 DIAGNOSIS — G8929 Other chronic pain: Secondary | ICD-10-CM

## 2022-09-06 DIAGNOSIS — M19211 Secondary osteoarthritis, right shoulder: Secondary | ICD-10-CM

## 2022-09-06 MED ORDER — CYCLOBENZAPRINE HCL 10 MG PO TABS
5.0000 mg | ORAL_TABLET | Freq: Three times a day (TID) | ORAL | 0 refills | Status: DC | PRN
  Filled 2022-09-06 – 2023-07-16 (×12): qty 40, 27d supply, fill #0

## 2022-09-06 NOTE — Telephone Encounter (Signed)
Requested medication (s) are due for refill today: Yes  Requested medication (s) are on the active medication list: Yes  Last refill:  08/09/22  Future visit scheduled: Yes  Notes to clinic:  See request.    Requested Prescriptions  Pending Prescriptions Disp Refills   cyclobenzaprine (FLEXERIL) 10 MG tablet 40 tablet 0    Sig: Take 0.5 tablets (5 mg total) by mouth 3 (three) times daily as needed for muscle spasms.     Not Delegated - Analgesics:  Muscle Relaxants Failed - 09/05/2022 11:44 PM      Failed - This refill cannot be delegated      Passed - Valid encounter within last 6 months    Recent Outpatient Visits           1 week ago Essential hypertension   [place], MD   7 months ago Pre-operative clearance   [place], MD   10 months ago Lower abdominal pain   [place], [place], [place]   1 year ago Neuropathy   [place], [place]   1 year ago Left hip pain   [place], [place], [place]       Future Appointments             In 3 weeks Daisy Blossom, Jarome Matin, [place]   In 3 months Wynetta Emery, Dalbert Batman, MD [place]

## 2022-09-07 ENCOUNTER — Other Ambulatory Visit (HOSPITAL_COMMUNITY): Payer: Self-pay

## 2022-09-12 ENCOUNTER — Other Ambulatory Visit: Payer: Self-pay

## 2022-09-14 ENCOUNTER — Ambulatory Visit: Payer: Commercial Managed Care - HMO | Admitting: Orthopaedic Surgery

## 2022-09-21 ENCOUNTER — Other Ambulatory Visit: Payer: Self-pay

## 2022-09-21 ENCOUNTER — Ambulatory Visit: Payer: Commercial Managed Care - HMO | Admitting: Orthopaedic Surgery

## 2022-09-21 NOTE — Progress Notes (Signed)
Patient outreached by Estelle June, PharmD Candidate on 09/21/22 to discuss hypertension    Patient does not have an automated home blood pressure machine. He states he cannot afford a cuff since he is out of work due to a knee injury.    Medication review was performed. They are taking medications as prescribed.   The following barriers to adherence were noted:  - They do have cost concerns. Patient is out of work and is having trouble affording things.  - They do have transportation concerns due to knee injury. They cannot get around in and out of the house  - They do not need assistance obtaining refills.  - They do not occasionally forget to take some of their prescribed medications.  - They do not feel like one/some of their medications make them feel poorly.  - They do not have questions or concerns about their medications.  - They do have follow up scheduled with their primary care provider/cardiologist.   The following interventions were completed:  - Medications were reviewed  - Patient was educated on goal blood pressures and long term health implications of elevated blood pressure  - Patient was educated on how to access home blood pressure machine  - Patient was counseled on lifestyle modifications to improve blood pressure, including using canned good as weight in her arms since she is unable to use her legs due to knee injury. Patient also states she does not use salt in her foods, and was recommended to keep avoiding salt  - Patient was counseled on benefit of tobacco cessation. Patient currently smokes 5 cigarettes per day (down from 1 ppd) which she attributes to not being able to go out and buy more cigarettes due to her injury and financial instability. Patient stated she wants to quit smoking and has discussed a nicotine patch with her PCP. States that her PCP wants to control her BP first before attempting NRT.   The patient has follow up scheduled:  PCP: Karle Plumber 01/01/23    Estelle June, PharmD Candidate 2026  [facility]   Maryan Puls, PharmD PGY-1 [facility] Resident

## 2022-09-22 ENCOUNTER — Ambulatory Visit (INDEPENDENT_AMBULATORY_CARE_PROVIDER_SITE_OTHER): Payer: Commercial Managed Care - HMO | Admitting: Physician Assistant

## 2022-09-22 ENCOUNTER — Encounter: Payer: Self-pay | Admitting: Physician Assistant

## 2022-09-22 DIAGNOSIS — M25562 Pain in left knee: Secondary | ICD-10-CM

## 2022-09-22 DIAGNOSIS — M25511 Pain in right shoulder: Secondary | ICD-10-CM | POA: Diagnosis not present

## 2022-09-22 MED ORDER — BUPIVACAINE HCL 0.25 % IJ SOLN
2.0000 mL | INTRAMUSCULAR | Status: AC | PRN
  Administered 2022-09-22: 2 mL via INTRA_ARTICULAR

## 2022-09-22 MED ORDER — LIDOCAINE HCL 1 % IJ SOLN
2.0000 mL | INTRAMUSCULAR | Status: AC | PRN
  Administered 2022-09-22: 2 mL

## 2022-09-22 MED ORDER — METHYLPREDNISOLONE ACETATE 40 MG/ML IJ SUSP
80.0000 mg | INTRAMUSCULAR | Status: AC | PRN
  Administered 2022-09-22: 80 mg via INTRA_ARTICULAR

## 2022-09-22 NOTE — Progress Notes (Signed)
Office Visit Note   Patient: Lisa Haley           Date of Birth: July 08, 1976           MRN: [id number] Visit Date: 09/22/2022              Requested by: Ladell Pier, MD [place] [place],  [place] [id number] PCP: Ladell Pier, MD   Assessment & Plan: Visit Diagnoses:  1. Acute pain of right shoulder   2. Acute pain of left knee     Plan: Patient is a 47 year old woman who comes in for 2 issues today.  First she wants to be evaluated for left knee pain x 2 months.  She says she gets swelling and it feels tight.  She complains of pain all the time.  She did have x-rays done which did not show any acute changes or significant degenerative changes.  Did question whether she had an effusion.  She has not had any recent fever or chills but has been caring for her husband a lot since has had surgery.  I do think she has a moderate effusion today.  This may be calling it causing some of the tightness she complains of in her knee.  She said she would try a steroid injection but does not want me to attempt aspiration.  Will go forward with that today.  Also think she would benefit from physical therapy but she says she just cannot do that right now With regards to her right shoulder she does relate a history of having a fracture in the several years ago.  Since then she has had increasing problems with the shoulder and does not want to out forward elevate it very high because it just hurts too much also pain deep with external rotation of the shoulder.  X-rays show some degenerative changes and a calcification within the joint.  I discussed with her following up with Lurena Joiner next week for a steroid injection under ultrasound guidance she is willing to do that.  I also again thinks I am concerned that her loss of motion in the shoulder she would do well with physical therapy but again she has declined this.  She is not supposed take anti-inflammatories because of some GI  issues  Follow-Up Instructions: Return [facility] for shoulder.   Orders:  No orders of the defined types were placed in this encounter.  No orders of the defined types were placed in this encounter.     Procedures: Large Joint Inj: L knee on 09/22/2022 10:44 AM Indications: pain and diagnostic evaluation Details: 25 G 1.5 in needle  Arthrogram: No  Medications: 2 mL lidocaine 1 %; 2 mL bupivacaine 0.25 %; 80 mg methylPREDNISolone acetate 40 MG/ML Outcome: tolerated well, no immediate complications Procedure, treatment alternatives, risks and benefits explained, specific risks discussed. Consent was given by the patient.       Clinical Data: No additional findings.   Subjective: Chief Complaint  Patient presents with   Right Shoulder - Pain   Left Knee - Pain    HPI 47 year old woman presents today with a history of right shoulder pain times years following a fracture.  Also complaining of left knee pain and swelling x 2 months no particular injury denies any fever chills Review of Systems   Objective: Vital Signs: There were no vitals taken for this visit.  Physical Exam Constitutional:      Appearance: Normal appearance.  Pulmonary:  Effort: Pulmonary effort is normal.  Skin:    General: Skin is warm and dry.  Neurological:     General: No focal deficit present.     Mental Status: She is alert.     Ortho Exam Right shoulder: She has forward elevation to about 95 degrees and then resists secondary to pain.  She has pain with external rotation of her joint.  She has a negative empty can test negative speeds test.  She is neurovascular intact no radiation of symptoms down her arm Left knee she does have some warmth but no erythema.  She does have a palpable effusion.  There is no evidence of cellulitis compartments are soft and nontender.  She is globally tender over the knee medial laterally and over the patellofemoral joint she does have some grinding she is  able to sustain a straight leg raise though this is painful Specialty Comments:  No specialty comments available.  Imaging: No results found.   PMFS History: Patient Active Problem List   Diagnosis Date Noted   Pain in right shoulder 09/22/2022   Pain in left knee 09/22/2022   Pelvic pain in female 07/26/2022   Adnexal mass 01/24/2022   BMI 45.0-49.9, adult ([place]) 01/24/2022   History of stroke 01/24/2022   Neuropathy 04/21/2021   Left hip pain 04/21/2021   SOB (shortness of breath) 04/21/2021   Migraine without aura and without status migrainosus, not intractable 08/06/2020   Essential hypertension 08/06/2020   Tobacco dependence 08/06/2020   Class 3 severe obesity due to excess calories with serious comorbidity and body mass index (BMI) of 45.0 to 49.9 in adult [facility]) 08/06/2020   SAH (subarachnoid hemorrhage) ([place]) 07/06/2020   Past Medical History:  Diagnosis Date   Anemia    after hysterectomy   Arthritis    Chronic hypertension    GERD (gastroesophageal reflux disease)    Hypertension    Obesity    Preeclampsia    Stroke ([place]) 2021   SAH, admitted at [facility] 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    Family History  Problem Relation Age of Onset   Ovarian cancer Neg Hx    Uterine cancer Neg Hx    Breast cancer Neg Hx     Past Surgical History:  Procedure Laterality Date   ABDOMINAL HYSTERECTOMY     in 65s for heavy bleeding, RSO   IR ANGIO INTRA EXTRACRAN SEL INTERNAL CAROTID BILAT MOD SED  07/06/2020   IR ANGIO INTRA EXTRACRAN SEL INTERNAL CAROTID BILAT MOD SED  07/12/2020   IR ANGIO VERTEBRAL SEL VERTEBRAL BILAT MOD SED  07/06/2020   IR ANGIO VERTEBRAL SEL VERTEBRAL BILAT MOD SED  07/12/2020   RADIOLOGY WITH ANESTHESIA N/A 07/06/2020   Procedure: IR WITH ANESTHESIA;  Surgeon: Consuella Lose, MD;  Location: [place];  Service: Radiology;  Laterality: N/A;   ROBOTIC ASSISTED BILATERAL SALPINGO OOPHERECTOMY N/A 02/14/2022   Procedure: XI ROBOTIC ASSISTED UNILATERAL SALPINGO  OOPHORECTOMY, LYSIS OF ADHESIONS, LEFT URETEROLYSIS;  Surgeon: Lafonda Mosses, MD;  Location[facility];  Service: Gynecology;  Laterality: N/A;   TUBAL LIGATION     WRIST SURGERY Left    for carpal tunnel   Social History   Occupational History   Not on file  Tobacco Use   Smoking status: Every Day    Packs/day: 0.25    Years: 20.00    Additional pack years: 0.00    Total pack years: 5.00    Types: Cigarettes   Smokeless tobacco: Never  Vaping Use  Vaping Use: Never used  Substance and Sexual Activity   Alcohol use: Yes    Comment: once a week   Drug use: Yes    Types: Marijuana    Comment: 3-4x week   Sexual activity: Yes    Birth control/protection: Surgical    Comment: btl

## 2022-09-27 ENCOUNTER — Other Ambulatory Visit: Payer: Self-pay

## 2022-09-27 ENCOUNTER — Encounter: Payer: Self-pay | Admitting: Surgical

## 2022-09-27 ENCOUNTER — Ambulatory Visit (INDEPENDENT_AMBULATORY_CARE_PROVIDER_SITE_OTHER): Payer: Commercial Managed Care - HMO | Admitting: Surgical

## 2022-09-27 DIAGNOSIS — M25562 Pain in left knee: Secondary | ICD-10-CM

## 2022-09-27 DIAGNOSIS — M25511 Pain in right shoulder: Secondary | ICD-10-CM

## 2022-09-27 DIAGNOSIS — M19011 Primary osteoarthritis, right shoulder: Secondary | ICD-10-CM

## 2022-09-29 ENCOUNTER — Encounter: Payer: Self-pay | Admitting: Surgical

## 2022-09-29 MED ORDER — BUPIVACAINE HCL 0.5 % IJ SOLN
9.0000 mL | INTRAMUSCULAR | Status: AC | PRN
  Administered 2022-09-27: 9 mL via INTRA_ARTICULAR

## 2022-09-29 MED ORDER — LIDOCAINE HCL 1 % IJ SOLN
5.0000 mL | INTRAMUSCULAR | Status: AC | PRN
  Administered 2022-09-27: 5 mL

## 2022-09-29 MED ORDER — METHYLPREDNISOLONE ACETATE 40 MG/ML IJ SUSP
40.0000 mg | INTRAMUSCULAR | Status: AC | PRN
  Administered 2022-09-27: 40 mg via INTRA_ARTICULAR

## 2022-09-29 NOTE — Progress Notes (Signed)
Office Visit Note   Patient: Lisa Haley           Date of Birth: Jun 30, 1976           MRN: [id number] Visit Date: 09/27/2022 Requested by: Lisa Pier, MD [place],   [id number] PCP: Lisa Pier, MD  Subjective: Chief Complaint  Patient presents with   Right Shoulder - Pain    HPI: Lisa Haley is a 47 y.o. female who presents to the office reporting right shoulder pain.  Patient states that she has had pain for years since 2016.  She states that she sustained a humerus fracture that was treated nonoperatively.  This is healed but she has had some persistent pain since this fracture relating to decreased shoulder range of motion and persistent pain.  She denies any numbness consistently but does have occasional tingling in her fingers.  No neck pain.  She has taken ibuprofen and Tylenol without much relief.  She has never had physical therapy, surgery, injection for her shoulder.  No history of dislocation.  She has recently seen Lisa Haley Persons PA-C who referred her here for glenohumeral injection.  No fevers or chills.              ROS: All systems reviewed are negative as they relate to the chief complaint within the history of present illness.  Patient denies fevers or chills.  Assessment & Plan: Visit Diagnoses:  1. Arthritis of right glenohumeral joint   2. Acute pain of right shoulder     Plan: Patient is a 47 year old female who presents for evaluation of right shoulder pain.  She has history of right shoulder chronic pain that has stemmed from prior humerus fracture by her history.  She has radiographs from seeing Lisa Haley demonstrating early degenerative changes of the glenohumeral joint.  She has stiffness of the shoulder joint on exam consistent with early arthritis.  She has pain with passive motion of the shoulder.  She has been referred for glenohumeral injection today.  Under ultrasound guidance, cortisone  injection was successfully delivered into the right glenohumeral joint.  She tolerated procedure well.  Plan for her to follow-up with the office as needed.  If she has good relief from this injection, we could consider repeating this in the future 1-2 times a year.  Follow-Up Instructions: Return if symptoms worsen or fail to improve.   Orders:  Orders Placed This Encounter  Procedures   US Guided Needle Placement - No Linked Charges   No orders of the defined types were placed in this encounter.     Procedures: Large Joint Inj: R glenohumeral on 09/27/2022 12:57 PM Indications: diagnostic evaluation and pain Details: 22 G 1.5 in and 3.5 in needle, ultrasound-guided posterior approach  Arthrogram: No  Medications: 9 mL bupivacaine 0.5 %; 40 mg methylPREDNISolone acetate 40 MG/ML; 5 mL lidocaine 1 % Outcome: tolerated well, no immediate complications Procedure, treatment alternatives, risks and benefits explained, specific risks discussed. Consent was given by the patient. Immediately prior to procedure a time out was called to verify the correct patient, procedure, equipment, support staff and site/side marked as required. Patient was prepped and draped in the usual sterile fashion.       Clinical Data: No additional findings.  Objective: Vital Signs: There were no vitals taken for this visit.  Physical Exam:  Constitutional: Patient appears well-developed HEENT:  Head: Normocephalic Eyes:EOM are normal Neck: Normal range of motion Cardiovascular:  Normal rate Pulmonary/chest: Effort normal Neurologic: Patient is alert Skin: Skin is warm Psychiatric: Patient has normal mood and affect  Ortho Exam: Ortho exam demonstrates right shoulder with 35 degrees X rotation, 90 degrees abduction, 120 degrees forward elevation passively and actively.  Axillary nerve intact with deltoid firing.  Intact supra, infra, subscap strength rated 5/5.  2+ radial pulse of the right upper  extremity.  No cellulitis or skin changes noted to the right shoulder.  Specialty Comments:  No specialty comments available.  Imaging: No results found.   PMFS History: Patient Active Problem List   Diagnosis Date Noted   Pain in right shoulder 09/22/2022   Pain in left knee 09/22/2022   Pelvic pain in female 07/26/2022   Adnexal mass 01/24/2022   BMI 45.0-49.9, adult ([place]) 01/24/2022   History of stroke 01/24/2022   Neuropathy 04/21/2021   Left hip pain 04/21/2021   SOB (shortness of breath) 04/21/2021   Migraine without aura and without status migrainosus, not intractable 08/06/2020   Essential hypertension 08/06/2020   Tobacco dependence 08/06/2020   Class 3 severe obesity due to excess calories with serious comorbidity and body mass index (BMI) of 45.0 to 49.9 in adult [facility]) 08/06/2020   SAH (subarachnoid hemorrhage) ([place]) 07/06/2020   Past Medical History:  Diagnosis Date   Anemia    after hysterectomy   Arthritis    Chronic hypertension    GERD (gastroesophageal reflux disease)    Hypertension    Obesity    Preeclampsia    Stroke ([place]) 2021   SAH, admitted at [facility] 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    Family History  Problem Relation Age of Onset   Ovarian cancer Neg Hx    Uterine cancer Neg Hx    Breast cancer Neg Hx     Past Surgical History:  Procedure Laterality Date   ABDOMINAL HYSTERECTOMY     in 54s for heavy bleeding, RSO   IR ANGIO INTRA EXTRACRAN SEL INTERNAL CAROTID BILAT MOD SED  07/06/2020   IR ANGIO INTRA EXTRACRAN SEL INTERNAL CAROTID BILAT MOD SED  07/12/2020   IR ANGIO VERTEBRAL SEL VERTEBRAL BILAT MOD SED  07/06/2020   IR ANGIO VERTEBRAL SEL VERTEBRAL BILAT MOD SED  07/12/2020   RADIOLOGY WITH ANESTHESIA N/A 07/06/2020   Procedure: IR WITH ANESTHESIA;  Surgeon: Lisa Lose, MD;  Location: [place];  Service: Radiology;  Laterality: N/A;   ROBOTIC ASSISTED BILATERAL SALPINGO OOPHERECTOMY N/A 02/14/2022   Procedure: XI ROBOTIC ASSISTED UNILATERAL  SALPINGO OOPHORECTOMY, LYSIS OF ADHESIONS, LEFT URETEROLYSIS;  Surgeon: Lisa Mosses, MD;  [facility];  Service: Gynecology;  Laterality: N/A;   TUBAL LIGATION     WRIST SURGERY Left    for carpal tunnel   Social History   Occupational History   Not on file  Tobacco Use   Smoking status: Every Day    Packs/day: 0.25    Years: 20.00    Additional pack years: 0.00    Total pack years: 5.00    Types: Cigarettes   Smokeless tobacco: Never  Vaping Use   Vaping Use: Never used  Substance and Sexual Activity   Alcohol use: Yes    Comment: once a week   Drug use: Yes    Types: Marijuana    Comment: 3-4x week   Sexual activity: Yes    Birth control/protection: Surgical    Comment: btl

## 2022-09-30 LAB — CBC WITH DIFFERENTIAL/PLATELET
Absolute Monocytes: 684 cells/uL (ref 200–950)
Basophils Absolute: 50 cells/uL (ref 0–200)
Basophils Relative: 0.7 %
Eosinophils Absolute: 317 cells/uL (ref 15–500)
Eosinophils Relative: 4.4 %
HCT: 37.3 % (ref 35.0–45.0)
Hemoglobin: 12.4 g/dL (ref 11.7–15.5)
Lymphs Abs: 2434 cells/uL (ref 850–3900)
MCH: 31.6 pg (ref 27.0–33.0)
MCHC: 33.2 g/dL (ref 32.0–36.0)
MCV: 95.2 fL (ref 80.0–100.0)
MPV: 9.3 fL (ref 7.5–12.5)
Monocytes Relative: 9.5 %
Neutro Abs: 3715 cells/uL (ref 1500–7800)
Neutrophils Relative %: 51.6 %
Platelets: 322 10*3/uL (ref 140–400)
RBC: 3.92 10*6/uL (ref 3.80–5.10)
RDW: 13.6 % (ref 11.0–15.0)
Total Lymphocyte: 33.8 %
WBC: 7.2 10*3/uL (ref 3.8–10.8)

## 2022-09-30 LAB — RHEUMATOID FACTOR: Rheumatoid fact SerPl-aCnc: <14 IU/mL (ref <14)

## 2022-09-30 LAB — C-REACTIVE PROTEIN: CRP: 6 mg/L (ref <8.0)

## 2022-09-30 LAB — ANA: Anti Nuclear Antibody (ANA): NEGATIVE

## 2022-09-30 LAB — SEDIMENTATION RATE: Sed Rate: 6 mm/h (ref 0–20)

## 2022-09-30 LAB — CYCLIC CITRUL PEPTIDE ANTIBODY, IGG: Cyclic Citrullin Peptide Ab: <16 UNITS

## 2022-10-02 ENCOUNTER — Ambulatory Visit: Payer: Medicaid Other | Admitting: Pharmacist

## 2022-10-05 NOTE — Progress Notes (Signed)
Attempted to call to discuss labs. VM full

## 2022-10-11 ENCOUNTER — Ambulatory Visit: Payer: Commercial Managed Care - HMO | Admitting: Surgical

## 2022-10-12 ENCOUNTER — Other Ambulatory Visit: Payer: Self-pay

## 2022-10-13 ENCOUNTER — Ambulatory Visit: Payer: Self-pay | Admitting: Gastroenterology

## 2022-10-16 ENCOUNTER — Other Ambulatory Visit (HOSPITAL_COMMUNITY): Payer: Self-pay

## 2022-10-16 ENCOUNTER — Other Ambulatory Visit: Payer: Self-pay

## 2022-10-17 ENCOUNTER — Other Ambulatory Visit (HOSPITAL_COMMUNITY): Payer: Self-pay

## 2022-10-17 ENCOUNTER — Encounter (HOSPITAL_COMMUNITY): Payer: Self-pay

## 2022-10-18 ENCOUNTER — Other Ambulatory Visit: Payer: Self-pay

## 2022-10-20 ENCOUNTER — Other Ambulatory Visit (INDEPENDENT_AMBULATORY_CARE_PROVIDER_SITE_OTHER): Payer: Commercial Managed Care - HMO

## 2022-10-20 ENCOUNTER — Ambulatory Visit (INDEPENDENT_AMBULATORY_CARE_PROVIDER_SITE_OTHER): Payer: Commercial Managed Care - HMO | Admitting: Physician Assistant

## 2022-10-20 ENCOUNTER — Encounter: Payer: Self-pay | Admitting: Physician Assistant

## 2022-10-20 DIAGNOSIS — M25551 Pain in right hip: Secondary | ICD-10-CM | POA: Diagnosis not present

## 2022-10-20 DIAGNOSIS — M25562 Pain in left knee: Secondary | ICD-10-CM

## 2022-10-20 DIAGNOSIS — M25561 Pain in right knee: Secondary | ICD-10-CM | POA: Diagnosis not present

## 2022-10-20 MED ORDER — BUPIVACAINE HCL 0.25 % IJ SOLN
2.0000 mL | INTRAMUSCULAR | Status: AC | PRN
Start: 2022-10-20 — End: 2022-10-20
  Administered 2022-10-20: 2 mL via INTRA_ARTICULAR

## 2022-10-20 MED ORDER — LIDOCAINE HCL 1 % IJ SOLN
2.0000 mL | INTRAMUSCULAR | Status: AC | PRN
Start: 2022-10-20 — End: 2022-10-20
  Administered 2022-10-20: 2 mL

## 2022-10-20 MED ORDER — METHYLPREDNISOLONE ACETATE 40 MG/ML IJ SUSP
80.0000 mg | INTRAMUSCULAR | Status: AC | PRN
Start: 2022-10-20 — End: 2022-10-20
  Administered 2022-10-20: 80 mg via INTRA_ARTICULAR

## 2022-10-20 NOTE — Progress Notes (Signed)
Office Visit Note   Patient: Lisa Haley           Date of Birth: [birth year]           MRN: [id number] Visit Date: 10/20/2022              Requested by: Marcine Matar, MD [address] [place],  [place] [address] PCP: Marcine Matar, MD  Chief Complaint  Patient presents with  . Right Knee - Pain  . Left Knee - Pain      HPI: Lisa Haley comes in today with 2 complaints.  She is complaining of left knee pain.  No injury.  Also complaining of right hip pain.  With regards to the left knee she recently had a right knee injection with me and did have some relief in her symptoms.  She denies any mechanical symptoms in her left knee.  Denies any falls.  On her right hip again no injury she says it hurts in her right hip to walk or sit or specially lay on this side.  Denies any groin pain.  She cannot take anti-inflammatories because she has some kidney disease.  Assessment & Plan: Visit Diagnoses:  1. Acute pain of both knees   2. 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  Hurts her a lot when she is going up and down stairs.  Despite getting minimal results of the right knee with an inch cortisone injection she like to go forward with a cortisone injection into the left knee I will go do with that today.  I do think at this point trying some physical therapy would be helpful to her to strengthen her VMO.  She is willing to try this we will refer her there.  With regards to her right hip she does not have any radicular symptoms and pain seems to be directly over the trochanteric bursa.  I will refer her to Dr. Shon Baton to see if he feels a trochanteric bursa injection would be helpful to her.  Given her multiple joint complaints I do think she needs to follow-up with Dr. August Saucer or Franky Macho who she seen in the past.    Follow-Up Instructions: No follow-ups on file.   Ortho Exam  Patient is alert, oriented, no  adenopathy, well-dressed, normal affect, normal respiratory effort. Left knee she has no effusion no erythema compartments are soft and compressible good dorsiflexion plantarflexion no real tenderness around the joint line.  Does have some tenderness with compression of the patellofemoral joint.  Is able to hold a straight leg raise. Right hip she has no tenderness in her groin no tenderness in the posterior buttock or back.  She does have some lateral tenderness focally that does not radiate.  No paresthesias her strength is intact pain is mostly surrounding the trochanteric bursa  Imaging: XR HIP UNILAT W OR W/O PELVIS 2-3 VIEWS RIGHT  Result Date: 10/20/2022 Pelvis right hip films were taken today well reduced femoral head and the acetabulum.  Some sclerotic changes.  No acute fractures noted.  Study is limited by habitus  XR KNEE 3 VIEW RIGHT  Result Date: 10/20/2022 Three-view radiographs of her right knee were taken today she discussed some early degenerative changes but overall well-preserved joint spacing no acute fractures  No images are attached to the encounter.  Labs: Lab Results  Component Value Date   HGBA1C 5.3  07/07/2020   ESRSEDRATE 6 09/27/2022   CRP 6.0 09/27/2022   REPTSTATUS 11/21/2013 FINAL 11/19/2013   CULT  11/19/2013    ESCHERICHIA COLI Performed at [facility] ESCHERICHIA COLI 11/19/2013     Lab Results  Component Value Date   ALBUMIN 2.9 (L) 07/09/2022   ALBUMIN 3.9 02/02/2022   ALBUMIN 3.9 11/09/2021    No results found for: "MG" No results found for: "VD25OH"  No results found for: "PREALBUMIN"    Latest Ref Rng & Units 09/27/2022    9:35 AM 07/09/2022   11:00 AM 02/02/2022   10:16 AM  CBC EXTENDED  WBC 3.8 - 10.8 Thousand/uL 7.2  5.5  6.6   RBC 3.80 - 5.10 Million/uL 3.92  4.29  4.35   Hemoglobin 11.7 - 15.5 g/dL 81.1  91.4  78.2   HCT 35.0 - 45.0 % 37.3  41.3  42.5   Platelets 140 - 400 Thousand/uL 322  267  297   NEUT#  1,500 - 7,800 cells/uL 3,715  3.1    Lymph# 850 - 3,900 cells/uL 2,434  1.7       There is no height or weight on file to calculate BMI.  Orders:  Orders Placed This Encounter  Procedures  . XR KNEE 3 VIEW RIGHT  . XR HIP UNILAT W OR W/O PELVIS 2-3 VIEWS RIGHT  . Ambulatory referral to Physical Therapy  . Ambulatory referral to Orthopedic Surgery   No orders of the defined types were placed in this encounter.    Procedures: Large Joint Inj on 10/20/2022 11:54 AM Indications: pain and diagnostic evaluation Details: 25 G 1.5 in needle, anteromedial approach  Arthrogram: No  Medications: 80 mg methylPREDNISolone acetate 40 MG/ML; 2 mL lidocaine 1 %; 2 mL bupivacaine 0.25 % Outcome: tolerated well, no immediate complications Procedure, treatment alternatives, risks and benefits explained, specific risks discussed. Consent was given by the patient.    Clinical Data: No additional findings.  ROS:  All other systems negative, except as noted in the HPI. Review of Systems  Objective: Vital Signs: There were no vitals taken for this visit.  Specialty Comments:  No specialty comments available.  PMFS History: Patient Active Problem List   Diagnosis Date Noted  . Pain in right shoulder 09/22/2022  . Pain in left knee 09/22/2022  . Pelvic pain in female 07/26/2022  . Adnexal mass 01/24/2022  . BMI 45.0-49.9, adult 01/24/2022  . History of stroke 01/24/2022  . Neuropathy 04/21/2021  . Left hip pain 04/21/2021  . SOB (shortness of breath) 04/21/2021  . Migraine without aura and without status migrainosus, not intractable 08/06/2020  . Essential hypertension 08/06/2020  . Tobacco dependence 08/06/2020  . Class 3 severe obesity due to excess calories with serious comorbidity and body mass index (BMI) of 45.0 to 49.9 in adult 08/06/2020  . SAH (subarachnoid hemorrhage) 07/06/2020   Past Medical History:  Diagnosis Date  . Anemia    after hysterectomy  . Arthritis   .  Chronic hypertension   . GERD (gastroesophageal reflux disease)   . Hypertension   . Obesity   . Preeclampsia   . Stroke 2021   [facility], admitted at [facility] 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    Family History  Problem Relation Age of Onset  . Ovarian cancer Neg Hx   . Uterine cancer Neg Hx   . Breast cancer Neg Hx     Past Surgical History:  Procedure Laterality Date  . ABDOMINAL HYSTERECTOMY  in 30s for heavy bleeding, RSO  . IR ANGIO INTRA EXTRACRAN SEL INTERNAL CAROTID BILAT MOD SED  07/06/2020  . IR ANGIO INTRA EXTRACRAN SEL INTERNAL CAROTID BILAT MOD SED  07/12/2020  . IR ANGIO VERTEBRAL SEL VERTEBRAL BILAT MOD SED  07/06/2020  . IR ANGIO VERTEBRAL SEL VERTEBRAL BILAT MOD SED  07/12/2020  . RADIOLOGY WITH ANESTHESIA N/A 07/06/2020   Procedure: IR WITH ANESTHESIA;  Surgeon: Lisbeth Renshaw, MD;  Location: [facility];  Service: Radiology;  Laterality: N/A;  . ROBOTIC ASSISTED BILATERAL SALPINGO OOPHERECTOMY N/A 02/14/2022   Procedure: XI ROBOTIC ASSISTED UNILATERAL SALPINGO OOPHORECTOMY, LYSIS OF ADHESIONS, LEFT URETEROLYSIS;  Surgeon: Carver Fila, MD;  [facility];  Service: Gynecology;  Laterality: N/A;  . TUBAL LIGATION    . WRIST SURGERY Left    for carpal tunnel   Social History   Occupational History  . Not on file  Tobacco Use  . Smoking status: Every Day    Packs/day: 0.25    Years: 20.00    Additional pack years: 0.00    Total pack years: 5.00    Types: Cigarettes  . Smokeless tobacco: Never  Vaping Use  . Vaping Use: Never used  Substance and Sexual Activity  . Alcohol use: Yes    Comment: once a week  . Drug use: Yes    Types: Marijuana    Comment: 3-4x week  . Sexual activity: Yes    Birth control/protection: Surgical    Comment: btl

## 2022-10-23 ENCOUNTER — Other Ambulatory Visit: Payer: Self-pay

## 2022-10-24 ENCOUNTER — Telehealth: Payer: Self-pay | Admitting: Physician Assistant

## 2022-10-24 NOTE — Telephone Encounter (Signed)
Asking for pain medication due to knee pain. Patient states she is starting P/T next week. Call into [facility] medical

## 2022-10-30 NOTE — Therapy (Unsigned)
OUTPATIENT PHYSICAL THERAPY LOWER EXTREMITY EVALUATION   Patient Name: Lisa Haley MRN: [id number] DOB:June 03, 1976, 47 y.o., female Today's Date: 10/30/2022  END OF SESSION:   Past Medical History:  Diagnosis Date   Anemia    after hysterectomy   Arthritis    Chronic hypertension    GERD (gastroesophageal reflux disease)    Hypertension    Obesity    Preeclampsia    Stroke 2021   [facility], admitted at [facility] 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   Past Surgical History:  Procedure Laterality Date   ABDOMINAL HYSTERECTOMY     in 30s for heavy bleeding, RSO   IR ANGIO INTRA EXTRACRAN SEL INTERNAL CAROTID BILAT MOD SED  07/06/2020   IR ANGIO INTRA EXTRACRAN SEL INTERNAL CAROTID BILAT MOD SED  07/12/2020   IR ANGIO VERTEBRAL SEL VERTEBRAL BILAT MOD SED  07/06/2020   IR ANGIO VERTEBRAL SEL VERTEBRAL BILAT MOD SED  07/12/2020   RADIOLOGY WITH ANESTHESIA N/A 07/06/2020   Procedure: IR WITH ANESTHESIA;  Surgeon: Lisbeth Renshaw, MD;  Location: [facility] OR;  Service: Radiology;  Laterality: N/A;   ROBOTIC ASSISTED BILATERAL SALPINGO OOPHERECTOMY N/A 02/14/2022   Procedure: XI ROBOTIC ASSISTED UNILATERAL SALPINGO OOPHORECTOMY, LYSIS OF ADHESIONS, LEFT URETEROLYSIS;  Surgeon: Carver Fila, MD;  Location: [facility] ORS;  Service: Gynecology;  Laterality: N/A;   TUBAL LIGATION     WRIST SURGERY Left    for carpal tunnel   Patient Active Problem List   Diagnosis Date Noted   Pain in right shoulder 09/22/2022   Pain in left knee 09/22/2022   Pelvic pain in female 07/26/2022   Adnexal mass 01/24/2022   BMI 45.0-49.9, adult 01/24/2022   History of stroke 01/24/2022   Neuropathy 04/21/2021   Left hip pain 04/21/2021   SOB (shortness of breath) 04/21/2021   Migraine without aura and without status migrainosus, not intractable 08/06/2020   Essential hypertension 08/06/2020   Tobacco dependence 08/06/2020   Class 3 severe obesity due to excess calories with serious comorbidity and body mass index (BMI)  of 45.0 to 49.9 in adult 08/06/2020   SAH (subarachnoid hemorrhage) 07/06/2020    PCP: ***  REFERRING PROVIDER: ***  REFERRING DIAG: ***  THERAPY DIAG:  No diagnosis found.  Rationale for Evaluation and Treatment: {HABREHAB:27488}  ONSET DATE: ***  SUBJECTIVE:   SUBJECTIVE STATEMENT: ***  PERTINENT HISTORY: *** PAIN:  Are you having pain? {OPRCPAIN:27236}  PRECAUTIONS: {Therapy precautions:24002}  WEIGHT BEARING RESTRICTIONS: {Yes ***/No:24003}  FALLS:  Has patient fallen in last 6 months? {fallsyesno:27318}  LIVING ENVIRONMENT: Lives with: {OPRC lives with:25569::"lives with their family"} Lives in: {Lives in:25570} Stairs: {opstairs:27293} Has following equipment at home: {Assistive devices:23999}  OCCUPATION: ***  PLOF: {PLOF:24004}  PATIENT GOALS: ***  NEXT MD VISIT: ***  OBJECTIVE:   DIAGNOSTIC FINDINGS: ***  PATIENT SURVEYS:  {rehab surveys:24030}  COGNITION: Overall cognitive status: {cognition:24006}     SENSATION: {sensation:27233}  EDEMA:  {edema:24020}  MUSCLE LENGTH: Hamstrings: Right *** deg; Left *** deg Thomas test: Right *** deg; Left *** deg  POSTURE: {posture:25561}  PALPATION: ***  LOWER EXTREMITY ROM:  {AROM/PROM:27142} ROM Right eval Left eval  Hip flexion    Hip extension    Hip abduction    Hip adduction    Hip internal rotation    Hip external rotation    Knee flexion    Knee extension    Ankle dorsiflexion    Ankle plantarflexion    Ankle inversion    Ankle eversion     (Blank rows =  not tested)  LOWER EXTREMITY MMT:  MMT Right eval Left eval  Hip flexion    Hip extension    Hip abduction    Hip adduction    Hip internal rotation    Hip external rotation    Knee flexion    Knee extension    Ankle dorsiflexion    Ankle plantarflexion    Ankle inversion    Ankle eversion     (Blank rows = not tested)  LOWER EXTREMITY SPECIAL TESTS:  {LEspecialtests:26242}  FUNCTIONAL TESTS:   {Functional tests:24029}  GAIT: Distance walked: *** Assistive device utilized: {Assistive devices:23999} Level of assistance: {Levels of assistance:24026} Comments: ***   TODAY'S TREATMENT:                                                                                                                              DATE: ***    PATIENT EDUCATION:  Education details: *** Person educated: {Person educated:25204} Education method: {Education Method:25205} Education comprehension: {Education Comprehension:25206}  HOME EXERCISE PROGRAM: ***  ASSESSMENT:  CLINICAL IMPRESSION: Patient is a *** y.o. *** who was seen today for physical therapy evaluation and treatment for ***.   OBJECTIVE IMPAIRMENTS: {opptimpairments:25111}.   ACTIVITY LIMITATIONS: {activitylimitations:27494}  PARTICIPATION LIMITATIONS: {participationrestrictions:25113}  PERSONAL FACTORS: {Personal factors:25162} are also affecting patient's functional outcome.   REHAB POTENTIAL: {rehabpotential:25112}  CLINICAL DECISION MAKING: {clinical decision making:25114}  EVALUATION COMPLEXITY: {Evaluation complexity:25115}   GOALS: Goals reviewed with patient? {yes/no:20286}  SHORT TERM GOALS: Target date: *** *** Baseline: Goal status: {GOALSTATUS:25110}  2.  *** Baseline:  Goal status: {GOALSTATUS:25110}  3.  *** Baseline:  Goal status: {GOALSTATUS:25110}  4.  *** Baseline:  Goal status: {GOALSTATUS:25110}  5.  *** Baseline:  Goal status: {GOALSTATUS:25110}  6.  *** Baseline:  Goal status: {GOALSTATUS:25110}  LONG TERM GOALS: Target date: ***  *** Baseline:  Goal status: {GOALSTATUS:25110}  2.  *** Baseline:  Goal status: {GOALSTATUS:25110}  3.  *** Baseline:  Goal status: {GOALSTATUS:25110}  4.  *** Baseline:  Goal status: {GOALSTATUS:25110}  5.  *** Baseline:  Goal status: {GOALSTATUS:25110}  6.  *** Baseline:  Goal status: {GOALSTATUS:25110}   PLAN:  PT  FREQUENCY: {rehab frequency:25116}  PT DURATION: {rehab duration:25117}  PLANNED INTERVENTIONS: {rehab planned interventions:25118::"Therapeutic exercises","Therapeutic activity","Neuromuscular re-education","Balance training","Gait training","Patient/Family education","Self Care","Joint mobilization"}  PLAN FOR NEXT SESSION: ***   Champ Mungo, PT 10/30/2022, 1:58 PM

## 2022-10-31 ENCOUNTER — Ambulatory Visit: Payer: Commercial Managed Care - HMO | Admitting: Physical Therapy

## 2022-11-16 ENCOUNTER — Ambulatory Visit: Payer: Commercial Managed Care - HMO | Admitting: Physician Assistant

## 2022-11-17 ENCOUNTER — Encounter: Payer: Commercial Managed Care - HMO | Admitting: Physician Assistant

## 2022-11-30 ENCOUNTER — Other Ambulatory Visit: Payer: Self-pay

## 2022-11-30 ENCOUNTER — Other Ambulatory Visit (HOSPITAL_COMMUNITY): Payer: Self-pay

## 2022-12-01 ENCOUNTER — Other Ambulatory Visit: Payer: Self-pay

## 2022-12-01 ENCOUNTER — Encounter: Payer: Self-pay | Admitting: Pharmacist

## 2022-12-07 ENCOUNTER — Other Ambulatory Visit: Payer: Self-pay

## 2022-12-18 NOTE — Progress Notes (Signed)
This encounter was created in error - please disregard.

## 2023-01-01 ENCOUNTER — Ambulatory Visit: Payer: Commercial Managed Care - HMO | Admitting: Internal Medicine

## 2023-01-01 DIAGNOSIS — Z5321 Procedure and treatment not carried out due to patient leaving prior to being seen by health care provider: Secondary | ICD-10-CM

## 2023-01-01 NOTE — Progress Notes (Signed)
Patient left before being seen.  She has rescheduled.

## 2023-01-22 ENCOUNTER — Telehealth: Payer: Self-pay | Admitting: Internal Medicine

## 2023-01-22 NOTE — Telephone Encounter (Signed)
Copied from CRM [id number]. Topic: Transportation - Transportation >> Jan 22, 2023  2:19 PM Dondra Prader E wrote: Reason for CRM: Pt called reporting that her insurance does not provide transportation. She is asking for the office to assist.   Best contact: [phone]

## 2023-01-24 ENCOUNTER — Ambulatory Visit: Payer: Commercial Managed Care - HMO | Attending: Physician Assistant | Admitting: Physician Assistant

## 2023-01-24 ENCOUNTER — Encounter: Payer: Self-pay | Admitting: Physician Assistant

## 2023-01-24 ENCOUNTER — Other Ambulatory Visit: Payer: Self-pay

## 2023-01-24 ENCOUNTER — Other Ambulatory Visit (HOSPITAL_COMMUNITY): Payer: Self-pay

## 2023-01-24 DIAGNOSIS — M79641 Pain in right hand: Secondary | ICD-10-CM

## 2023-01-24 DIAGNOSIS — I10 Essential (primary) hypertension: Secondary | ICD-10-CM | POA: Diagnosis not present

## 2023-01-24 DIAGNOSIS — G629 Polyneuropathy, unspecified: Secondary | ICD-10-CM

## 2023-01-24 DIAGNOSIS — G8929 Other chronic pain: Secondary | ICD-10-CM

## 2023-01-24 DIAGNOSIS — M25562 Pain in left knee: Secondary | ICD-10-CM

## 2023-01-24 DIAGNOSIS — M25511 Pain in right shoulder: Secondary | ICD-10-CM

## 2023-01-24 MED ORDER — VALSARTAN 40 MG PO TABS
40.0000 mg | ORAL_TABLET | Freq: Every day | ORAL | 3 refills | Status: DC
Start: 2023-01-24 — End: 2023-10-07
  Filled 2023-01-24 – 2023-02-05 (×3): qty 30, 30d supply, fill #0
  Filled 2023-03-17 – 2023-04-19 (×2): qty 30, 30d supply, fill #1
  Filled 2023-05-11: qty 90, 90d supply, fill #0
  Filled 2023-05-11: qty 30, 30d supply, fill #1
  Filled 2023-06-06 – 2023-07-12 (×3): qty 30, 30d supply, fill #0
  Filled 2023-07-16: qty 90, 90d supply, fill #0
  Filled 2023-07-16: qty 30, 30d supply, fill #0
  Filled 2023-08-11: qty 30, 30d supply, fill #1
  Filled 2023-09-12: qty 30, 30d supply, fill #2

## 2023-01-24 MED ORDER — GABAPENTIN 300 MG PO CAPS
300.0000 mg | ORAL_CAPSULE | Freq: Every day | ORAL | 2 refills | Status: DC
Start: 2023-01-24 — End: 2024-02-01
  Filled 2023-01-24: qty 30, 30d supply, fill #0
  Filled 2023-01-24 – 2023-02-05 (×2): qty 90, 90d supply, fill #0
  Filled 2023-04-02: qty 90, 90d supply, fill #1
  Filled 2023-04-16 – 2023-07-16 (×7): qty 90, 90d supply, fill #0
  Filled 2023-07-16: qty 30, 30d supply, fill #0
  Filled 2023-08-11: qty 30, 30d supply, fill #1
  Filled 2023-09-12: qty 30, 30d supply, fill #2
  Filled 2023-10-12: qty 30, 30d supply, fill #3
  Filled 2023-11-06: qty 30, 30d supply, fill #4
  Filled 2023-12-09: qty 30, 30d supply, fill #5

## 2023-01-24 MED ORDER — DULOXETINE HCL 20 MG PO CPEP
20.0000 mg | ORAL_CAPSULE | Freq: Every day | ORAL | 3 refills | Status: DC
Start: 2023-01-24 — End: 2023-10-07
  Filled 2023-01-24 – 2023-02-05 (×3): qty 30, 30d supply, fill #0
  Filled 2023-03-17 – 2023-04-19 (×2): qty 30, 30d supply, fill #1
  Filled 2023-05-11: qty 90, 90d supply, fill #0
  Filled 2023-05-11: qty 30, 30d supply, fill #1
  Filled 2023-06-06 – 2023-07-12 (×3): qty 30, 30d supply, fill #0
  Filled 2023-07-16: qty 90, 90d supply, fill #0
  Filled 2023-07-16: qty 30, 30d supply, fill #0
  Filled 2023-08-11: qty 30, 30d supply, fill #1
  Filled 2023-09-12: qty 30, 30d supply, fill #2

## 2023-01-24 MED ORDER — MELOXICAM 7.5 MG PO TABS
7.5000 mg | ORAL_TABLET | Freq: Every day | ORAL | 3 refills | Status: DC
Start: 2023-01-24 — End: 2023-11-07
  Filled 2023-01-24 – 2023-02-05 (×3): qty 30, 30d supply, fill #0
  Filled 2023-02-27 – 2023-08-11 (×8): qty 30, 30d supply, fill #1
  Filled 2023-09-12: qty 30, 30d supply, fill #2
  Filled 2023-10-12 – 2023-10-26 (×2): qty 30, 30d supply, fill #3
  Filled 2023-10-26: qty 30, 30d supply, fill #0

## 2023-01-24 MED ORDER — AMLODIPINE BESYLATE 10 MG PO TABS
10.0000 mg | ORAL_TABLET | Freq: Every day | ORAL | 4 refills | Status: DC
Start: 2023-01-24 — End: 2023-11-09
  Filled 2023-01-24: qty 90, 90d supply, fill #0
  Filled 2023-01-24 – 2023-02-05 (×2): qty 30, 30d supply, fill #0
  Filled 2023-03-17 – 2023-04-19 (×3): qty 30, 30d supply, fill #1
  Filled 2023-05-11: qty 30, 30d supply, fill #0
  Filled 2023-05-11: qty 30, 30d supply, fill #1
  Filled 2023-06-06 – 2023-07-16 (×5): qty 30, 30d supply, fill #0
  Filled 2023-08-11: qty 30, 30d supply, fill #1
  Filled 2023-09-12: qty 30, 30d supply, fill #2
  Filled 2023-10-12: qty 30, 30d supply, fill #3

## 2023-01-24 NOTE — Progress Notes (Signed)
Patient ID: Jia Dottavio, female   DOB: Feb 20, 1976, 47 y.o.   MRN: [id number]   Jorgia Manthei, is a 47 y.o. female  [id number]  GMW:[id number]  DOB - Jul 11, 1975  Chief Complaint  Patient presents with   Knee Pain       Subjective:   Ahnna Dungan is a 47 y.o. female here today for med RF.  Out of all of her meds bc she has not had the copays to get them.  L knee pain but is supposed to be followed by ortho but has not been back since 10/2022.  Wants to apply for disabilty.  Wants pain management referral.  Denies HA/CP/dizziness.    No problems updated.  ALLERGIES: No Known Allergies  PAST MEDICAL HISTORY: Past Medical History:  Diagnosis Date   Anemia    after hysterectomy   Arthritis    Chronic hypertension    GERD (gastroesophageal reflux disease)    Hypertension    Obesity    Preeclampsia    Stroke (HCC) 2021   SAH, admitted at [facility] 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    MEDICATIONS AT HOME: Prior to Admission medications   Medication Sig Start Date End Date Taking? Authorizing Provider  albuterol (VENTOLIN HFA) 108 (90 Base) MCG/ACT inhaler Inhale 2 puffs into the lungs every 6 (six) hours as needed for wheezing or shortness of breath. Patient not taking: Reported on 01/24/2023 09/04/22   Marcine Matar, MD  amLODipine (NORVASC) 10 MG tablet Take 1 tablet (10 mg total) by mouth daily. 01/24/23   Anders Simmonds, PA-C  cetirizine (ZYRTEC) 10 MG tablet Take 1 tablet (10 mg total) by mouth daily. Patient not taking: Reported on 01/24/2023 08/09/22   Marcine Matar, MD  cyclobenzaprine (FLEXERIL) 10 MG tablet Take 0.5 tablets (5 mg total) by mouth 3 (three) times daily as needed for muscle spasms. Patient not taking: Reported on 01/24/2023 09/06/22   Marcine Matar, MD  DULoxetine (CYMBALTA) 20 MG capsule Take 1 capsule (20 mg total) by mouth daily. 01/24/23   Anders Simmonds, PA-C  gabapentin (NEURONTIN) 300 MG capsule Take 1 capsule (300 mg total) by  mouth at bedtime. 01/24/23   Anders Simmonds, PA-C  meloxicam (MOBIC) 7.5 MG tablet Take 1 tablet (7.5 mg total) by mouth daily as needed for pain 01/24/23   Georgian Co M, PA-C  valsartan (DIOVAN) 40 MG tablet Take 1 tablet (40 mg total) by mouth daily. Patient not taking: Reported on 01/24/2023 08/28/22   Marcine Matar, MD    ROS: Neg HEENT Neg resp Neg cardiac Neg GI Neg GU Neg MS Neg psych Neg neuro  Objective:   Vitals:   01/24/23 1426 01/24/23 1447  BP: (!) 155/100 (!) 168/123  Pulse: 76   SpO2: 94%   Weight: 247 lb 3.2 oz (112.1 kg)    Exam General appearance : Awake, alert, not in any distress. Speech Clear. Not toxic looking HEENT: Atraumatic and Normocephalic, pupils equally reactive to light and accomodation Neck: Supple, no JVD. No cervical lymphadenopathy.  Chest: Good air entry bilaterally, CTAB.  No rales/rhonchi/wheezing CVS: S1 S2 regular, no murmurs.  Abdomen: Bowel sounds present, Non tender and not distended with no gaurding, rigidity or rebound. Extremities: B/L Lower Ext shows no edema, both legs are warm to touch Neurology: Awake alert, and oriented X 3, CN II-XII intact, Non focal Skin: No Rash  Data Review Lab Results  Component Value Date   HGBA1C 5.3 07/07/2020    Assessment &  Plan   1. Neuropathy - gabapentin (NEURONTIN) 300 MG capsule; Take 1 capsule (300 mg total) by mouth at bedtime.  Dispense: 90 capsule; Refill: 2  2. Essential hypertension Uncontrolled-not taking med - amLODipine (NORVASC) 10 MG tablet; Take 1 tablet (10 mg total) by mouth daily.  Dispense: 30 tablet; Refill: 4 And resume valsartan-Rx sent  3. Pain of right hand - Ambulatory referral to Pain Clinic - meloxicam (MOBIC) 7.5 MG tablet; Take 1 tablet (7.5 mg total) by mouth daily as needed for pain  Dispense: 30 tablet; Refill: 3 - gabapentin (NEURONTIN) 300 MG capsule; Take 1 capsule (300 mg total) by mouth at bedtime.  Dispense: 90 capsule; Refill: 2 -  DULoxetine (CYMBALTA) 20 MG capsule; Take 1 capsule (20 mg total) by mouth daily.  Dispense: 30 capsule; Refill: 3  4. Chronic pain of left knee - Ambulatory referral to Pain Clinic - meloxicam (MOBIC) 7.5 MG tablet; Take 1 tablet (7.5 mg total) by mouth daily as needed for pain  Dispense: 30 tablet; Refill: 3 - gabapentin (NEURONTIN) 300 MG capsule; Take 1 capsule (300 mg total) by mouth at bedtime.  Dispense: 90 capsule; Refill: 2 - DULoxetine (CYMBALTA) 20 MG capsule; Take 1 capsule (20 mg total) by mouth daily.  Dispense: 30 capsule; Refill: 3  5. Chronic right shoulder pain - Ambulatory referral to Pain Clinic - meloxicam (MOBIC) 7.5 MG tablet; Take 1 tablet (7.5 mg total) by mouth daily as needed for pain  Dispense: 30 tablet; Refill: 3 - gabapentin (NEURONTIN) 300 MG capsule; Take 1 capsule (300 mg total) by mouth at bedtime.  Dispense: 90 capsule; Refill: 2 - DULoxetine (CYMBALTA) 20 MG capsule; Take 1 capsule (20 mg total) by mouth daily.  Dispense: 30 capsule; Refill: 3    Return in about 3 months (around 04/26/2023) for PCP for chronic conditions([facility]).  The patient was given clear instructions to go to ER or return to medical center if symptoms don't improve, worsen or new problems develop. The patient verbalized understanding. The patient was told to call to get lab results if they haven't heard anything in the next week.      Georgian Co, PA-C [facility] [place], [place] [phone]   01/24/2023, 2:49 PM

## 2023-01-24 NOTE — Patient Instructions (Signed)
Make follow up appt with ortho

## 2023-01-24 NOTE — Telephone Encounter (Signed)
 Noted  

## 2023-02-05 ENCOUNTER — Other Ambulatory Visit (HOSPITAL_COMMUNITY): Payer: Self-pay

## 2023-02-06 ENCOUNTER — Other Ambulatory Visit: Payer: Self-pay

## 2023-02-06 ENCOUNTER — Other Ambulatory Visit (HOSPITAL_COMMUNITY): Payer: Self-pay

## 2023-02-07 ENCOUNTER — Other Ambulatory Visit: Payer: Self-pay

## 2023-02-08 ENCOUNTER — Encounter (HOSPITAL_COMMUNITY): Payer: Self-pay

## 2023-02-08 ENCOUNTER — Other Ambulatory Visit (HOSPITAL_COMMUNITY): Payer: Self-pay

## 2023-02-08 ENCOUNTER — Other Ambulatory Visit: Payer: Self-pay

## 2023-02-09 ENCOUNTER — Ambulatory Visit (INDEPENDENT_AMBULATORY_CARE_PROVIDER_SITE_OTHER): Payer: Commercial Managed Care - HMO | Admitting: Surgical

## 2023-02-09 ENCOUNTER — Encounter: Payer: Self-pay | Admitting: Surgical

## 2023-02-09 DIAGNOSIS — M25462 Effusion, left knee: Secondary | ICD-10-CM | POA: Diagnosis not present

## 2023-02-09 DIAGNOSIS — M25562 Pain in left knee: Secondary | ICD-10-CM | POA: Diagnosis not present

## 2023-02-09 NOTE — Progress Notes (Signed)
Office Visit Note   Patient: Lisa Haley           Date of Birth: 12/22/1975           MRN: [id number] Visit Date: 02/09/2023 Requested by: Marcine Matar, MD [address] [place],  [place] [address] PCP: Marcine Matar, MD  Subjective: Chief Complaint  Patient presents with   Left Knee - Pain    HPI: Lisa Haley is a 47 y.o. female who presents to the office reporting left knee pain.  Patient states that she has had pain for several months.  She has had 2 prior injections with the last injection by Clerance Lav persons PA-C about 4 months ago with 3 weeks of 100% relief of her symptoms.  That was fairly short-lived.  She describes primarily anterior and medial knee pain with some posterior pain at times as well.  She also has groin pain with anterior thigh pain that radiates down to the knee.  She has occasional popping sensation.  She has stiffness with immobility especially in the morning.  Describes constant aching at times.  No low back pain, numbness/tingling, locking symptoms.  No history of prior surgery to the left knee, hip, back.  No fevers or chills.  Pain is becoming worse and worse.  She does not work currently.  She likes to play cards with her boyfriend..                ROS: All systems reviewed are negative as they relate to the chief complaint within the history of present illness.  Patient denies fevers or chills.  Assessment & Plan: Visit Diagnoses:  1. Medial joint line tenderness of left knee   2. Effusion, left knee     Plan: Patient is a 47 year old female who presents complaining of left knee pain.  Previous injection by Clerance Lav persons PA-C about 4 months ago gave her great relief but that was short-lived for only 3 weeks.  She now complains of worsening symptoms with popping sensation.  She has radiographs that were reviewed from earlier this year demonstrating no significant degenerative changes with fairly well-preserved joint  space in the medial and lateral compartments.  With lack of radiographic abnormality to explain patient's pain and her continued effusion, need MRI of the left knee to evaluate for source of pain which based on her history is likely a medial meniscal tear.  Follow-up after MRI to review results.    Follow-Up Instructions: No follow-ups on file.   Orders:  No orders of the defined types were placed in this encounter.  No orders of the defined types were placed in this encounter.     Procedures: No procedures performed   Clinical Data: No additional findings.  Objective: Vital Signs: There were no vitals taken for this visit.  Physical Exam:  Constitutional: Patient appears well-developed HEENT:  Head: Normocephalic Eyes:EOM are normal Neck: Normal range of motion Cardiovascular: Normal rate Pulmonary/chest: Effort normal Neurologic: Patient is alert Skin: Skin is warm Psychiatric: Patient has normal mood and affect  Ortho Exam: Ortho exam demonstrates left knee with positive effusion.  She has tenderness exquisitely over the medial joint line.  No tenderness over the lateral joint line.  Able to perform straight leg raise without extensor lag.  Negative Stinchfield sign.  Negative FADIR sign.  Note hip pain is reproducible on exam localizing to the groin.  She has no calf tenderness.  Negative Homans' sign.  Intact ankle dorsiflexion and  plantarflexion without weakness.  Stable to anterior and posterior drawer signs.  Negative patellar grind test.  She has no patellofemoral crepitus.  Specialty Comments:  No specialty comments available.  Imaging: No results found.   PMFS History: Patient Active Problem List   Diagnosis Date Noted   Pain in right shoulder 09/22/2022   Pain in left knee 09/22/2022   Pelvic pain in female 07/26/2022   Adnexal mass 01/24/2022   BMI 45.0-49.9, adult (HCC) 01/24/2022   History of stroke 01/24/2022   Neuropathy 04/21/2021   Left hip pain  04/21/2021   SOB (shortness of breath) 04/21/2021   Migraine without aura and without status migrainosus, not intractable 08/06/2020   Essential hypertension 08/06/2020   Tobacco dependence 08/06/2020   Class 3 severe obesity due to excess calories with serious comorbidity and body mass index (BMI) of 45.0 to 49.9 in adult [facility]) 08/06/2020   SAH (subarachnoid hemorrhage) (HCC) 07/06/2020   Past Medical History:  Diagnosis Date   Anemia    after hysterectomy   Arthritis    Chronic hypertension    GERD (gastroesophageal reflux disease)    Hypertension    Obesity    Preeclampsia    Stroke (HCC) 2021   SAH, admitted at [facility] 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    Family History  Problem Relation Age of Onset   Ovarian cancer Neg Hx    Uterine cancer Neg Hx    Breast cancer Neg Hx     Past Surgical History:  Procedure Laterality Date   ABDOMINAL HYSTERECTOMY     in 30s for heavy bleeding, RSO   IR ANGIO INTRA EXTRACRAN SEL INTERNAL CAROTID BILAT MOD SED  07/06/2020   IR ANGIO INTRA EXTRACRAN SEL INTERNAL CAROTID BILAT MOD SED  07/12/2020   IR ANGIO VERTEBRAL SEL VERTEBRAL BILAT MOD SED  07/06/2020   IR ANGIO VERTEBRAL SEL VERTEBRAL BILAT MOD SED  07/12/2020   RADIOLOGY WITH ANESTHESIA N/A 07/06/2020   Procedure: IR WITH ANESTHESIA;  Surgeon: Lisbeth Renshaw, MD;  Location[facility] OR;  Service: Radiology;  Laterality: N/A;   ROBOTIC ASSISTED BILATERAL SALPINGO OOPHERECTOMY N/A 02/14/2022   Procedure: XI ROBOTIC ASSISTED UNILATERAL SALPINGO OOPHORECTOMY, LYSIS OF ADHESIONS, LEFT URETEROLYSIS;  Surgeon: Carver Fila, MD;  Location: [facility] ORS;  Service: Gynecology;  Laterality: N/A;   TUBAL LIGATION     WRIST SURGERY Left    for carpal tunnel   Social History   Occupational History   Not on file  Tobacco Use   Smoking status: Every Day    Current packs/day: 0.25    Average packs/day: 0.3 packs/day for 20.0 years (5.0 ttl pk-yrs)    Types: Cigarettes   Smokeless tobacco: Never  Vaping  Use   Vaping status: Never Used  Substance and Sexual Activity   Alcohol use: Yes    Comment: once a week   Drug use: Yes    Types: Marijuana    Comment: 3-4x week   Sexual activity: Yes    Birth control/protection: Surgical    Comment: btl

## 2023-02-09 NOTE — Addendum Note (Signed)
Addended byPrescott Parma on: 02/09/2023 03:49 PM   Modules accepted: Orders

## 2023-02-12 ENCOUNTER — Other Ambulatory Visit (HOSPITAL_BASED_OUTPATIENT_CLINIC_OR_DEPARTMENT_OTHER): Payer: Self-pay

## 2023-02-20 ENCOUNTER — Encounter: Payer: Self-pay | Admitting: Physical Medicine & Rehabilitation

## 2023-02-21 ENCOUNTER — Encounter: Payer: Commercial Managed Care - HMO | Admitting: Physical Medicine & Rehabilitation

## 2023-02-26 ENCOUNTER — Telehealth: Payer: Self-pay | Admitting: Surgical

## 2023-02-26 NOTE — Telephone Encounter (Signed)
Called pt to R/S appt for 08/30 and pt advised me that she cannot afford the copay she does not have any money at all coming in and her copay is $110 and she will need to cancel until she can afford to come in, but until she can come in she is requesting pain meds until she can afford to come in

## 2023-02-27 ENCOUNTER — Other Ambulatory Visit: Payer: Self-pay

## 2023-02-27 ENCOUNTER — Other Ambulatory Visit: Payer: Self-pay | Admitting: Surgical

## 2023-02-27 ENCOUNTER — Encounter: Payer: Self-pay | Admitting: Pharmacist

## 2023-02-27 MED ORDER — TRAMADOL HCL 50 MG PO TABS
50.0000 mg | ORAL_TABLET | Freq: Two times a day (BID) | ORAL | 0 refills | Status: DC | PRN
  Filled 2023-02-27: qty 30, 15d supply, fill #0

## 2023-02-27 NOTE — Telephone Encounter (Signed)
Sent in temp prescription for tramadol to help with pain until MRI. I can call her with MRI results to discuss and so she can avoid copay

## 2023-02-27 NOTE — Telephone Encounter (Signed)
Called and advised pt. She will let us know once her MRI is done so you can call her. She said THANK YOU SOOO [facility]

## 2023-03-02 ENCOUNTER — Other Ambulatory Visit: Payer: Self-pay

## 2023-03-06 ENCOUNTER — Other Ambulatory Visit: Payer: Self-pay

## 2023-03-07 ENCOUNTER — Other Ambulatory Visit: Payer: Commercial Managed Care - HMO

## 2023-03-09 ENCOUNTER — Ambulatory Visit: Payer: Commercial Managed Care - HMO | Admitting: Surgical

## 2023-03-17 ENCOUNTER — Other Ambulatory Visit: Payer: Self-pay | Admitting: Internal Medicine

## 2023-03-17 DIAGNOSIS — R0602 Shortness of breath: Secondary | ICD-10-CM

## 2023-03-19 ENCOUNTER — Other Ambulatory Visit (HOSPITAL_COMMUNITY): Payer: Self-pay

## 2023-03-19 MED ORDER — CETIRIZINE HCL 10 MG PO TABS
10.0000 mg | ORAL_TABLET | Freq: Every day | ORAL | 0 refills | Status: DC
  Filled 2023-03-19 – 2023-07-16 (×9): qty 30, 30d supply, fill #0

## 2023-03-20 ENCOUNTER — Other Ambulatory Visit: Payer: Self-pay

## 2023-03-23 ENCOUNTER — Telehealth: Payer: Self-pay | Admitting: Surgical

## 2023-03-23 NOTE — Telephone Encounter (Signed)
Called patient spoke with Vincieo (friend) advised MRI is still pending approval and when approval is obtained patient can schedule an appointment and the results  will be given to her over the phone. I asked Vincieo to advise patient she do not need to come in for her 03/28/2023 appointment. I advised will cancel appointment once I hear from patient.

## 2023-03-28 ENCOUNTER — Ambulatory Visit: Payer: Commercial Managed Care - HMO | Admitting: Surgical

## 2023-03-30 ENCOUNTER — Encounter
Payer: Commercial Managed Care - HMO | Attending: Physical Medicine & Rehabilitation | Admitting: Physical Medicine & Rehabilitation

## 2023-04-02 ENCOUNTER — Ambulatory Visit: Payer: Self-pay | Admitting: *Deleted

## 2023-04-02 ENCOUNTER — Other Ambulatory Visit (HOSPITAL_COMMUNITY): Payer: Self-pay

## 2023-04-02 ENCOUNTER — Telehealth: Payer: Self-pay | Admitting: Orthopedic Surgery

## 2023-04-02 ENCOUNTER — Other Ambulatory Visit: Payer: Self-pay

## 2023-04-02 ENCOUNTER — Other Ambulatory Visit: Payer: Self-pay | Admitting: Surgical

## 2023-04-02 DIAGNOSIS — M79641 Pain in right hand: Secondary | ICD-10-CM

## 2023-04-02 DIAGNOSIS — G8929 Other chronic pain: Secondary | ICD-10-CM

## 2023-04-02 MED ORDER — ACETAMINOPHEN-CODEINE 300-30 MG PO TABS
1.0000 | ORAL_TABLET | Freq: Every evening | ORAL | 0 refills | Status: DC | PRN
Start: 2023-04-02 — End: 2023-06-06
  Filled 2023-04-02 – 2023-05-11 (×5): qty 21, 21d supply, fill #0

## 2023-04-02 MED ORDER — MELOXICAM 15 MG PO TABS
15.0000 mg | ORAL_TABLET | Freq: Every day | ORAL | 1 refills | Status: DC
Start: 2023-04-02 — End: 2023-11-07
  Filled 2023-04-02 – 2023-07-16 (×9): qty 30, 30d supply, fill #0
  Filled 2023-08-11: qty 30, 30d supply, fill #1

## 2023-04-02 NOTE — Telephone Encounter (Signed)
I called patient.  MRI has not been approved due to failure of conservative treatment.  However, she has had previous cortisone injection by Clerance Lav that gave her great relief for about 4 months.  I saw her on 02/09/2023 and I just gave her a call today on 04/02/2023 which is about 7 weeks later and she has no improvement in her knee pain despite a home exercise program that she has been keeping up with.  She has swelling based on activity and difficulty bending the knee at times.  Pain now keeps her up at night and nothing really helps her with sleep.  I did prescribe Tylenol with codeine to help her with some of the nighttime pain.  She understands not to take this while driving or working.  Can we resubmit with this new information to get her MRI scan approved?

## 2023-04-02 NOTE — Telephone Encounter (Signed)
  Chief Complaint: Left knee pain.   Can't afford the $110 needed to be seen at the pain management center where she was referred for the knee pain. Ortho is ordering her an MRI.   She missed the first MRI appt due to transportation. Symptoms: Left knee pain.   Hard to bend it. Frequency: Hurts all the time. Pertinent Negatives: Patient denies N/A Disposition: [] ED /[] Urgent Care (no appt availability in office) / [] Appointment(In office/virtual)/ []  [facility]/ [] Home Care/ [] Refused Recommended Disposition /[] [place] [facility]/ [x]  Follow-up with PCP Additional Notes: Pt is requesting pain medication for her knee since she can't afford to go to the pain management clinic.   She already has an appt with Dr. Laural Benes on 04/26/2023 at 2:10.   She will keep that appt since there is nothing sooner with any of the other providers.  She is going to call the ortho dr back and ask them to arrange for the MRI to be done at a location where she can take the bus.

## 2023-04-02 NOTE — Telephone Encounter (Signed)
Reason for Disposition  [1] MODERATE pain (e.g., interferes with normal activities, limping) AND [2] present > 3 days  Answer Assessment - Initial Assessment Questions 1. LOCATION and RADIATION: "Where is the pain located?"      My left knee is hurting.   I have a referral for pain management.   I don't have the money to go there.   It's $110 to be seen.   I need something done for the pain.   I can't hardly bend my knee.   I've been referred to ortho.    He was talking about doing surgery and has ordered an MRI.   I missed the appt for the MRI because I wasn't able to get a bus to that appt.     2. QUALITY: "What does the pain feel like?"  (e.g., sharp, dull, aching, burning)     I'm having severe pain in my left knee.   I've going to the plasma center and they told me my protein level was low so I need to see Dr. Laural Benes anyway and can she give me something for pain for my knee since I can't afford the pain management center? 3. SEVERITY: "How bad is the pain?" "What does it keep you from doing?"   (Scale 1-10; or mild, moderate, severe)   -  MILD (1-3): doesn't interfere with normal activities    -  MODERATE (4-7): interferes with normal activities (e.g., work or school) or awakens from sleep, limping    -  SEVERE (8-10): excruciating pain, unable to do any normal activities, unable to walk     Severe pain and hard to bend my knee 4. ONSET: "When did the pain start?" "Does it come and go, or is it there all the time?"     Not asked since has a referral for ortho and is to have an MRI 5. RECURRENT: "Have you had this pain before?" If Yes, ask: "When, and what happened then?"     Not asked 7. AGGRAVATING FACTORS: "What makes the knee pain worse?" (e.g., walking, climbing stairs, running)     I can't hardly bend it. 8. ASSOCIATED SYMPTOMS: "Is there any swelling or redness of the knee?"     Not asked 9. OTHER SYMPTOMS: "Do you have any other symptoms?" (e.g., chest pain, difficulty breathing,  fever, calf pain)     No 10. PREGNANCY: "Is there any chance you are pregnant?" "When was your last menstrual period?"       N/A  Protocols used: Knee Pain-A-AH

## 2023-04-02 NOTE — Telephone Encounter (Signed)
Per PCP Should f/u with her ortho on this and get MRI reschedule.  Can only offer refill on Meloxicam at this time with increase dose of 15 mg daily.  Call placed to patient unable to reach message left on VM.

## 2023-04-02 NOTE — Addendum Note (Signed)
Addended by: Jonah Blue B on: 04/02/2023 02:34 PM   Modules accepted: Orders

## 2023-04-02 NOTE — Telephone Encounter (Signed)
Patient called wanted to know if you could give her something stronger. She currently taking Tramadol and its not working. CB#[phone]

## 2023-04-03 ENCOUNTER — Encounter: Payer: Self-pay | Admitting: Pharmacist

## 2023-04-03 ENCOUNTER — Other Ambulatory Visit: Payer: Self-pay

## 2023-04-03 NOTE — Telephone Encounter (Signed)
Called [employer] spoke with Harriett Sine and gave all pertinent information to her and now its back to the medical director review. Pending decision.

## 2023-04-04 ENCOUNTER — Other Ambulatory Visit: Payer: Self-pay

## 2023-04-04 NOTE — Telephone Encounter (Signed)
Left message with a gentleman that answered the phone asking for patient to return call.

## 2023-04-05 ENCOUNTER — Other Ambulatory Visit: Payer: Self-pay

## 2023-04-06 ENCOUNTER — Other Ambulatory Visit: Payer: Self-pay

## 2023-04-10 NOTE — Telephone Encounter (Signed)
Spoke with patient . Patient reported that she has called on serveral occasion to schedule  her MRI. Call placed to [facility] and they are waiting for a PA. Patient is aware

## 2023-04-12 ENCOUNTER — Other Ambulatory Visit: Payer: Self-pay

## 2023-04-13 NOTE — Telephone Encounter (Signed)
This got approved with [id number] valid 02/28/22-08/28/23- sent message to [facility] to schedule

## 2023-04-16 ENCOUNTER — Other Ambulatory Visit: Payer: Self-pay

## 2023-04-16 ENCOUNTER — Other Ambulatory Visit (HOSPITAL_COMMUNITY): Payer: Self-pay

## 2023-04-17 ENCOUNTER — Telehealth: Payer: Self-pay

## 2023-04-17 ENCOUNTER — Encounter: Payer: Self-pay | Admitting: Pharmacist

## 2023-04-17 ENCOUNTER — Other Ambulatory Visit: Payer: Self-pay

## 2023-04-17 NOTE — Telephone Encounter (Signed)
Copied from CRM [id number]. Topic: General - Other >> Apr 17, 2023  2:56 PM Everette C wrote: Reason for CRM: The patient would like to speak with C.Manson Passey when possible to continue ongoing discussions related to a possible MRI for their left knee concern  Please contact further when possible

## 2023-04-17 NOTE — Telephone Encounter (Signed)
Spoke with patient . Patient voiced that she received a letter for you insurance company stating that the MRI  that was order has been approved. Patient voiced that she has not received a call from anyone letting her know when and where to go for the MRI. Advise the the imaging center had closed for today. We will call them to follow-up as soon as possible.

## 2023-04-18 NOTE — Telephone Encounter (Addendum)
Spoke with [place] at [facility].  Advised that the patient called on yesterday to make [place] aware the she received a letter from her insurance stating that her MRI has been approved . [place] voiced that she would  reach out to the PA dept  and let them know.Voiced that she would have someone to call reach out to the patient

## 2023-04-19 ENCOUNTER — Other Ambulatory Visit: Payer: Self-pay

## 2023-04-19 ENCOUNTER — Other Ambulatory Visit: Payer: Commercial Managed Care - HMO

## 2023-04-19 ENCOUNTER — Other Ambulatory Visit (HOSPITAL_BASED_OUTPATIENT_CLINIC_OR_DEPARTMENT_OTHER): Payer: Self-pay

## 2023-04-19 ENCOUNTER — Other Ambulatory Visit (HOSPITAL_COMMUNITY): Payer: Self-pay

## 2023-04-26 ENCOUNTER — Ambulatory Visit: Payer: Commercial Managed Care - HMO | Admitting: Internal Medicine

## 2023-04-27 ENCOUNTER — Other Ambulatory Visit (HOSPITAL_COMMUNITY): Payer: Self-pay

## 2023-04-30 ENCOUNTER — Ambulatory Visit: Payer: Commercial Managed Care - HMO | Admitting: Surgical

## 2023-05-10 ENCOUNTER — Other Ambulatory Visit (HOSPITAL_COMMUNITY): Payer: Self-pay

## 2023-05-11 ENCOUNTER — Encounter (HOSPITAL_COMMUNITY): Payer: Self-pay

## 2023-05-11 ENCOUNTER — Other Ambulatory Visit: Payer: Self-pay

## 2023-05-11 ENCOUNTER — Other Ambulatory Visit (HOSPITAL_COMMUNITY): Payer: Self-pay

## 2023-05-15 ENCOUNTER — Other Ambulatory Visit (HOSPITAL_COMMUNITY): Payer: Self-pay

## 2023-05-16 ENCOUNTER — Other Ambulatory Visit: Payer: Self-pay

## 2023-05-21 ENCOUNTER — Other Ambulatory Visit (HOSPITAL_COMMUNITY): Payer: Self-pay

## 2023-05-21 ENCOUNTER — Other Ambulatory Visit: Payer: Commercial Managed Care - HMO

## 2023-05-28 ENCOUNTER — Ambulatory Visit: Payer: Commercial Managed Care - HMO | Admitting: Surgical

## 2023-05-29 ENCOUNTER — Ambulatory Visit (HOSPITAL_COMMUNITY)
Admission: EM | Admit: 2023-05-29 | Discharge: 2023-05-29 | Disposition: A | Discharge status: Home / Self Care | Payer: Managed Care, Other (non HMO) | Attending: Family Medicine | Admitting: Family Medicine

## 2023-05-29 ENCOUNTER — Encounter (HOSPITAL_COMMUNITY): Payer: Self-pay | Admitting: *Deleted

## 2023-05-29 DIAGNOSIS — L0291 Cutaneous abscess, unspecified: Secondary | ICD-10-CM | POA: Diagnosis not present

## 2023-05-29 DIAGNOSIS — L03312 Cellulitis of back [any part except buttock]: Secondary | ICD-10-CM | POA: Diagnosis not present

## 2023-05-29 MED ORDER — DOXYCYCLINE HYCLATE 100 MG PO CAPS: 100.0000 mg | ORAL_CAPSULE | Freq: Two times a day (BID) | ORAL | 0 refills | Status: AC

## 2023-05-29 NOTE — ED Provider Notes (Signed)
[facility]    CSN: [id number] Arrival date & time: 05/29/23  1521      History   Chief Complaint Chief Complaint  Patient presents with   Abscess    HPI Lisa Haley is a 47 y.o. female.    Abscess Here for some tenderness and swelling on her back.  She noticed it about 10 days ago and it got bigger.  Then about 6 or 7 days ago it started draining.  No fever or chills  No allergies to medications   She has had a hysterectomy  Past Medical History:  Diagnosis Date   Anemia    after hysterectomy   Arthritis    Chronic hypertension    GERD (gastroesophageal reflux disease)    Hypertension    Obesity    Preeclampsia    Stroke (HCC) 2021   SAH, admitted at [facility] 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    Patient Active Problem List   Diagnosis Date Noted   Pain in right shoulder 09/22/2022   Pain in left knee 09/22/2022   Pelvic pain in female 07/26/2022   Adnexal mass 01/24/2022   BMI 45.0-49.9, adult (HCC) 01/24/2022   History of stroke 01/24/2022   Neuropathy 04/21/2021   Left hip pain 04/21/2021   SOB (shortness of breath) 04/21/2021   Migraine without aura and without status migrainosus, not intractable 08/06/2020   Essential hypertension 08/06/2020   Tobacco dependence 08/06/2020   Class 3 severe obesity due to excess calories with serious comorbidity and body mass index (BMI) of 45.0 to 49.9 in adult [facility]) 08/06/2020   SAH (subarachnoid hemorrhage) (HCC) 07/06/2020    Past Surgical History:  Procedure Laterality Date   ABDOMINAL HYSTERECTOMY     in 30s for heavy bleeding, RSO   IR ANGIO INTRA EXTRACRAN SEL INTERNAL CAROTID BILAT MOD SED  07/06/2020   IR ANGIO INTRA EXTRACRAN SEL INTERNAL CAROTID BILAT MOD SED  07/12/2020   IR ANGIO VERTEBRAL SEL VERTEBRAL BILAT MOD SED  07/06/2020   IR ANGIO VERTEBRAL SEL VERTEBRAL BILAT MOD SED  07/12/2020   RADIOLOGY WITH ANESTHESIA N/A 07/06/2020   Procedure: IR WITH ANESTHESIA;  Surgeon: Lisbeth Renshaw, MD;  Location: [facility] OR;  Service: Radiology;  Laterality: N/A;   ROBOTIC ASSISTED BILATERAL SALPINGO OOPHERECTOMY N/A 02/14/2022   Procedure: XI ROBOTIC ASSISTED UNILATERAL SALPINGO OOPHORECTOMY, LYSIS OF ADHESIONS, LEFT URETEROLYSIS;  Surgeon: Carver Fila, MD;  Location: [facility];  Service: Gynecology;  Laterality: N/A;   TUBAL LIGATION     WRIST SURGERY Left    for carpal tunnel    OB History     Gravida  4   Para  3   Term      Preterm  3   AB  1   Living  3      SAB  1   IAB      Ectopic      Multiple      Live Births  3            Home Medications    Prior to Admission medications   Medication Sig Start Date End Date Taking? Authorizing Provider  doxycycline (VIBRAMYCIN) 100 MG capsule Take 1 capsule (100 mg total) by mouth 2 (two) times daily for 7 days. 05/29/23 06/05/23 Yes Zenia Resides, MD  gabapentin (NEURONTIN) 300 MG capsule Take 1 capsule (300 mg total) by mouth at bedtime. 01/24/23  Yes Georgian Co M, PA-C  acetaminophen-codeine (TYLENOL #3) 300-30 MG tablet Take 1  tablet by mouth at bedtime as needed for moderate pain. 04/02/23   Magnant, Charles L, PA-C  albuterol (VENTOLIN HFA) 108 (90 Base) MCG/ACT inhaler Inhale 2 puffs into the lungs every 6 (six) hours as needed for wheezing or shortness of breath. Patient not taking: Reported on 01/24/2023 09/04/22   Marcine Matar, MD  amLODipine (NORVASC) 10 MG tablet Take 1 tablet (10 mg total) by mouth daily. 01/24/23   Anders Simmonds, PA-C  cetirizine (ZYRTEC) 10 MG tablet Take 1 tablet (10 mg total) by mouth daily. 03/19/23   Marcine Matar, MD  cyclobenzaprine (FLEXERIL) 10 MG tablet Take 0.5 tablets (5 mg total) by mouth 3 (three) times daily as needed for muscle spasms. Patient not taking: Reported on 01/24/2023 09/06/22   Marcine Matar, MD  DULoxetine (CYMBALTA) 20 MG capsule Take 1 capsule (20 mg total) by mouth daily. 01/24/23   Anders Simmonds, PA-C  meloxicam  (MOBIC) 15 MG tablet Take 1 tablet (15 mg total) by mouth daily. 04/02/23   Marcine Matar, MD  meloxicam (MOBIC) 7.5 MG tablet Take 1 tablet (7.5 mg total) by mouth daily as needed for pain 01/24/23   Georgian Co M, PA-C  valsartan (DIOVAN) 40 MG tablet Take 1 tablet (40 mg total) by mouth daily. 01/24/23   Anders Simmonds, PA-C    Family History Family History  Problem Relation Age of Onset   Ovarian cancer Neg Hx    Uterine cancer Neg Hx    Breast cancer Neg Hx     Social History Social History   Tobacco Use   Smoking status: Every Day    Current packs/day: 0.25    Average packs/day: 0.3 packs/day for 20.0 years (5.0 ttl pk-yrs)    Types: Cigarettes   Smokeless tobacco: Never  Vaping Use   Vaping status: Never Used  Substance Use Topics   Alcohol use: Yes    Comment: once a week   Drug use: Yes    Types: Marijuana    Comment: 3-4x week     Allergies   Patient has no known allergies.   Review of Systems Review of Systems   Physical Exam Triage Vital Signs ED Triage Vitals  Encounter Vitals Group     BP 05/29/23 1633 (!) 168/122     Systolic BP Percentile --      Diastolic BP Percentile --      Pulse Rate 05/29/23 1633 80     Resp 05/29/23 1633 18     Temp 05/29/23 1633 97.6 F (36.4 C)     Temp Source 05/29/23 1633 Oral     SpO2 05/29/23 1633 98 %     Weight --      Height --      Head Circumference --      Peak Flow --      Pain Score 05/29/23 1631 10     Pain Loc --      Pain Education --      Exclude from Growth Chart --    No data found.  Updated Vital Signs BP (!) 168/122 (BP Location: Right Arm)   Pulse 80   Temp 97.6 F (36.4 C) (Oral)   Resp 18   SpO2 98%   Visual Acuity Right Eye Distance:   Left Eye Distance:   Bilateral Distance:    Right Eye Near:   Left Eye Near:    Bilateral Near:     Physical Exam Vitals reviewed.  Constitutional:      General: She is not in acute distress.    Appearance: She is not  ill-appearing, toxic-appearing or diaphoretic.  Skin:    Coloration: Skin is not jaundiced or pale.     Comments: On examination of the skin of the thoracic area there is an area of induration about 8 cm x 5 cm.  The lower pole does have an area that is been draining some yellow pus.  The upper pole is firm and not fluctuant.  There is tenderness in the area.  Neurological:     Mental Status: She is alert and oriented to person, place, and time.  Psychiatric:        Behavior: Behavior normal.      UC Treatments / Results  Labs (all labs ordered are listed, but only abnormal results are displayed) Labs Reviewed - No data to display  EKG   Radiology No results found.  Procedures Procedures (including critical care time)  Medications Ordered in UC Medications - No data to display  Initial Impression / Assessment and Plan / UC Course  I have reviewed the triage vital signs and the nursing notes.  Pertinent labs & imaging results that were available during my care of the patient were reviewed by me and considered in my medical decision making (see chart for details).     Doxycycline is sent in to treat the cellulitis and the draining abscess.  She will follow-up with her primary care Final Clinical Impressions(s) / UC Diagnoses   Final diagnoses:  Abscess  Cellulitis of back except buttock     Discharge Instructions      Take doxycycline 100 mg --1 capsule 2 times daily for 7 days  Do warm compresses or warm soaks of the area a couple of times a day.  Follow-up with your primary care     ED Prescriptions     Medication Sig Dispense Auth. Provider   doxycycline (VIBRAMYCIN) 100 MG capsule Take 1 capsule (100 mg total) by mouth 2 (two) times daily for 7 days. 14 capsule Marlinda Mike, Janace Aris, MD      PDMP not reviewed this encounter.   Zenia Resides, MD 05/29/23 [phone]

## 2023-05-29 NOTE — ED Triage Notes (Signed)
Pt states she has a couple abscess on her back that have been draining over the last couple of weeks. She has been cleaning with warm water and soap.

## 2023-05-29 NOTE — Discharge Instructions (Signed)
Take doxycycline 100 mg --1 capsule 2 times daily for 7 days  Do warm compresses or warm soaks of the area a couple of times a day.  Follow-up with your primary care

## 2023-06-06 ENCOUNTER — Telehealth: Payer: Self-pay | Admitting: Surgical

## 2023-06-06 ENCOUNTER — Other Ambulatory Visit: Payer: Self-pay | Admitting: Surgical

## 2023-06-06 ENCOUNTER — Other Ambulatory Visit: Payer: Self-pay

## 2023-06-06 ENCOUNTER — Other Ambulatory Visit (HOSPITAL_COMMUNITY): Payer: Self-pay

## 2023-06-06 MED ORDER — ACETAMINOPHEN-CODEINE 300-30 MG PO TABS
1.0000 | ORAL_TABLET | Freq: Every evening | ORAL | 0 refills | Status: DC | PRN
  Filled 2023-06-06 – 2023-07-16 (×2): qty 14, 14d supply, fill #0
  Filled 2023-08-14 – 2023-08-20 (×2): qty 5, 5d supply, fill #0
  Filled 2023-10-07: qty 14, 14d supply, fill #0

## 2023-06-06 NOTE — Telephone Encounter (Signed)
Tried calling pt. No answer and no voicemail.

## 2023-06-06 NOTE — Telephone Encounter (Signed)
Pt called requesting pain medication. Please call pt about this matter at [phone].

## 2023-06-06 NOTE — Telephone Encounter (Signed)
Sent in refill for Tylenol #3 to give some relief for sleeping until she gets MRI scan. Dont want to do this long-term

## 2023-06-08 ENCOUNTER — Encounter: Payer: Self-pay | Admitting: Pharmacist

## 2023-06-08 ENCOUNTER — Other Ambulatory Visit: Payer: Self-pay

## 2023-06-11 ENCOUNTER — Other Ambulatory Visit: Payer: Managed Care, Other (non HMO)

## 2023-06-12 ENCOUNTER — Other Ambulatory Visit: Payer: Self-pay

## 2023-06-13 ENCOUNTER — Other Ambulatory Visit (HOSPITAL_COMMUNITY): Payer: Self-pay

## 2023-06-13 ENCOUNTER — Other Ambulatory Visit: Payer: Self-pay

## 2023-06-15 ENCOUNTER — Other Ambulatory Visit: Payer: Self-pay

## 2023-06-15 ENCOUNTER — Other Ambulatory Visit (HOSPITAL_COMMUNITY): Payer: Self-pay

## 2023-06-15 ENCOUNTER — Encounter (HOSPITAL_COMMUNITY): Payer: Self-pay

## 2023-06-18 ENCOUNTER — Other Ambulatory Visit: Payer: Self-pay

## 2023-06-25 ENCOUNTER — Telehealth: Payer: Self-pay | Admitting: Internal Medicine

## 2023-06-25 NOTE — Telephone Encounter (Signed)
Pt is calling to request if referral can be placed to [facility] [address] [place], [place] [address] Ph# [phone]  Prev Referral expired

## 2023-06-27 NOTE — Telephone Encounter (Signed)
No answer, voicemail not set up. 

## 2023-06-27 NOTE — Telephone Encounter (Signed)
Patient no showed the appointment with PMR 03/2023 based on referral that was sent by PA [facility].  Should get MRI of the knee done first as was ordered by orthopedics then we can resubmit referral.

## 2023-06-29 ENCOUNTER — Encounter: Payer: Self-pay | Admitting: *Deleted

## 2023-06-29 NOTE — Telephone Encounter (Signed)
Unable to reach. No voicemail set up.  Will send message from Dr. Laural Benes to MyChart.

## 2023-07-02 NOTE — Telephone Encounter (Signed)
Unable to reach or leave a voicemail.

## 2023-07-12 ENCOUNTER — Other Ambulatory Visit (HOSPITAL_COMMUNITY): Payer: Self-pay

## 2023-07-12 ENCOUNTER — Other Ambulatory Visit: Payer: Self-pay

## 2023-07-12 ENCOUNTER — Other Ambulatory Visit (HOSPITAL_BASED_OUTPATIENT_CLINIC_OR_DEPARTMENT_OTHER): Payer: Self-pay

## 2023-07-16 ENCOUNTER — Other Ambulatory Visit (HOSPITAL_COMMUNITY): Payer: Self-pay

## 2023-07-16 ENCOUNTER — Other Ambulatory Visit: Payer: Self-pay

## 2023-07-24 ENCOUNTER — Encounter: Payer: Managed Care, Other (non HMO) | Admitting: Internal Medicine

## 2023-08-11 ENCOUNTER — Other Ambulatory Visit: Payer: Self-pay | Admitting: Internal Medicine

## 2023-08-11 DIAGNOSIS — R0602 Shortness of breath: Secondary | ICD-10-CM

## 2023-08-13 ENCOUNTER — Other Ambulatory Visit (HOSPITAL_COMMUNITY): Payer: Self-pay

## 2023-08-13 ENCOUNTER — Other Ambulatory Visit: Payer: Self-pay

## 2023-08-14 ENCOUNTER — Other Ambulatory Visit: Payer: Self-pay

## 2023-08-14 ENCOUNTER — Other Ambulatory Visit (HOSPITAL_COMMUNITY): Payer: Self-pay

## 2023-08-16 ENCOUNTER — Encounter: Payer: Self-pay | Admitting: Surgical

## 2023-08-20 ENCOUNTER — Other Ambulatory Visit: Payer: Self-pay | Admitting: Internal Medicine

## 2023-08-20 ENCOUNTER — Ambulatory Visit: Payer: Commercial Managed Care - HMO | Admitting: Internal Medicine

## 2023-08-20 DIAGNOSIS — G629 Polyneuropathy, unspecified: Secondary | ICD-10-CM

## 2023-08-20 DIAGNOSIS — M62838 Other muscle spasm: Secondary | ICD-10-CM

## 2023-08-20 DIAGNOSIS — R1030 Lower abdominal pain, unspecified: Secondary | ICD-10-CM

## 2023-08-21 ENCOUNTER — Other Ambulatory Visit: Payer: Self-pay

## 2023-08-21 NOTE — Telephone Encounter (Signed)
Requested medication (s) are due for refill today - unsure  Requested medication (s) are on the active medication list -yes  Future visit scheduled -yes  Last refill: 09/06/22 #40  Notes to clinic: non delegated Rx  Requested Prescriptions  Pending Prescriptions Disp Refills   cyclobenzaprine (FLEXERIL) 10 MG tablet 40 tablet 0    Sig: Take 0.5 tablets (5 mg total) by mouth 3 (three) times daily as needed for muscle spasms.     Not Delegated - Analgesics:  Muscle Relaxants Failed - 08/21/2023  8:06 AM      Failed - This refill cannot be delegated      Failed - Valid encounter within last 6 months    Recent Outpatient Visits           6 months ago Neuropathy   [place] [facility] Of [place]. [facility], Marzella Schlein, [place]   7 months ago Patient left before evaluation by physician   [facility] Of [place]. [facility] Marcine Matar, MD   11 months ago Essential hypertension   [place] [facility] [place] - [facility] Dept Of [place]. [facility] Marcine Matar, MD   1 year ago Pre-operative clearance   [place] [facility] [place] - A Dept Of [place]. [facility] Marcine Matar, MD   1 year ago Lower abdominal pain   [place] [facility] [place] [facility] Of [place]. [facility], Marzella Schlein, [place]       Future Appointments             In 1 week Sharon Seller, Mar[place], PA-C [place] [facility] - [facility] Of [place]. [facility]               Requested Prescriptions  Pending Prescriptions Disp Refills   cyclobenzaprine (FLEXERIL) 10 MG tablet 40 tablet 0    Sig: Take 0.5 tablets (5 mg total) by mouth 3 (three) times daily as needed for muscle spasms.     Not Delegated - Analgesics:  Muscle Relaxants Failed - 08/21/2023  8:06 AM      Failed - This refill cannot be delegated      Failed - Valid encounter within last 6 months     Recent Outpatient Visits           6 months ago Neuropathy   [place] [facility] Of [place]. [facility], Marzella Schlein, [place]   7 months ago Patient left before evaluation by physician   [facility] - A Dept Of [place]. [facility] Marcine Matar, MD   11 months ago Essential hypertension   [place] [facility] [place] - A Dept Of [place]. [facility] Marcine Matar, MD   1 year ago Pre-operative clearance   [place] [facility] [place] - [facility] Dept Of [place]. [facility] Marcine Matar, MD   1 year ago Lower abdominal pain   [place] [facility] [place] - A Dept Of [place]. [facility], Marzella Schlein, [place]       Future Appointments             In 1 week Sharon Seller, Marzella [place], PA-C [place] [facility] - [facility] Dept Of [place]. [facility] [place]

## 2023-08-22 ENCOUNTER — Other Ambulatory Visit (HOSPITAL_COMMUNITY): Payer: Self-pay

## 2023-08-22 ENCOUNTER — Other Ambulatory Visit: Payer: Self-pay

## 2023-08-22 MED ORDER — CYCLOBENZAPRINE HCL 10 MG PO TABS
5.0000 mg | ORAL_TABLET | Freq: Two times a day (BID) | ORAL | 0 refills | Status: DC | PRN
  Filled 2023-08-22 – 2023-08-30 (×3): qty 15, 15d supply, fill #0

## 2023-08-23 ENCOUNTER — Other Ambulatory Visit (HOSPITAL_COMMUNITY): Payer: Self-pay

## 2023-08-23 ENCOUNTER — Other Ambulatory Visit: Payer: Self-pay

## 2023-08-24 ENCOUNTER — Other Ambulatory Visit: Payer: Self-pay

## 2023-08-25 ENCOUNTER — Ambulatory Visit
Admission: RE | Admit: 2023-08-25 | Discharge: 2023-08-25 | Disposition: A | Discharge status: Home / Self Care | Payer: Commercial Managed Care - HMO | Source: Ambulatory Visit | Attending: Surgical | Admitting: Surgical

## 2023-08-25 DIAGNOSIS — M25562 Pain in left knee: Secondary | ICD-10-CM

## 2023-08-25 DIAGNOSIS — M25462 Effusion, left knee: Secondary | ICD-10-CM

## 2023-08-25 DIAGNOSIS — M23342 Other meniscus derangements, anterior horn of lateral meniscus, left knee: Secondary | ICD-10-CM | POA: Diagnosis not present

## 2023-08-26 DIAGNOSIS — H5213 Myopia, bilateral: Secondary | ICD-10-CM | POA: Diagnosis not present

## 2023-08-29 ENCOUNTER — Telehealth: Payer: Self-pay | Admitting: *Deleted

## 2023-08-29 NOTE — Telephone Encounter (Signed)
Copied from CRM [id number]. Topic: Appointments - Scheduling Inquiry for Clinic >> Aug 28, 2023  3:45 PM Lisa Haley wrote: Reason for CRM: Patient called about upcoming appt. Wanted to know if she would also be able to get TB test needed for work. Thank You

## 2023-08-29 NOTE — Telephone Encounter (Signed)
Appt scheduled for Thursday February 20.  Patient would not be able to get a TB skin test due to protocol reading.  Has to be read after 48-72 hours.

## 2023-08-30 ENCOUNTER — Other Ambulatory Visit (HOSPITAL_COMMUNITY): Payer: Self-pay

## 2023-08-30 ENCOUNTER — Other Ambulatory Visit: Payer: Self-pay | Admitting: Internal Medicine

## 2023-08-30 ENCOUNTER — Ambulatory Visit: Payer: Commercial Managed Care - HMO | Admitting: Physician Assistant

## 2023-08-30 ENCOUNTER — Encounter (HOSPITAL_COMMUNITY): Payer: Self-pay | Admitting: Pharmacist

## 2023-08-30 DIAGNOSIS — R0602 Shortness of breath: Secondary | ICD-10-CM

## 2023-08-31 ENCOUNTER — Other Ambulatory Visit: Payer: Self-pay

## 2023-08-31 ENCOUNTER — Ambulatory Visit: Payer: Medicaid Other | Admitting: Surgical

## 2023-09-12 ENCOUNTER — Ambulatory Visit: Payer: Medicaid Other | Admitting: Surgical

## 2023-09-12 ENCOUNTER — Other Ambulatory Visit: Payer: Self-pay | Admitting: Internal Medicine

## 2023-09-12 ENCOUNTER — Ambulatory Visit: Payer: Commercial Managed Care - HMO | Attending: Physician Assistant | Admitting: Physician Assistant

## 2023-09-12 ENCOUNTER — Other Ambulatory Visit (HOSPITAL_COMMUNITY): Payer: Self-pay

## 2023-09-12 VITALS — BP 141/99 | HR 97 | Temp 98.0°F | Ht 62.0 in | Wt 239.0 lb

## 2023-09-12 DIAGNOSIS — J111 Influenza due to unidentified influenza virus with other respiratory manifestations: Secondary | ICD-10-CM | POA: Diagnosis not present

## 2023-09-12 DIAGNOSIS — R141 Gas pain: Secondary | ICD-10-CM

## 2023-09-12 DIAGNOSIS — Z111 Encounter for screening for respiratory tuberculosis: Secondary | ICD-10-CM | POA: Diagnosis not present

## 2023-09-12 DIAGNOSIS — R110 Nausea: Secondary | ICD-10-CM | POA: Diagnosis not present

## 2023-09-12 DIAGNOSIS — R0602 Shortness of breath: Secondary | ICD-10-CM

## 2023-09-12 DIAGNOSIS — G629 Polyneuropathy, unspecified: Secondary | ICD-10-CM | POA: Diagnosis not present

## 2023-09-12 DIAGNOSIS — R1030 Lower abdominal pain, unspecified: Secondary | ICD-10-CM | POA: Diagnosis not present

## 2023-09-12 DIAGNOSIS — M62838 Other muscle spasm: Secondary | ICD-10-CM | POA: Diagnosis not present

## 2023-09-12 DIAGNOSIS — G894 Chronic pain syndrome: Secondary | ICD-10-CM

## 2023-09-12 LAB — POC COVID19/FLU A&B COMBO
Covid Antigen, POC: NEGATIVE
Influenza A Antigen, POC: NEGATIVE
Influenza B Antigen, POC: NEGATIVE

## 2023-09-12 MED ORDER — CYCLOBENZAPRINE HCL 10 MG PO TABS
5.0000 mg | ORAL_TABLET | Freq: Two times a day (BID) | ORAL | 1 refills | Status: DC | PRN
Start: 2023-09-12 — End: 2023-11-21

## 2023-09-12 MED ORDER — OSELTAMIVIR PHOSPHATE 75 MG PO CAPS: 75.0000 mg | ORAL_CAPSULE | Freq: Two times a day (BID) | ORAL | 0 refills | Status: DC

## 2023-09-12 MED ORDER — PROMETHAZINE-DM 6.25-15 MG/5ML PO SYRP: 5.0000 mL | ORAL_SOLUTION | Freq: Four times a day (QID) | ORAL | 0 refills | Status: DC | PRN

## 2023-09-12 NOTE — Progress Notes (Signed)
 Patient ID: Lisa Haley, female   DOB: 03-02-1976, 48 y.o.   MRN: [id number]   Zhanae Proffit, is a 48 y.o. female  WUJ:[id number]  [id number]  DOB - 30-Nov-1975  Chief Complaint  Patient presents with   Hypertension    HTN f/u. Requesting TB skin test for work Discuss flu vax        Subjective:   Lisa Haley is a 48 y.o. female here today for several issues.  She c/o cramping abdominal pain for several months.  It feels like gas pains.  It seems to occur most often when walking around. Some nausea.  No fever.  + nausea.  No vomiting.  No melena or hematochezia.    Also was exposed to flu by her kids.  2 day h/o harsh cough, body aches.  Sneezing and runny nose.    She has insurance now and and wants referral to pain clinic.    No problems updated.  ALLERGIES: No Known Allergies  PAST MEDICAL HISTORY: Past Medical History:  Diagnosis Date   Anemia    after hysterectomy   Arthritis    Chronic hypertension    GERD (gastroesophageal reflux disease)    Hypertension    Obesity    Preeclampsia    Stroke (HCC) 2021   SAH, admitted at [facility] 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    MEDICATIONS AT HOME: Prior to Admission medications   Medication Sig Start Date End Date Taking? Authorizing Provider  acetaminophen-codeine (TYLENOL #3) 300-30 MG tablet Take 1 tablet by mouth at bedtime as needed for moderate pain (pain score 4-6). 06/06/23  Yes Magnant, Charles L, PA-C  albuterol (VENTOLIN HFA) 108 (90 Base) MCG/ACT inhaler Inhale 2 puffs into the lungs every 6 (six) hours as needed for wheezing or shortness of breath. 09/04/22  Yes Marcine Matar, MD  amLODipine (NORVASC) 10 MG tablet Take 1 tablet (10 mg total) by mouth daily. 01/24/23  Yes Anders Simmonds, PA-C  cetirizine (ZYRTEC) 10 MG tablet Take 1 tablet (10 mg total) by mouth daily. 03/19/23  Yes Marcine Matar, MD  DULoxetine (CYMBALTA) 20 MG capsule Take 1 capsule (20 mg total) by mouth daily. 01/24/23  Yes  Anders Simmonds, PA-C  gabapentin (NEURONTIN) 300 MG capsule Take 1 capsule (300 mg total) by mouth at bedtime. 01/24/23  Yes Sayda Grable, Mar[place], PA-C  meloxicam (MOBIC) 15 MG tablet Take 1 tablet (15 mg total) by mouth daily. 04/02/23  Yes Marcine Matar, MD  meloxicam (MOBIC) 7.5 MG tablet Take 1 tablet (7.5 mg total) by mouth daily as needed for pain 01/24/23  Yes Anders Simmonds, PA-C  oseltamivir (TAMIFLU) 75 MG capsule Take 1 capsule (75 mg total) by mouth 2 (two) times daily. 09/12/23  Yes Anders Simmonds, PA-C  promethazine-dextromethorphan (PROMETHAZINE-DM) 6.25-15 MG/5ML syrup Take 5 mLs by mouth 4 (four) times daily as needed for cough. 09/12/23  Yes Georgian Co M, PA-C  valsartan (DIOVAN) 40 MG tablet Take 1 tablet (40 mg total) by mouth daily. 01/24/23  Yes Georgian Co M, PA-C  cyclobenzaprine (FLEXERIL) 10 MG tablet Take 0.5 tablets (5 mg total) by mouth 2 (two) times daily as needed for muscle spasms. 09/12/23   Anders Simmonds, PA-C    ROS: Neg cardiac Neg GI Neg GU Neg MS Neg psych Neg neuro  Objective:   Vitals:   09/12/23 0838  BP: (!) 141/99  Pulse: 97  Temp: 98 F (36.7 C)  TempSrc: Oral  SpO2: 97%  Weight: 239  lb (108.4 kg)  Height: 5\' 2"  (1.575 m)   Exam General appearance : Awake, alert, not in any distress. Speech Clear. Not toxic looking HEENT: Atraumatic and Normocephalic, nose running, coughing throughout visit Neck: Supple, no JVD. No cervical lymphadenopathy.  Chest: fair air entry bilaterally,  No rales/rhonchi.  But there is diffuse wheezing-mild to moderate throughout CVS: S1 S2 regular, no murmurs.  Abdomen: Bowel sounds present, Non tender and not distended with no gaurding, rigidity or rebound. Extremities: B/L Lower Ext shows no edema, both legs are warm to touch Neurology: Awake alert, and oriented X 3, CN II-XII intact, Non focal Skin: No Rash  Data Review Lab Results  Component Value Date   HGBA1C 5.3 07/07/2020     Assessment & Plan   1. Gas pain (Primary) Non-acute abdomen - H. pylori breath test - CMP14+EGFR  2. Lower abdominal pain Long-standing(flexeril has helped) - cyclobenzaprine (FLEXERIL) 10 MG tablet; Take 0.5 tablets (5 mg total) by mouth 2 (two) times daily as needed for muscle spasms.  Dispense: 40 tablet; Refill: 1  3. Neuropathy - cyclobenzaprine (FLEXERIL) 10 MG tablet; Take 0.5 tablets (5 mg total) by mouth 2 (two) times daily as needed for muscle spasms.  Dispense: 40 tablet; Refill: 1 - Ambulatory referral to Pain Clinic  4. Muscle spasm of left lower extremity - cyclobenzaprine (FLEXERIL) 10 MG tablet; Take 0.5 tablets (5 mg total) by mouth 2 (two) times daily as needed for muscle spasms.  Dispense: 40 tablet; Refill: 1 - Ambulatory referral to Pain Clinic  5. Nausea Phenergan with codeine for nausea and cough  6. Chronic pain syndrome - CMP14+EGFR - Ambulatory referral to Pain Clinic  7. Flu Fluids, rest, respiratory - promethazine-dextromethorphan (PROMETHAZINE-DM) 6.25-15 MG/5ML syrup; Take 5 mLs by mouth 4 (four) times daily as needed for cough.  Dispense: 118 mL; Refill: 0 - oseltamivir (TAMIFLU) 75 MG capsule; Take 1 capsule (75 mg total) by mouth 2 (two) times daily.  Dispense: 10 capsule; Refill: 0  TB screening-read in 48 to 72 hours  Return in about 3 months (around 12/13/2023) for PCP for chronic conditions-Dr Laural Benes.  The patient was given clear instructions to go to ER or return to medical center if symptoms don't improve, worsen or new problems develop. The patient verbalized understanding. The patient was told to call to get lab results if they haven't heard anything in the next week.      Georgian Co, PA-C [facility] [place], [place] [phone]   09/12/2023, 9:03 AM

## 2023-09-13 ENCOUNTER — Telehealth: Payer: Self-pay | Admitting: Internal Medicine

## 2023-09-13 ENCOUNTER — Other Ambulatory Visit: Payer: Self-pay | Admitting: Physician Assistant

## 2023-09-13 ENCOUNTER — Encounter: Payer: Self-pay | Admitting: Physician Assistant

## 2023-09-13 DIAGNOSIS — A048 Other specified bacterial intestinal infections: Secondary | ICD-10-CM

## 2023-09-13 LAB — CMP14+EGFR
ALT: 22 IU/L (ref 0–32)
AST: 20 IU/L (ref 0–40)
Albumin: 3.3 g/dL — ABNORMAL LOW (ref 3.9–4.9)
Alkaline Phosphatase: 80 IU/L (ref 44–121)
BUN/Creatinine Ratio: 7 — ABNORMAL LOW (ref 9–23)
BUN: 9 mg/dL (ref 6–24)
Bilirubin Total: 0.2 mg/dL (ref 0.0–1.2)
CO2: 19 mmol/L — ABNORMAL LOW (ref 20–29)
Calcium: 8.8 mg/dL (ref 8.7–10.2)
Chloride: 111 mmol/L — ABNORMAL HIGH (ref 96–106)
Creatinine, Ser: 1.22 mg/dL — ABNORMAL HIGH (ref 0.57–1.00)
Globulin, Total: 2.5 g/dL (ref 1.5–4.5)
Glucose: 97 mg/dL (ref 70–99)
Potassium: 3.9 mmol/L (ref 3.5–5.2)
Sodium: 145 mmol/L — ABNORMAL HIGH (ref 134–144)
Total Protein: 5.8 g/dL — ABNORMAL LOW (ref 6.0–8.5)
eGFR: 55 mL/min/{1.73_m2} — ABNORMAL LOW (ref >59)

## 2023-09-13 LAB — H. PYLORI BREATH TEST: H pylori Breath Test: POSITIVE — AB

## 2023-09-13 MED ORDER — AMOXICILLIN 500 MG PO CAPS: 1000.0000 mg | ORAL_CAPSULE | Freq: Two times a day (BID) | ORAL | 0 refills | Status: AC

## 2023-09-13 MED ORDER — CLARITHROMYCIN 500 MG PO TABS
500.0000 mg | ORAL_TABLET | Freq: Two times a day (BID) | ORAL | 0 refills | Status: DC
Start: 2023-09-13 — End: 2024-01-26

## 2023-09-13 MED ORDER — PANTOPRAZOLE SODIUM 40 MG PO TBEC: 40.0000 mg | DELAYED_RELEASE_TABLET | Freq: Every day | ORAL | 3 refills | Status: DC

## 2023-09-13 NOTE — Telephone Encounter (Signed)
 Copied from CRM [id number]. Topic: Clinical - Medication Question >> Sep 13, 2023  4:27 PM Clide Dales wrote: Patient states that she was in the office yesterday and saw Marylene Land. Patient states that Medicaid would not pay for promethazine-dextromethorphan (PROMETHAZINE-DM) 6.25-15 MG/5ML syrup and wants to know if there is an alternative that can be sent in. Please advise.

## 2023-09-14 ENCOUNTER — Other Ambulatory Visit: Payer: Self-pay | Admitting: Physician Assistant

## 2023-09-14 ENCOUNTER — Ambulatory Visit: Attending: Internal Medicine

## 2023-09-14 DIAGNOSIS — Z111 Encounter for screening for respiratory tuberculosis: Secondary | ICD-10-CM | POA: Diagnosis not present

## 2023-09-14 LAB — TB SKIN TEST
Induration: 0 mm
TB Skin Test: NEGATIVE

## 2023-09-14 MED ORDER — BENZONATATE 100 MG PO CAPS: 200.0000 mg | ORAL_CAPSULE | Freq: Three times a day (TID) | ORAL | 0 refills | Status: DC | PRN

## 2023-09-14 NOTE — Progress Notes (Signed)
 PPD Reading Note  PPD read and results entered in EpicCare.  Result: 0 mm induration.  Interpretation: negative  Allergic reaction: no

## 2023-09-14 NOTE — Telephone Encounter (Signed)
 Patient aware that her PCP called something something else in for her.  If insurance does not cover it, she can also try over the counter cough medications. Patient voiced understanding.

## 2023-09-19 ENCOUNTER — Other Ambulatory Visit (HOSPITAL_COMMUNITY): Payer: Self-pay

## 2023-09-19 ENCOUNTER — Ambulatory Visit: Admitting: Surgical

## 2023-09-23 ENCOUNTER — Other Ambulatory Visit: Payer: Self-pay | Admitting: Physician Assistant

## 2023-09-23 DIAGNOSIS — J111 Influenza due to unidentified influenza virus with other respiratory manifestations: Secondary | ICD-10-CM

## 2023-09-25 NOTE — Progress Notes (Signed)
 Need nonurgent appt to discuss MRI results at some point

## 2023-09-27 ENCOUNTER — Telehealth: Payer: Self-pay

## 2023-09-27 NOTE — Telephone Encounter (Signed)
 Protein level was checked when she had blood test done earlier this month when she saw our PAP Georgian Co. Level was low normal but improved from previous.

## 2023-09-27 NOTE — Telephone Encounter (Signed)
 Copied from CRM [id number]. Topic: Appointments - Scheduling Inquiry for Clinic >> Sep 26, 2023  1:10 PM Shardie S wrote: Reason for CRM: Patient requesting to have labs to have her protein levels check. Please contact patient with instructions. Callback # [phone]

## 2023-09-27 NOTE — Telephone Encounter (Signed)
 Called but no answer. LVM to call back.

## 2023-10-01 NOTE — Telephone Encounter (Signed)
 Yes

## 2023-10-01 NOTE — Telephone Encounter (Signed)
 Called spoke to the patient. Verified name & DOB. Informed of results. Patient expressed verbal understanding. Lisa Haley would like to know if she is able to donate plasma. Please advise.

## 2023-10-02 NOTE — Telephone Encounter (Signed)
 Called & spoke to the patient. Verified name & DOB. Informed of Dr.Johnson's approval to donate plasma. Patient would like for a form to be completed. Informed patient to drop off the form at the front desk. Patient expressed verbal understanding of all discussed.

## 2023-10-07 ENCOUNTER — Other Ambulatory Visit: Payer: Self-pay | Admitting: Internal Medicine

## 2023-10-07 ENCOUNTER — Other Ambulatory Visit: Payer: Self-pay | Admitting: Physician Assistant

## 2023-10-07 DIAGNOSIS — I10 Essential (primary) hypertension: Secondary | ICD-10-CM

## 2023-10-07 DIAGNOSIS — R0602 Shortness of breath: Secondary | ICD-10-CM

## 2023-10-07 DIAGNOSIS — G8929 Other chronic pain: Secondary | ICD-10-CM

## 2023-10-07 DIAGNOSIS — J111 Influenza due to unidentified influenza virus with other respiratory manifestations: Secondary | ICD-10-CM

## 2023-10-07 DIAGNOSIS — M79641 Pain in right hand: Secondary | ICD-10-CM

## 2023-10-08 ENCOUNTER — Other Ambulatory Visit (HOSPITAL_COMMUNITY): Payer: Self-pay

## 2023-10-08 ENCOUNTER — Other Ambulatory Visit: Payer: Self-pay

## 2023-10-08 MED ORDER — PROMETHAZINE-DM 6.25-15 MG/5ML PO SYRP
5.0000 mL | ORAL_SOLUTION | Freq: Four times a day (QID) | ORAL | 0 refills | Status: DC | PRN
  Filled 2023-10-08 – 2023-10-09 (×3): qty 118, 6d supply, fill #0

## 2023-10-09 ENCOUNTER — Other Ambulatory Visit: Payer: Self-pay

## 2023-10-09 ENCOUNTER — Other Ambulatory Visit (HOSPITAL_COMMUNITY): Payer: Self-pay

## 2023-10-09 MED ORDER — VALSARTAN 40 MG PO TABS
40.0000 mg | ORAL_TABLET | Freq: Every day | ORAL | 3 refills | Status: DC
  Filled 2023-10-09 – 2023-10-12 (×3): qty 30, 30d supply, fill #0
  Filled 2023-11-09: qty 30, 30d supply, fill #1
  Filled 2023-12-09: qty 30, 30d supply, fill #2

## 2023-10-09 MED ORDER — DULOXETINE HCL 20 MG PO CPEP
20.0000 mg | ORAL_CAPSULE | Freq: Every day | ORAL | 3 refills | Status: DC
  Filled 2023-10-09 – 2023-10-12 (×2): qty 30, 30d supply, fill #0
  Filled 2023-11-06: qty 30, 30d supply, fill #1
  Filled 2023-12-09: qty 30, 30d supply, fill #2
  Filled 2024-02-01: qty 30, 30d supply, fill #3

## 2023-10-09 MED ORDER — ALBUTEROL SULFATE HFA 108 (90 BASE) MCG/ACT IN AERS
2.0000 | INHALATION_SPRAY | Freq: Four times a day (QID) | RESPIRATORY_TRACT | 2 refills | Status: AC | PRN
  Filled 2023-10-09 – 2023-10-12 (×2): qty 18, 25d supply, fill #0
  Filled 2023-10-12: qty 18, 30d supply, fill #0
  Filled 2024-02-20 (×2): qty 18, 25d supply, fill #1
  Filled 2024-02-28 – 2024-06-21 (×2): qty 18, 25d supply, fill #2
  Filled 2024-07-28: qty 6.7, 21d supply, fill #3

## 2023-10-09 MED ORDER — CETIRIZINE HCL 10 MG PO TABS
10.0000 mg | ORAL_TABLET | Freq: Every day | ORAL | 2 refills | Status: DC
  Filled 2023-10-09 – 2023-10-12 (×3): qty 30, 30d supply, fill #0
  Filled 2023-11-09: qty 30, 30d supply, fill #1
  Filled 2023-12-09: qty 30, 30d supply, fill #2

## 2023-10-12 ENCOUNTER — Telehealth: Payer: Self-pay | Admitting: Internal Medicine

## 2023-10-12 ENCOUNTER — Ambulatory Visit: Admitting: Nurse Practitioner

## 2023-10-12 ENCOUNTER — Other Ambulatory Visit: Payer: Self-pay | Admitting: Internal Medicine

## 2023-10-12 ENCOUNTER — Other Ambulatory Visit: Payer: Self-pay

## 2023-10-12 ENCOUNTER — Other Ambulatory Visit (HOSPITAL_COMMUNITY): Payer: Self-pay

## 2023-10-12 DIAGNOSIS — G8929 Other chronic pain: Secondary | ICD-10-CM

## 2023-10-12 DIAGNOSIS — M79641 Pain in right hand: Secondary | ICD-10-CM

## 2023-10-12 NOTE — Telephone Encounter (Signed)
 Pt drop off form (plasma form )to be complete.. pt knows 7-14 days policy.[name] they will call her once its done

## 2023-10-17 ENCOUNTER — Ambulatory Visit: Admitting: Orthopedic Surgery

## 2023-10-18 ENCOUNTER — Other Ambulatory Visit: Payer: Self-pay

## 2023-10-18 NOTE — Telephone Encounter (Signed)
 Copied from CRM [id number]. Topic: General - Call Back - No Documentation >> Oct 18, 2023 11:17 AM Pascal Lux wrote:  Reason for CRM: Patient calling to see if paper work is ready to come pick up or should she wait until her next visit?

## 2023-10-19 ENCOUNTER — Telehealth: Payer: Self-pay | Admitting: Internal Medicine

## 2023-10-19 NOTE — Telephone Encounter (Signed)
 Noted.

## 2023-10-19 NOTE — Telephone Encounter (Signed)
 I spoke with patient yesterday letting her know that based on her blood protein level, she did not meet their criteria for donation of plasma.

## 2023-10-19 NOTE — Telephone Encounter (Signed)
 Phone call placed to patient yesterday morning regarding form that she had dropped off to get approval to donate plasma.  The form came from a company called [employer].  The form stated that for the donor to be suitable to donate plasma, donors protein must fall between 6-7.  They note that her 2 most recent serum protein levels were in the 5.  I advised patient of this and let her know that she does not meet their criteria to donate plasma.  Patient expressed understanding.  Encourage good nutrition.

## 2023-10-22 ENCOUNTER — Ambulatory Visit: Admitting: Orthopedic Surgery

## 2023-10-25 ENCOUNTER — Ambulatory Visit: Payer: Self-pay

## 2023-10-25 ENCOUNTER — Other Ambulatory Visit: Payer: Self-pay

## 2023-10-25 NOTE — Telephone Encounter (Signed)
 Noted.

## 2023-10-25 NOTE — Telephone Encounter (Signed)
 Copied from CRM [id number]. Topic: Clinical - Red Word Triage >> Oct 25, 2023 10:45 AM Star East wrote: Red Word that prompted transfer to Nurse Triage: Lisa Haley two days ago and landed on right hip and now have pain and bruising, hard to walk- pain level 10   Chief Complaint: Hip Pain, Right Symptoms: Right Hip Pain Frequency: Acute, Two Days Pertinent Negatives: Patient denies weakness, numbness, or inability Disposition: [] ED /[x] Urgent Care (no appt availability in office) / [] Appointment(In office/virtual)/ []  [facility]/ [] Home Care/ [] Refused Recommended Disposition /[] [facility]/ []  Follow-up with PCP Additional Notes: DC is being triaged for a post fall injury to the right hip.  The patient states she is in pain to the hip that she fell on, and that she can walk but the pain does limit her ability. No in office availability, recommended Urgent Care, patient verbalized understanding, and agreed to disposition.  Reason for Disposition  [1] MODERATE weakness (i.e., interferes with work, school, normal activities) AND [2] new-onset or worsening  Answer Assessment - Initial Assessment Questions 1. MECHANISM: "How did the fall happen?"     Tripped over uneven ground and fell on the cement  2. DOMESTIC VIOLENCE AND ELDER ABUSE SCREENING: "Did you fall because someone pushed you or tried to hurt you?" If Yes, ask: "Are you safe now?"     No  3. ONSET: "When did the fall happen?" (e.g., minutes, hours, or days ago)     Two Days  4. LOCATION: "What part of the body hit the ground?" (e.g., back, buttocks, head, hips, knees, hands, head, stomach)     Right Hip  5. INJURY: "Did you hurt (injure) yourself when you fell?" If Yes, ask: "What did you injure? Tell me more about this?" (e.g., body area; type of injury; pain severity)"     Right Hip, Pain  6. PAIN: "Is there any pain?" If Yes, ask: "How bad is the pain?" (e.g., Scale 1-10; or mild,  moderate, severe)   -  NONE (0): No pain   - MILD (1-3): Doesn't interfere with normal activities    - MODERATE (4-7): Interferes with normal activities or awakens from sleep    - SEVERE (8-10): Excruciating pain, unable to do any normal activities      Severe  7. SIZE: For cuts, bruises, or swelling, ask: "How large is it?" (e.g., inches or centimeters)      None  8. PREGNANCY: "Is there any chance you are pregnant?" "When was your last menstrual period?"     No and No  9. OTHER SYMPTOMS: "Do you have any other symptoms?" (e.g., dizziness, fever, weakness; new onset or worsening).      Weakness  10. CAUSE: "What do you think caused the fall (or falling)?" (e.g., tripped, dizzy spell)       Uneven terrain  Protocols used: Falls and [facility]

## 2023-10-25 NOTE — Telephone Encounter (Signed)
 Will forward for an recommendations   Lisa Haley could we contact pt and schedule an appt with any available provider

## 2023-10-26 ENCOUNTER — Other Ambulatory Visit (HOSPITAL_COMMUNITY): Payer: Self-pay

## 2023-10-26 ENCOUNTER — Other Ambulatory Visit: Payer: Self-pay

## 2023-10-30 ENCOUNTER — Ambulatory Visit: Admitting: Family Medicine

## 2023-11-06 ENCOUNTER — Ambulatory Visit: Admitting: Nurse Practitioner

## 2023-11-07 ENCOUNTER — Other Ambulatory Visit (HOSPITAL_COMMUNITY): Payer: Self-pay

## 2023-11-07 ENCOUNTER — Ambulatory Visit (INDEPENDENT_AMBULATORY_CARE_PROVIDER_SITE_OTHER): Admitting: Surgical

## 2023-11-07 DIAGNOSIS — M25462 Effusion, left knee: Secondary | ICD-10-CM

## 2023-11-07 MED ORDER — ACETAMINOPHEN-CODEINE 300-30 MG PO TABS: 1.0000 | ORAL_TABLET | Freq: Every evening | ORAL | 0 refills | Status: DC | PRN

## 2023-11-07 MED ORDER — MELOXICAM 15 MG PO TABS: 15.0000 mg | ORAL_TABLET | Freq: Every day | ORAL | 0 refills | Status: DC | PRN

## 2023-11-09 ENCOUNTER — Other Ambulatory Visit: Payer: Self-pay | Admitting: Physician Assistant

## 2023-11-09 ENCOUNTER — Other Ambulatory Visit (HOSPITAL_COMMUNITY): Payer: Self-pay

## 2023-11-09 ENCOUNTER — Other Ambulatory Visit: Payer: Self-pay

## 2023-11-09 DIAGNOSIS — I10 Essential (primary) hypertension: Secondary | ICD-10-CM

## 2023-11-09 MED ORDER — AMLODIPINE BESYLATE 10 MG PO TABS
10.0000 mg | ORAL_TABLET | Freq: Every day | ORAL | 1 refills | Status: DC
  Filled 2023-11-09: qty 30, 30d supply, fill #0
  Filled 2023-12-09: qty 30, 30d supply, fill #1

## 2023-11-11 ENCOUNTER — Encounter: Payer: Self-pay | Admitting: Surgical

## 2023-11-11 NOTE — Progress Notes (Signed)
 Office Visit Note   Patient: Lisa Haley           Date of Birth: 1976/05/02           MRN: [id number] Visit Date: 11/07/2023 Requested by: Vicci Barnie NOVAK, MD [address] [place] [address] [place],  [place] [address] PCP: Vicci Barnie NOVAK, MD  Subjective: Chief Complaint  Patient presents with   Left Knee - Pain, Follow-up    MRI review    HPI: Lisa Haley is a 48 y.o. female who presents to the office for MRI review. Patient denies any changes in symptoms.  Continues to complain mainly of left knee pain.  Mass majority of her pain is anterior medial with some radiation down the shin.  Does not really have any significant lateral sided pain.  She has chronic clicking and popping in the left knee.  Has never had gel injection before.  Has had cortisone injection that gave her about 3 weeks of relief.  She is keeping up with a regular walking program but if she does too much activity, this will cause increased pain in the knee.              ROS: All systems reviewed are negative as they relate to the chief complaint within the history of present illness.  Patient denies fevers or chills.  Assessment & Plan: Visit Diagnoses:  1. Effusion, left knee     Plan: Lisa Haley is a 48 y.o. female who presents to the office for review of left knee MRI scan.  MRI demonstrates lateral meniscus tear but no such medial meniscus pathology to explain her medial sided pain.  There is some early arthritic changes in all 3 compartments which may be the main pain driver for her.  With lack of lateral symptoms, we discussed options available for patient.  She decided she would like to think over her options.  I tried to contact her in order to better discuss next steps over the phone but could not get a hold of her despite trying about 5 times.  We will see her back as needed when she returns to the office and wants to pursue interventions for her knee pain.  Follow-Up Instructions: No  follow-ups on file.   Orders:  No orders of the defined types were placed in this encounter.  Meds ordered this encounter  Medications   meloxicam  (MOBIC ) 15 MG tablet    Sig: Take 1 tablet (15 mg total) by mouth daily as needed for pain.    Dispense:  30 tablet    Refill:  0   acetaminophen -codeine  (TYLENOL  #3) 300-30 MG tablet    Sig: Take 1 tablet by mouth at bedtime as needed for moderate pain (pain score 4-6).    Dispense:  14 tablet    Refill:  0      Procedures: No procedures performed   Clinical Data: No additional findings.  Objective: Vital Signs: There were no vitals taken for this visit.  Physical Exam:  Constitutional: Patient appears well-developed HEENT:  Head: Normocephalic Eyes:EOM are normal Neck: Normal range of motion Cardiovascular: Normal rate Pulmonary/chest: Effort normal Neurologic: Patient is alert Skin: Skin is warm Psychiatric: Patient has normal mood and affect  Ortho Exam: Ortho exam demonstrates left knee with trace effusion.  Tenderness over the medial joint line moderately with really no significant lateral joint line tenderness.  She has no calf tenderness.  Negative Homans' sign.  No tenderness over the pes anserine  bursa.  Stable to anterior and posterior drawer sign.  Stable to varus and valgus stress at 0 and 30 degrees.  No pain with hip range of motion.  Able to perform straight leg raise.  Specialty Comments:  No specialty comments available.  Imaging: No results found.   PMFS History: Patient Active Problem List   Diagnosis Date Noted   Pain in right shoulder 09/22/2022   Pain in left knee 09/22/2022   Pelvic pain in female 07/26/2022   Adnexal mass 01/24/2022   BMI 45.0-49.9, adult (HCC) 01/24/2022   History of stroke 01/24/2022   Neuropathy 04/21/2021   Left hip pain 04/21/2021   SOB (shortness of breath) 04/21/2021   Migraine without aura and without status migrainosus, not intractable 08/06/2020   Essential  hypertension 08/06/2020   Tobacco dependence 08/06/2020   Class 3 severe obesity due to excess calories with serious comorbidity and body mass index (BMI) of 45.0 to 49.9 in adult 08/06/2020   SAH (subarachnoid hemorrhage) (HCC) 07/06/2020   Past Medical History:  Diagnosis Date   Anemia    after hysterectomy   Arthritis    Chronic hypertension    GERD (gastroesophageal reflux disease)    Hypertension    Obesity    Preeclampsia    Stroke (HCC) 2021   SAH, admitted at [facility] 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    Family History  Problem Relation Age of Onset   Ovarian cancer Neg Hx    Uterine cancer Neg Hx    Breast cancer Neg Hx     Past Surgical History:  Procedure Laterality Date   ABDOMINAL HYSTERECTOMY     in 30s for heavy bleeding, RSO   IR ANGIO INTRA EXTRACRAN SEL INTERNAL CAROTID BILAT MOD SED  07/06/2020   IR ANGIO INTRA EXTRACRAN SEL INTERNAL CAROTID BILAT MOD SED  07/12/2020   IR ANGIO VERTEBRAL SEL VERTEBRAL BILAT MOD SED  07/06/2020   IR ANGIO VERTEBRAL SEL VERTEBRAL BILAT MOD SED  07/12/2020   RADIOLOGY WITH ANESTHESIA N/A 07/06/2020   Procedure: IR WITH ANESTHESIA;  Surgeon: Lanis Pupa, MD;  Location: [facility] OR;  Service: Radiology;  Laterality: N/A;   ROBOTIC ASSISTED BILATERAL SALPINGO OOPHERECTOMY N/A 02/14/2022   Procedure: XI ROBOTIC ASSISTED UNILATERAL SALPINGO OOPHORECTOMY, LYSIS OF ADHESIONS, LEFT URETEROLYSIS;  Surgeon: Viktoria Comer SAUNDERS, MD;  Location: [facility] ORS;  Service: Gynecology;  Laterality: N/A;   TUBAL LIGATION     WRIST SURGERY Left    for carpal tunnel   Social History   Occupational History   Not on file  Tobacco Use   Smoking status: Every Day    Current packs/day: 0.25    Average packs/day: 0.3 packs/day for 20.0 years (5.0 ttl pk-yrs)    Types: Cigarettes   Smokeless tobacco: Never  Vaping Use   Vaping status: Never Used  Substance and Sexual Activity   Alcohol use: Yes    Comment: once a week   Drug use: Yes    Types: Marijuana     Comment: 3-4x week   Sexual activity: Yes    Birth control/protection: Surgical    Comment: btl

## 2023-11-13 ENCOUNTER — Other Ambulatory Visit (HOSPITAL_COMMUNITY): Payer: Self-pay

## 2023-11-19 ENCOUNTER — Encounter (HOSPITAL_COMMUNITY): Payer: Self-pay

## 2023-11-19 ENCOUNTER — Other Ambulatory Visit (HOSPITAL_COMMUNITY): Payer: Self-pay

## 2023-11-21 ENCOUNTER — Telehealth: Payer: Self-pay | Admitting: Internal Medicine

## 2023-11-21 ENCOUNTER — Other Ambulatory Visit: Payer: Self-pay | Admitting: Physician Assistant

## 2023-11-21 DIAGNOSIS — R1030 Lower abdominal pain, unspecified: Secondary | ICD-10-CM

## 2023-11-21 DIAGNOSIS — G629 Polyneuropathy, unspecified: Secondary | ICD-10-CM

## 2023-11-21 DIAGNOSIS — M62838 Other muscle spasm: Secondary | ICD-10-CM

## 2023-11-21 NOTE — Telephone Encounter (Signed)
 Needs to be seen

## 2023-11-21 NOTE — Telephone Encounter (Signed)
Called but no answer. LVM to call back for further clarification.

## 2023-11-21 NOTE — Telephone Encounter (Signed)
 Copied from CRM [id number]. Topic: Referral - Question >> Nov 21, 2023 10:00 AM Lorenz Romano B wrote:  Reason for CRM:pt want to know if she cn get a new   pain management referral

## 2023-11-21 NOTE — Telephone Encounter (Signed)
 Pt called in , states she needs a new referral,because she missed 2 appt and is being denied ot be seen unless she gets a new referral

## 2023-11-22 NOTE — Telephone Encounter (Signed)
 Has appt 6/9.  At this time, no sooner availability.

## 2023-12-02 ENCOUNTER — Other Ambulatory Visit: Payer: Self-pay

## 2023-12-10 ENCOUNTER — Other Ambulatory Visit (HOSPITAL_COMMUNITY): Payer: Self-pay

## 2023-12-11 ENCOUNTER — Telehealth: Payer: Self-pay | Admitting: Surgical

## 2023-12-11 NOTE — Telephone Encounter (Signed)
 Patient called said she received a call and she was returning the call in regards to the third option for what can done about her knee. The insurance didn't approve for the gel shot. CB#[phone]

## 2023-12-12 ENCOUNTER — Other Ambulatory Visit (HOSPITAL_COMMUNITY): Payer: Self-pay

## 2023-12-17 ENCOUNTER — Ambulatory Visit: Admitting: Internal Medicine

## 2023-12-24 NOTE — Telephone Encounter (Signed)
 I have now called this patient about 3-4 times to discuss options for her knee.  Gel injections are not approved and cortisone injections have not been helpful.  She has primarily medial sided pain and there is 1 image on the MRI scan that does suggest some medial meniscal pathology.  Reviewed this with Dr. Rozelle Corning and wanted to discuss with Andi that we could pursue diagnostic arthroscopy with potential meniscal debridement based on what we find at the time of surgery.  Other options for her would be to work with physical therapy and see if this helps.  If she calls back, I can try and call her back but it is fairly difficult to reach her.

## 2023-12-24 NOTE — Telephone Encounter (Signed)
 noted

## 2024-01-15 ENCOUNTER — Ambulatory Visit: Payer: Self-pay

## 2024-01-15 NOTE — Telephone Encounter (Signed)
 Patient called on the number listed to call [phone] and the friend who answered asked if I could call her back in about 30 minutes, she's is not with the patient at the moment and the patient uses her phone. Advised to have the patient to call back if she gets to her before 6 pm or she can call tomorrow if after 6 pm. The friend verbalized understanding.

## 2024-01-15 NOTE — Telephone Encounter (Signed)
 Patient has been fatigue and having stomach issues that she can't really explain//patient is also having night sweats//She is on a friends phone because she no longer has one//she called from [phone]   Nurse attempt to contact patient: no answer: left voicemail.

## 2024-01-16 NOTE — Telephone Encounter (Signed)
 Noted

## 2024-01-21 ENCOUNTER — Ambulatory Visit: Payer: Self-pay

## 2024-01-21 NOTE — Telephone Encounter (Signed)
 FYI Only or Action Required?: Action required by provider: request for appointment.  Patient was last seen in primary care on 09/12/2023 by Danton Jon HERO, PA-C.  Called Nurse Triage reporting Abdominal Pain.  Symptoms began several months ago.  Interventions attempted: Prescription medications: Flexeril .  Symptoms are: gradually worsening.  Triage Disposition: See PCP Within 2 Weeks  Patient/caregiver understands and will follow disposition?: Yes                             Copied from CRM [id number]. Topic: Clinical - Red Word Triage >> Jan 21, 2024  9:38 AM Gustabo D wrote: Patient is having bad stomach problems its bad when she does everyday things it puts a strain on her stomach. It causes her to sweat really bad and she can't do anything but lay down or sit down until it passes. Reason for Disposition  Abdominal pain is a chronic symptom (recurrent or ongoing AND present > 4 weeks)  Answer Assessment - Initial Assessment Questions 1. LOCATION: Where does it hurt?      Stomach in general  2. RADIATION: Does the pain shoot anywhere else? (e.g., chest, back)     Denies 3. ONSET: When did the pain begin? (e.g., minutes, hours or days ago)      Seen in office for symptoms on 09/12/23, states symptoms have been worsening  4. SUDDEN: Gradual or sudden onset?     Gradual, ongoing for months 5. PATTERN Does the pain come and go, or is it constant?     Lasts for a few days, states pain starts after going to plasma  6. SEVERITY: How bad is the pain?  (e.g., Scale 1-10; mild, moderate, or severe)     States stomach is not painful, feels more like stress of strain 7. RECURRENT SYMPTOM: Have you ever had this type of stomach pain before? If Yes, ask: When was the last time? and What happened that time?      Ongoing for a few months 8. CAUSE: What do you think is causing the stomach pain? (e.g., gallstones, recent abdominal surgery)      Unsure, was seen and treated in office on 09/12/23 9. RELIEVING/AGGRAVATING FACTORS: What makes it better or worse? (e.g., antacids, bending or twisting motion, bowel movement)     Physical exertion and stress 10. OTHER SYMPTOMS: Do you have any other symptoms? (e.g., back pain, diarrhea, fever, urination pain, vomiting)       Nausea at times, sweating, denies vomiting    No available appointments until August. Please advise on scheduling. Patient requesting call back at [phone].  Protocols used: Abdominal Pain - Female-A-AH

## 2024-01-21 NOTE — Telephone Encounter (Signed)
 Please schedule patient.

## 2024-01-25 ENCOUNTER — Emergency Department (HOSPITAL_COMMUNITY)

## 2024-01-25 ENCOUNTER — Inpatient Hospital Stay (HOSPITAL_COMMUNITY)
Admission: EM | Admit: 2024-01-25 | Discharge: 2024-01-29 | DRG: 281 | Disposition: A | Discharge status: Home / Self Care | Attending: Internal Medicine | Admitting: Internal Medicine

## 2024-01-25 DIAGNOSIS — E785 Hyperlipidemia, unspecified: Secondary | ICD-10-CM | POA: Diagnosis present

## 2024-01-25 DIAGNOSIS — Z90710 Acquired absence of both cervix and uterus: Secondary | ICD-10-CM

## 2024-01-25 DIAGNOSIS — F1410 Cocaine abuse, uncomplicated: Secondary | ICD-10-CM | POA: Diagnosis present

## 2024-01-25 DIAGNOSIS — Z5941 Food insecurity: Secondary | ICD-10-CM

## 2024-01-25 DIAGNOSIS — R1013 Epigastric pain: Secondary | ICD-10-CM | POA: Diagnosis not present

## 2024-01-25 DIAGNOSIS — I214 Non-ST elevation (NSTEMI) myocardial infarction: Principal | ICD-10-CM | POA: Diagnosis present

## 2024-01-25 DIAGNOSIS — Z5986 Financial insecurity: Secondary | ICD-10-CM

## 2024-01-25 DIAGNOSIS — Z8679 Personal history of other diseases of the circulatory system: Secondary | ICD-10-CM

## 2024-01-25 DIAGNOSIS — I472 Ventricular tachycardia, unspecified: Secondary | ICD-10-CM | POA: Diagnosis present

## 2024-01-25 DIAGNOSIS — R1111 Vomiting without nausea: Secondary | ICD-10-CM | POA: Diagnosis not present

## 2024-01-25 DIAGNOSIS — K5730 Diverticulosis of large intestine without perforation or abscess without bleeding: Secondary | ICD-10-CM | POA: Diagnosis present

## 2024-01-25 DIAGNOSIS — I10 Essential (primary) hypertension: Secondary | ICD-10-CM | POA: Diagnosis not present

## 2024-01-25 DIAGNOSIS — F17210 Nicotine dependence, cigarettes, uncomplicated: Secondary | ICD-10-CM | POA: Diagnosis present

## 2024-01-25 DIAGNOSIS — Z79899 Other long term (current) drug therapy: Secondary | ICD-10-CM

## 2024-01-25 DIAGNOSIS — N1832 Chronic kidney disease, stage 3b: Secondary | ICD-10-CM | POA: Diagnosis present

## 2024-01-25 DIAGNOSIS — E66813 Obesity, class 3: Secondary | ICD-10-CM | POA: Diagnosis present

## 2024-01-25 DIAGNOSIS — Z6841 Body Mass Index (BMI) 40.0 and over, adult: Secondary | ICD-10-CM

## 2024-01-25 DIAGNOSIS — F17200 Nicotine dependence, unspecified, uncomplicated: Secondary | ICD-10-CM | POA: Diagnosis present

## 2024-01-25 DIAGNOSIS — M549 Dorsalgia, unspecified: Secondary | ICD-10-CM | POA: Diagnosis not present

## 2024-01-25 DIAGNOSIS — M1990 Unspecified osteoarthritis, unspecified site: Secondary | ICD-10-CM | POA: Diagnosis present

## 2024-01-25 DIAGNOSIS — Z5948 Other specified lack of adequate food: Secondary | ICD-10-CM

## 2024-01-25 DIAGNOSIS — I129 Hypertensive chronic kidney disease with stage 1 through stage 4 chronic kidney disease, or unspecified chronic kidney disease: Secondary | ICD-10-CM | POA: Diagnosis present

## 2024-01-25 DIAGNOSIS — D72829 Elevated white blood cell count, unspecified: Secondary | ICD-10-CM | POA: Diagnosis present

## 2024-01-25 DIAGNOSIS — G4440 Drug-induced headache, not elsewhere classified, not intractable: Secondary | ICD-10-CM | POA: Diagnosis not present

## 2024-01-25 DIAGNOSIS — R110 Nausea: Secondary | ICD-10-CM | POA: Diagnosis not present

## 2024-01-25 DIAGNOSIS — Z5982 Transportation insecurity: Secondary | ICD-10-CM

## 2024-01-25 DIAGNOSIS — I161 Hypertensive emergency: Secondary | ICD-10-CM | POA: Diagnosis present

## 2024-01-25 DIAGNOSIS — R0902 Hypoxemia: Secondary | ICD-10-CM | POA: Diagnosis not present

## 2024-01-25 DIAGNOSIS — R079 Chest pain, unspecified: Secondary | ICD-10-CM | POA: Diagnosis not present

## 2024-01-25 DIAGNOSIS — Z791 Long term (current) use of non-steroidal anti-inflammatories (NSAID): Secondary | ICD-10-CM

## 2024-01-25 DIAGNOSIS — G8929 Other chronic pain: Secondary | ICD-10-CM | POA: Diagnosis present

## 2024-01-25 DIAGNOSIS — Z8619 Personal history of other infectious and parasitic diseases: Secondary | ICD-10-CM

## 2024-01-25 DIAGNOSIS — T463X5A Adverse effect of coronary vasodilators, initial encounter: Secondary | ICD-10-CM | POA: Diagnosis not present

## 2024-01-25 DIAGNOSIS — Z8673 Personal history of transient ischemic attack (TIA), and cerebral infarction without residual deficits: Secondary | ICD-10-CM

## 2024-01-25 DIAGNOSIS — K219 Gastro-esophageal reflux disease without esophagitis: Secondary | ICD-10-CM | POA: Diagnosis present

## 2024-01-25 DIAGNOSIS — N179 Acute kidney failure, unspecified: Secondary | ICD-10-CM | POA: Diagnosis present

## 2024-01-25 DIAGNOSIS — R0789 Other chest pain: Secondary | ICD-10-CM | POA: Diagnosis not present

## 2024-01-25 HISTORY — DX: Cannabis abuse, uncomplicated: F12.10

## 2024-01-25 HISTORY — DX: Body mass index (BMI) 40.0-44.9, adult: E66.01

## 2024-01-25 HISTORY — DX: Hyperlipidemia, unspecified: E78.5

## 2024-01-25 HISTORY — DX: Other psychoactive substance abuse, uncomplicated: F19.10

## 2024-01-25 HISTORY — DX: Cocaine abuse, uncomplicated: F14.10

## 2024-01-25 MED ORDER — MORPHINE SULFATE (PF) 4 MG/ML IV SOLN
4.0000 mg | Freq: Once | INTRAVENOUS | Status: AC
  Administered 2024-01-26: 4 mg via INTRAVENOUS
  Filled 2024-01-25: qty 1

## 2024-01-25 MED ORDER — PANTOPRAZOLE SODIUM 40 MG IV SOLR
40.0000 mg | Freq: Once | INTRAVENOUS | Status: AC
  Administered 2024-01-26: 40 mg via INTRAVENOUS
  Filled 2024-01-25: qty 10

## 2024-01-25 MED ORDER — ONDANSETRON HCL 4 MG/2ML IJ SOLN
4.0000 mg | Freq: Once | INTRAMUSCULAR | Status: AC
  Administered 2024-01-26: 4 mg via INTRAVENOUS
  Filled 2024-01-25: qty 2

## 2024-01-25 NOTE — ED Triage Notes (Signed)
 Patient arrived with EMS from home reports central chest pain  burning onset yesterday with pain across abdomen and upper back pain with emesis , received 1 NTG sl and ASA 324 mg po by EMS prior to arrival , CBG=[id number].

## 2024-01-25 NOTE — ED Provider Notes (Signed)
  EMERGENCY DEPARTMENT AT [facility] Provider Note   CSN: [id number] Arrival date & time: 01/25/24  2339     Patient presents with: Chest Pain and Abdominal Pain   Lisa Haley is a 48 y.o. female.   HPI     This is a 48 year old female who presents with chest and abdominal pain.  Patient has a history of chronic abdominal pain.  Patient states that over the last several hours she has had increasing upper abdominal pain that is sharp that radiates into the chest and is also burning in nature.  She has not tried to eat.  Nothing seems to make it better or worse.  She has had some alcohol today and has used cocaine.  Reports nausea and vomiting.  Reports normal bowel movements.  No fevers.  Denies shortness of breath.  Prior to Admission medications   Medication Sig Start Date End Date Taking? Authorizing Provider  acetaminophen -codeine  (TYLENOL  #3) 300-30 MG tablet Take 1 tablet by mouth at bedtime as needed for moderate pain (pain score 4-6). 11/07/23  Yes Magnant, Carlin CROME, PA-C  albuterol  (VENTOLIN  HFA) 108 (90 Base) MCG/ACT inhaler Inhale 2 puffs into the lungs every 6 (six) hours as needed for wheezing or shortness of breath. 10/09/23  Yes Vicci Barnie NOVAK, MD  amLODipine  (NORVASC ) 10 MG tablet Take 1 tablet (10 mg total) by mouth daily. 11/09/23  Yes Vicci Barnie NOVAK, MD  gabapentin  (NEURONTIN ) 300 MG capsule Take 1 capsule (300 mg total) by mouth at bedtime. 01/24/23  Yes McClung, Angela M, PA-C  meloxicam  (MOBIC ) 15 MG tablet Take 1 tablet (15 mg total) by mouth daily as needed for pain. 11/07/23  Yes Magnant, Charles L, PA-C  valsartan  (DIOVAN ) 40 MG tablet Take 1 tablet (40 mg total) by mouth daily. 10/09/23  Yes Vicci Barnie NOVAK, MD  cetirizine  (ZYRTEC ) 10 MG tablet Take 1 tablet (10 mg total) by mouth daily. Patient not taking: Reported on 01/26/2024 10/09/23   Vicci Barnie NOVAK, MD  cyclobenzaprine  (FLEXERIL ) 10 MG tablet TAKE 1/2 TABLET (5 MG TOTAL)  BY MOUTH 2 (TWO) TIMES DAILY AS NEEDED FOR MUSCLE SPASMS. Patient not taking: Reported on 01/26/2024 11/21/23   Vicci Barnie NOVAK, MD  DULoxetine  (CYMBALTA ) 20 MG capsule Take 1 capsule (20 mg total) by mouth daily. Patient not taking: Reported on 01/26/2024 10/09/23   Vicci Barnie NOVAK, MD  pantoprazole  (PROTONIX ) 40 MG tablet Take 1 tablet (40 mg total) by mouth daily. Patient not taking: Reported on 01/26/2024 09/13/23   Danton Jon HERO, PA-C  promethazine -dextromethorphan (PROMETHAZINE -DM) 6.25-15 MG/5ML syrup Take 5 mLs by mouth 4 (four) times daily as needed for cough. Patient not taking: Reported on 01/26/2024 10/08/23   Newlin, Enobong, MD    Allergies: Patient has no known allergies.    Review of Systems  Constitutional:  Negative for fever.  Respiratory:  Negative for shortness of breath.   Cardiovascular:  Positive for chest pain.  Gastrointestinal:  Positive for abdominal pain, nausea and vomiting. Negative for diarrhea.  All other systems reviewed and are negative.   Updated Vital Signs BP (!) 188/107   Pulse 76   Temp 97.6 F (36.4 C) (Oral)   Resp 19   Ht 1.575 m (5' 2)   Wt 108.4 kg   SpO2 100%   BMI 43.71 kg/m   Physical Exam Vitals and nursing note reviewed.  Constitutional:      Appearance: She is well-developed. She is obese. She is ill-appearing. She is  not toxic-appearing.  HENT:     Head: Normocephalic and atraumatic.  Eyes:     Pupils: Pupils are equal, round, and reactive to light.  Cardiovascular:     Rate and Rhythm: Normal rate and regular rhythm.     Heart sounds: Normal heart sounds.  Pulmonary:     Effort: Pulmonary effort is normal. No respiratory distress.     Breath sounds: No wheezing.  Abdominal:     General: Bowel sounds are normal.     Palpations: Abdomen is soft.     Tenderness: There is abdominal tenderness.     Comments: Epigastric tenderness to palpation without rebound or guarding  Musculoskeletal:     Cervical back: Neck  supple.  Skin:    General: Skin is warm and dry.  Neurological:     Mental Status: She is alert and oriented to person, place, and time.  Psychiatric:        Mood and Affect: Mood normal.     (all labs ordered are listed, but only abnormal results are displayed) Labs Reviewed  CBC WITH DIFFERENTIAL/PLATELET - Abnormal; Notable for the following components:      Result Value   WBC 12.9 (*)    Neutro Abs 9.0 (*)    Abs Immature Granulocytes 0.36 (*)    All other components within normal limits  COMPREHENSIVE METABOLIC PANEL WITH GFR - Abnormal; Notable for the following components:   CO2 16 (*)    Glucose, Bld 201 (*)    Creatinine, Ser 1.65 (*)    Calcium  8.7 (*)    Total Protein 5.3 (*)    Albumin 2.9 (*)    GFR, Estimated 38 (*)    All other components within normal limits  TROPONIN I (HIGH SENSITIVITY) - Abnormal; Notable for the following components:   Troponin I (High Sensitivity) 59 (*)    All other components within normal limits  TROPONIN I (HIGH SENSITIVITY) - Abnormal; Notable for the following components:   Troponin I (High Sensitivity) 606 (*)    All other components within normal limits  LIPASE, BLOOD  ETHANOL  HEPARIN  LEVEL (UNFRACTIONATED)  TROPONIN I (HIGH SENSITIVITY)  TROPONIN I (HIGH SENSITIVITY)    EKG: EKG Interpretation Date/Time:  Saturday January 26 2024 06:21:36 EDT Ventricular Rate:  64 PR Interval:  157 QRS Duration:  90 QT Interval:  440 QTC Calculation: 454 R Axis:   6  Text Interpretation: Sinus rhythm Nonspecific T abnormalities, inferior leads Confirmed by Bari Pfeiffer ([id number]) on 01/26/2024 6:25:27 AM  Radiology: CT ABDOMEN PELVIS W CONTRAST Result Date: 01/26/2024 CLINICAL DATA:  Abdominal pain. EXAM: CT ABDOMEN AND PELVIS WITH CONTRAST TECHNIQUE: Multidetector CT imaging of the abdomen and pelvis was performed using the standard protocol following bolus administration of intravenous contrast. RADIATION DOSE REDUCTION: This exam  was performed according to the departmental dose-optimization program which includes automated exposure control, adjustment of the mA and/or kV according to patient size and/or use of iterative reconstruction technique. CONTRAST:  75mL OMNIPAQUE  IOHEXOL  350 MG/ML SOLN COMPARISON:  July 09, 2022 FINDINGS: Lower chest: No acute abnormality. Hepatobiliary: No focal liver abnormality is seen. No gallstones, gallbladder wall thickening, or biliary dilatation. Pancreas: Unremarkable. No pancreatic ductal dilatation or surrounding inflammatory changes. Spleen: Normal in size without focal abnormality. Adrenals/Urinary Tract: There is mild, stable diffuse left adrenal gland enlargement. The right adrenal gland is unremarkable. Kidneys are normal, without renal calculi, focal lesion, or hydronephrosis. Bladder is unremarkable. Stomach/Bowel: Stomach is within normal limits. Appendix appears normal.  No evidence of bowel wall thickening, distention, or inflammatory changes. Noninflamed diverticula are seen throughout the descending and proximal to mid sigmoid colon. Vascular/Lymphatic: Aortic atherosclerosis. No enlarged abdominal or pelvic lymph nodes. Reproductive: Status post hysterectomy. No adnexal masses. Other: No abdominal wall hernia or abnormality. No abdominopelvic ascites. Musculoskeletal: No acute or significant osseous findings. IMPRESSION: 1. Colonic diverticulosis. 2. Evidence of prior hysterectomy. 3. Aortic atherosclerosis. Electronically Signed   By: Suzen Dials M.D.   On: 01/26/2024 02:05   DG Chest Portable 1 View Result Date: 01/26/2024 CLINICAL DATA:  Chest pain. EXAM: PORTABLE CHEST 1 VIEW COMPARISON:  August 08, 2016 FINDINGS: The cardiac silhouette is borderline in size. Low lung volumes are noted with subsequent crowding of the bronchovascular lung markings. No acute infiltrate, pleural effusion or pneumothorax is identified. The visualized skeletal structures are unremarkable.  IMPRESSION: Low lung volumes without acute or active cardiopulmonary disease. Electronically Signed   By: Suzen Dials M.D.   On: 01/26/2024 00:16     .Critical Care  Performed by: Bari Charmaine FALCON, MD Authorized by: Bari Charmaine FALCON, MD   Critical care provider statement:    Critical care time (minutes):  45   Critical care was necessary to treat or prevent imminent or life-threatening deterioration of the following conditions: NSTEMI.   Critical care was time spent personally by me on the following activities:  Development of treatment plan with patient or surrogate, discussions with consultants, evaluation of patient's response to treatment, examination of patient, ordering and review of laboratory studies, ordering and review of radiographic studies, ordering and performing treatments and interventions, pulse oximetry, re-evaluation of patient's condition and review of old charts    Medications Ordered in the ED  heparin  ADULT infusion 100 units/mL (25000 units/250mL) (1,000 Units/hr Intravenous New Bag/Given 01/26/24 0657)  morphine  (PF) 4 MG/ML injection 4 mg (4 mg Intravenous Given 01/26/24 0027)  ondansetron  (ZOFRAN ) injection 4 mg (4 mg Intravenous Given 01/26/24 0027)  pantoprazole  (PROTONIX ) injection 40 mg (40 mg Intravenous Given 01/26/24 0027)  sodium chloride  0.9 % bolus 1,000 mL (1,000 mLs Intravenous New Bag/Given 01/26/24 0220)  iohexol  (OMNIPAQUE ) 350 MG/ML injection 75 mL (75 mLs Intravenous Contrast Given 01/26/24 0157)  alum & mag hydroxide-simeth (MAALOX/MYLANTA) 200-200-20 MG/5ML suspension 30 mL (30 mLs Oral Given 01/26/24 0242)  amLODipine  (NORVASC ) tablet 10 mg (10 mg Oral Given 01/26/24 0416)  nitroGLYCERIN  (NITROGLYN) 2 % ointment 1 inch (1 inch Topical Given 01/26/24 0634)  hydrALAZINE  (APRESOLINE ) injection 5 mg (5 mg Intravenous Given 01/26/24 0634)  morphine  (PF) 4 MG/ML injection 4 mg (4 mg Intravenous Given 01/26/24 0636)  heparin  bolus via infusion 4,000  Units (4,000 Units Intravenous Bolus from Bag 01/26/24 0654)    Clinical Course as of 01/26/24 0734  Sat Jan 26, 2024  0622 Initial troponin negative.  Repeat troponin slightly elevated.  Could be related to cocaine use or high blood pressure.  Will repeat.  Much of her history is suggestive of GI etiology.  Patient was given home dose of Norvasc .  Repeat troponin ordered.  Repeat troponin 606.  Patient was started on IV heparin .  Continues to be hypertensive.  Nitropaste applied and she was given a dose of IV hydralazine .  Will consult cardiology. [CH]  [phone] Cardiology will evaluate later today.  Agree with current treatment plan. [CH]    Clinical Course User Index [CH] Lawonda Pretlow, Charmaine FALCON, MD  Medical Decision Making Amount and/or Complexity of Data Reviewed Labs: ordered. Radiology: ordered.  Risk OTC drugs. Prescription drug management. Decision regarding hospitalization.   This patient presents to the ED for concern of abdominal pain, chest pain, this involves an extensive number of treatment options, and is a complaint that carries with it a high risk of complications and morbidity.  I considered the following differential and admission for this acute, potentially life threatening condition.  The differential diagnosis includes gastritis, gastroenteritis, peptic ulcer, ACS, PE, pneumothorax, pneumonia  MDM:    This is a 48 year old female who presents with abdominal and chest pain.  She has some chronic abdominal pain history and was treated in March for H. pylori.  Also reports cocaine and alcohol use tonight.  EKG no evidence of ST elevation.  Initial troponin negative.  Initial lab work with slight leukocytosis to 12.9.  Creatinine is 1.65 which is slightly elevated from her baseline.  Lipase normal.  CT abdomen pelvis shows some diverticulosis but no obvious other finding.  Repeat troponin slightly elevated at 59.  This could be related to cocaine  use.  Patient's blood pressure has also up trended.  She was given her p.o. dose of Norvasc .  Will repeat troponin 1 more time.  She is continuing to complain of epigastric discomfort.  However she is hungry and is able to p.o. challenge.  Third troponin is 606.  Again this could be multifactorial related to hypertension and/or cocaine abuse but could also represent an NSTEMI.  Second EKG was obtained without any dynamic changes.  Patient was started on IV heparin  and Nitropaste was applied as well as given IV hydralazine .  Will discuss with cardiology fellow.  She will require admission.  Could represent hypertensive urgency or emergency, true ACS.  Presentation multifactorial. (Labs, imaging, consults)  Labs: I Ordered, and personally interpreted labs.  The pertinent results include: CBC, CMP, lipase, troponin  Imaging Studies ordered: I ordered imaging studies including chest x-ray, CT abdomen pelvis I independently visualized and interpreted imaging. I agree with the radiologist interpretation  Additional history obtained from chart review.  External records from outside source obtained and reviewed including prior evaluations  Cardiac Monitoring: The patient was maintained on a cardiac monitor.  If on the cardiac monitor, I personally viewed and interpreted the cardiac monitored which showed an underlying rhythm of: Sinus  Reevaluation: After the interventions noted above, I reevaluated the patient and found that they have :stayed the same  Social Determinants of Health:  lives independently, drug abuse  Disposition: Admit  Co morbidities that complicate the patient evaluation  Past Medical History:  Diagnosis Date   Anemia    after hysterectomy   Arthritis    Chronic hypertension    GERD (gastroesophageal reflux disease)    Hypertension    Obesity    Preeclampsia    Stroke (HCC) 2021   SAH, admitted at [facility] 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     Medicines Meds ordered this  encounter  Medications   morphine  (PF) 4 MG/ML injection 4 mg   ondansetron  (ZOFRAN ) injection 4 mg   pantoprazole  (PROTONIX ) injection 40 mg   sodium chloride  0.9 % bolus 1,000 mL   iohexol  (OMNIPAQUE ) 350 MG/ML injection 75 mL   alum & mag hydroxide-simeth (MAALOX/MYLANTA) 200-200-20 MG/5ML suspension 30 mL   amLODipine  (NORVASC ) tablet 10 mg   nitroGLYCERIN  (NITROGLYN) 2 % ointment 1 inch   hydrALAZINE  (APRESOLINE ) injection 5 mg   morphine  (PF) 4 MG/ML injection 4 mg  heparin  bolus via infusion 4,000 Units   heparin  ADULT infusion 100 units/mL (25000 units/250mL)    I have reviewed the patients home medicines and have made adjustments as needed  Problem List / ED Course: Problem List Items Addressed This Visit   None Visit Diagnoses       NSTEMI (non-ST elevated myocardial infarction) (HCC)    -  Primary   Relevant Medications   amLODipine  (NORVASC ) tablet 10 mg (Completed)   nitroGLYCERIN  (NITROGLYN) 2 % ointment 1 inch (Completed)   hydrALAZINE  (APRESOLINE ) injection 5 mg (Completed)   heparin  bolus via infusion 4,000 Units (Completed)   heparin  ADULT infusion 100 units/mL (25000 units/250mL)     Cocaine abuse (HCC)         Uncontrolled hypertension       Relevant Medications   amLODipine  (NORVASC ) tablet 10 mg (Completed)   nitroGLYCERIN  (NITROGLYN) 2 % ointment 1 inch (Completed)   hydrALAZINE  (APRESOLINE ) injection 5 mg (Completed)   heparin  bolus via infusion 4,000 Units (Completed)   heparin  ADULT infusion 100 units/mL (25000 units/250mL)     Epigastric pain                    Final diagnoses:  NSTEMI (non-ST elevated myocardial infarction) (HCC)  Cocaine abuse (HCC)  Uncontrolled hypertension  Epigastric pain    ED Discharge Orders     None          Bari Charmaine FALCON, MD 01/26/24 [phone]

## 2024-01-26 ENCOUNTER — Emergency Department (HOSPITAL_COMMUNITY)

## 2024-01-26 ENCOUNTER — Inpatient Hospital Stay (HOSPITAL_COMMUNITY)

## 2024-01-26 DIAGNOSIS — K219 Gastro-esophageal reflux disease without esophagitis: Secondary | ICD-10-CM | POA: Diagnosis present

## 2024-01-26 DIAGNOSIS — K5730 Diverticulosis of large intestine without perforation or abscess without bleeding: Secondary | ICD-10-CM | POA: Diagnosis not present

## 2024-01-26 DIAGNOSIS — Z90710 Acquired absence of both cervix and uterus: Secondary | ICD-10-CM | POA: Diagnosis not present

## 2024-01-26 DIAGNOSIS — Z5941 Food insecurity: Secondary | ICD-10-CM | POA: Diagnosis not present

## 2024-01-26 DIAGNOSIS — N1831 Chronic kidney disease, stage 3a: Secondary | ICD-10-CM | POA: Diagnosis not present

## 2024-01-26 DIAGNOSIS — Z8619 Personal history of other infectious and parasitic diseases: Secondary | ICD-10-CM | POA: Diagnosis not present

## 2024-01-26 DIAGNOSIS — R0989 Other specified symptoms and signs involving the circulatory and respiratory systems: Secondary | ICD-10-CM | POA: Diagnosis not present

## 2024-01-26 DIAGNOSIS — F17210 Nicotine dependence, cigarettes, uncomplicated: Secondary | ICD-10-CM | POA: Diagnosis not present

## 2024-01-26 DIAGNOSIS — R0789 Other chest pain: Secondary | ICD-10-CM | POA: Diagnosis not present

## 2024-01-26 DIAGNOSIS — E785 Hyperlipidemia, unspecified: Secondary | ICD-10-CM | POA: Diagnosis not present

## 2024-01-26 DIAGNOSIS — I160 Hypertensive urgency: Secondary | ICD-10-CM | POA: Diagnosis not present

## 2024-01-26 DIAGNOSIS — F14988 Cocaine use, unspecified with other cocaine-induced disorder: Secondary | ICD-10-CM | POA: Diagnosis not present

## 2024-01-26 DIAGNOSIS — F1410 Cocaine abuse, uncomplicated: Secondary | ICD-10-CM | POA: Diagnosis not present

## 2024-01-26 DIAGNOSIS — I214 Non-ST elevation (NSTEMI) myocardial infarction: Secondary | ICD-10-CM | POA: Diagnosis not present

## 2024-01-26 DIAGNOSIS — R079 Chest pain, unspecified: Secondary | ICD-10-CM

## 2024-01-26 DIAGNOSIS — Z5982 Transportation insecurity: Secondary | ICD-10-CM | POA: Diagnosis not present

## 2024-01-26 DIAGNOSIS — I161 Hypertensive emergency: Secondary | ICD-10-CM | POA: Diagnosis not present

## 2024-01-26 DIAGNOSIS — Z8679 Personal history of other diseases of the circulatory system: Secondary | ICD-10-CM | POA: Diagnosis not present

## 2024-01-26 DIAGNOSIS — R1013 Epigastric pain: Secondary | ICD-10-CM | POA: Diagnosis not present

## 2024-01-26 DIAGNOSIS — N179 Acute kidney failure, unspecified: Secondary | ICD-10-CM | POA: Diagnosis not present

## 2024-01-26 DIAGNOSIS — I129 Hypertensive chronic kidney disease with stage 1 through stage 4 chronic kidney disease, or unspecified chronic kidney disease: Secondary | ICD-10-CM | POA: Diagnosis not present

## 2024-01-26 DIAGNOSIS — Z8673 Personal history of transient ischemic attack (TIA), and cerebral infarction without residual deficits: Secondary | ICD-10-CM | POA: Diagnosis not present

## 2024-01-26 DIAGNOSIS — Z5986 Financial insecurity: Secondary | ICD-10-CM | POA: Diagnosis not present

## 2024-01-26 DIAGNOSIS — D72829 Elevated white blood cell count, unspecified: Secondary | ICD-10-CM | POA: Diagnosis not present

## 2024-01-26 DIAGNOSIS — G8929 Other chronic pain: Secondary | ICD-10-CM | POA: Diagnosis not present

## 2024-01-26 DIAGNOSIS — M1990 Unspecified osteoarthritis, unspecified site: Secondary | ICD-10-CM | POA: Diagnosis present

## 2024-01-26 DIAGNOSIS — Z79899 Other long term (current) drug therapy: Secondary | ICD-10-CM | POA: Diagnosis not present

## 2024-01-26 DIAGNOSIS — I472 Ventricular tachycardia, unspecified: Secondary | ICD-10-CM | POA: Diagnosis not present

## 2024-01-26 DIAGNOSIS — R109 Unspecified abdominal pain: Secondary | ICD-10-CM | POA: Diagnosis not present

## 2024-01-26 DIAGNOSIS — Z6841 Body Mass Index (BMI) 40.0 and over, adult: Secondary | ICD-10-CM | POA: Diagnosis not present

## 2024-01-26 DIAGNOSIS — I10 Essential (primary) hypertension: Secondary | ICD-10-CM | POA: Diagnosis not present

## 2024-01-26 DIAGNOSIS — N1832 Chronic kidney disease, stage 3b: Secondary | ICD-10-CM | POA: Diagnosis not present

## 2024-01-26 DIAGNOSIS — E66813 Obesity, class 3: Secondary | ICD-10-CM | POA: Diagnosis not present

## 2024-01-26 LAB — CBC WITH DIFFERENTIAL/PLATELET
Abs Immature Granulocytes: 0.36 K/uL — ABNORMAL HIGH (ref 0.00–0.07)
Basophils Absolute: 0.1 K/uL (ref 0.0–0.1)
Basophils Relative: 1 %
Eosinophils Absolute: 0.2 K/uL (ref 0.0–0.5)
Eosinophils Relative: 2 %
HCT: 44 % (ref 36.0–46.0)
Hemoglobin: 14.6 g/dL (ref 12.0–15.0)
Immature Granulocytes: 3 %
Lymphocytes Relative: 19 %
Lymphs Abs: 2.4 K/uL (ref 0.7–4.0)
MCH: 32.2 pg (ref 26.0–34.0)
MCHC: 33.2 g/dL (ref 30.0–36.0)
MCV: 96.9 fL (ref 80.0–100.0)
Monocytes Absolute: 0.9 K/uL (ref 0.1–1.0)
Monocytes Relative: 7 %
Neutro Abs: 9 K/uL — ABNORMAL HIGH (ref 1.7–7.7)
Neutrophils Relative %: 68 %
Platelets: 321 K/uL (ref 150–400)
RBC: 4.54 MIL/uL (ref 3.87–5.11)
RDW: 13.9 % (ref 11.5–15.5)
WBC: 12.9 K/uL — ABNORMAL HIGH (ref 4.0–10.5)
nRBC: 0 % (ref 0.0–0.2)

## 2024-01-26 LAB — HIV ANTIBODY (ROUTINE TESTING W REFLEX): HIV Screen 4th Generation wRfx: NONREACTIVE

## 2024-01-26 LAB — MRSA NEXT GEN BY PCR, NASAL: MRSA by PCR Next Gen: DETECTED — AB

## 2024-01-26 LAB — MAGNESIUM: Magnesium: 2.3 mg/dL (ref 1.7–2.4)

## 2024-01-26 LAB — TROPONIN I (HIGH SENSITIVITY)
Troponin I (High Sensitivity): 12 ng/L (ref <18)
Troponin I (High Sensitivity): 59 ng/L — ABNORMAL HIGH (ref <18)
Troponin I (High Sensitivity): 606 ng/L (ref <18)
Troponin I (High Sensitivity): 1570 ng/L (ref <18)

## 2024-01-26 LAB — COMPREHENSIVE METABOLIC PANEL WITH GFR
ALT: 18 U/L (ref 0–44)
AST: 18 U/L (ref 15–41)
Albumin: 2.9 g/dL — ABNORMAL LOW (ref 3.5–5.0)
Alkaline Phosphatase: 54 U/L (ref 38–126)
Anion gap: 10 (ref 5–15)
BUN: 15 mg/dL (ref 6–20)
CO2: 16 mmol/L — ABNORMAL LOW (ref 22–32)
Calcium: 8.7 mg/dL — ABNORMAL LOW (ref 8.9–10.3)
Chloride: 111 mmol/L (ref 98–111)
Creatinine, Ser: 1.65 mg/dL — ABNORMAL HIGH (ref 0.44–1.00)
GFR, Estimated: 38 mL/min — ABNORMAL LOW (ref >60)
Glucose, Bld: 201 mg/dL — ABNORMAL HIGH (ref 70–99)
Potassium: 4.1 mmol/L (ref 3.5–5.1)
Sodium: 137 mmol/L (ref 135–145)
Total Bilirubin: 0.6 mg/dL (ref 0.0–1.2)
Total Protein: 5.3 g/dL — ABNORMAL LOW (ref 6.5–8.1)

## 2024-01-26 LAB — ECHOCARDIOGRAM COMPLETE
AR max vel: 2.55 cm2
AV Peak grad: 5.1 mmHg
Ao pk vel: 1.13 m/s
Area-P 1/2: 4.8 cm2
Height: 62 in
S' Lateral: 2.9 cm
Weight: 3824 [oz_av]

## 2024-01-26 LAB — HEPARIN LEVEL (UNFRACTIONATED): Heparin Unfractionated: 0.27 [IU]/mL — ABNORMAL LOW (ref 0.30–0.70)

## 2024-01-26 LAB — ETHANOL: Alcohol, Ethyl (B): <15 mg/dL (ref <15)

## 2024-01-26 LAB — TSH: TSH: 0.927 u[IU]/mL (ref 0.350–4.500)

## 2024-01-26 LAB — HEMOGLOBIN A1C
Hgb A1c MFr Bld: 5.3 % (ref 4.8–5.6)
Mean Plasma Glucose: 105.41 mg/dL

## 2024-01-26 LAB — LIPASE, BLOOD: Lipase: 25 U/L (ref 11–51)

## 2024-01-26 MED ORDER — HEPARIN (PORCINE) 25000 UT/250ML-% IV SOLN
1150.0000 [IU]/h | INTRAVENOUS | Status: DC
  Administered 2024-01-26: 1000 [IU]/h via INTRAVENOUS
  Administered 2024-01-27 – 2024-01-28 (×2): 1150 [IU]/h via INTRAVENOUS
  Filled 2024-01-26 (×3): qty 250

## 2024-01-26 MED ORDER — ATORVASTATIN CALCIUM 80 MG PO TABS
80.0000 mg | ORAL_TABLET | Freq: Every day | ORAL | Status: DC
  Administered 2024-01-26 – 2024-01-29 (×4): 80 mg via ORAL
  Filled 2024-01-26 (×4): qty 1

## 2024-01-26 MED ORDER — ACETAMINOPHEN 325 MG PO TABS
650.0000 mg | ORAL_TABLET | ORAL | Status: DC | PRN
  Administered 2024-01-27 – 2024-01-29 (×4): 650 mg via ORAL
  Filled 2024-01-26 (×4): qty 2

## 2024-01-26 MED ORDER — ALUM & MAG HYDROXIDE-SIMETH 200-200-20 MG/5ML PO SUSP
30.0000 mL | Freq: Once | ORAL | Status: AC
  Administered 2024-01-26: 30 mL via ORAL
  Filled 2024-01-26: qty 30

## 2024-01-26 MED ORDER — ONDANSETRON HCL 4 MG/2ML IJ SOLN: 4.0000 mg | Freq: Four times a day (QID) | INTRAMUSCULAR | Status: DC | PRN

## 2024-01-26 MED ORDER — CARVEDILOL 12.5 MG PO TABS
12.5000 mg | ORAL_TABLET | Freq: Two times a day (BID) | ORAL | Status: DC
  Administered 2024-01-26 (×2): 12.5 mg via ORAL
  Filled 2024-01-26 (×2): qty 1

## 2024-01-26 MED ORDER — SODIUM CHLORIDE 0.9 % IV BOLUS
1000.0000 mL | Freq: Once | INTRAVENOUS | Status: AC
  Administered 2024-01-26: 1000 mL via INTRAVENOUS

## 2024-01-26 MED ORDER — NITROGLYCERIN IN D5W 200-5 MCG/ML-% IV SOLN
0.0000 ug/min | INTRAVENOUS | Status: DC
  Administered 2024-01-26: 10 ug/min via INTRAVENOUS
  Administered 2024-01-27: 35 ug/min via INTRAVENOUS
  Filled 2024-01-26 (×3): qty 250

## 2024-01-26 MED ORDER — NITROGLYCERIN 2 % TD OINT
1.0000 [in_us] | TOPICAL_OINTMENT | Freq: Once | TRANSDERMAL | Status: AC
  Administered 2024-01-26: 1 [in_us] via TOPICAL
  Filled 2024-01-26: qty 1

## 2024-01-26 MED ORDER — HYDRALAZINE HCL 20 MG/ML IJ SOLN
5.0000 mg | Freq: Once | INTRAMUSCULAR | Status: AC
  Administered 2024-01-26: 5 mg via INTRAVENOUS
  Filled 2024-01-26: qty 1

## 2024-01-26 MED ORDER — TIZANIDINE HCL 4 MG PO TABS
2.0000 mg | ORAL_TABLET | Freq: Four times a day (QID) | ORAL | Status: DC | PRN
  Administered 2024-01-26 – 2024-01-29 (×7): 2 mg via ORAL
  Filled 2024-01-26 (×7): qty 1

## 2024-01-26 MED ORDER — HEPARIN BOLUS VIA INFUSION
4000.0000 [IU] | Freq: Once | INTRAVENOUS | Status: AC
  Administered 2024-01-26: 4000 [IU] via INTRAVENOUS
  Filled 2024-01-26: qty 4000

## 2024-01-26 MED ORDER — SODIUM CHLORIDE 0.9 % IV SOLN: INTRAVENOUS | Status: DC

## 2024-01-26 MED ORDER — CHLORHEXIDINE GLUCONATE CLOTH 2 % EX PADS
6.0000 | MEDICATED_PAD | Freq: Every day | CUTANEOUS | Status: DC
  Administered 2024-01-26 – 2024-01-28 (×3): 6 via TOPICAL

## 2024-01-26 MED ORDER — MUPIROCIN 2 % EX OINT
1.0000 | TOPICAL_OINTMENT | Freq: Two times a day (BID) | CUTANEOUS | Status: DC
  Administered 2024-01-26 – 2024-01-29 (×7): 1 via NASAL
  Filled 2024-01-26 (×2): qty 22

## 2024-01-26 MED ORDER — NITROGLYCERIN 0.4 MG SL SUBL: 0.4000 mg | SUBLINGUAL_TABLET | SUBLINGUAL | Status: DC | PRN

## 2024-01-26 MED ORDER — MORPHINE SULFATE (PF) 4 MG/ML IV SOLN
4.0000 mg | Freq: Once | INTRAVENOUS | Status: AC
  Administered 2024-01-26: 4 mg via INTRAVENOUS
  Filled 2024-01-26: qty 1

## 2024-01-26 MED ORDER — ALBUTEROL SULFATE (2.5 MG/3ML) 0.083% IN NEBU: 2.5000 mg | INHALATION_SOLUTION | Freq: Four times a day (QID) | RESPIRATORY_TRACT | Status: DC | PRN

## 2024-01-26 MED ORDER — AMLODIPINE BESYLATE 10 MG PO TABS
10.0000 mg | ORAL_TABLET | Freq: Every day | ORAL | Status: DC
  Administered 2024-01-27 – 2024-01-29 (×3): 10 mg via ORAL
  Filled 2024-01-26 (×3): qty 1

## 2024-01-26 MED ORDER — IOHEXOL 350 MG/ML SOLN
75.0000 mL | Freq: Once | INTRAVENOUS | Status: AC | PRN
  Administered 2024-01-26: 75 mL via INTRAVENOUS

## 2024-01-26 MED ORDER — ASPIRIN 81 MG PO TBEC
81.0000 mg | DELAYED_RELEASE_TABLET | Freq: Every day | ORAL | Status: DC
  Administered 2024-01-27 – 2024-01-29 (×2): 81 mg via ORAL
  Filled 2024-01-26 (×2): qty 1

## 2024-01-26 MED ORDER — PANTOPRAZOLE SODIUM 40 MG PO TBEC
40.0000 mg | DELAYED_RELEASE_TABLET | Freq: Two times a day (BID) | ORAL | Status: DC
  Administered 2024-01-26 – 2024-01-28 (×5): 40 mg via ORAL
  Filled 2024-01-26 (×5): qty 1

## 2024-01-26 MED ORDER — AMLODIPINE BESYLATE 5 MG PO TABS
10.0000 mg | ORAL_TABLET | Freq: Once | ORAL | Status: AC
  Administered 2024-01-26: 10 mg via ORAL
  Filled 2024-01-26: qty 2

## 2024-01-26 NOTE — ED Notes (Signed)
 Pt PO challenged 12oz water  and tolerated well

## 2024-01-26 NOTE — ED Notes (Signed)
 Patient left the floor in stable condition, with her belongings and staff.

## 2024-01-26 NOTE — Progress Notes (Signed)
 Echocardiogram 2D Echocardiogram has been performed.  Lisa Haley 01/26/2024, 11:24 AM

## 2024-01-26 NOTE — Progress Notes (Signed)
 PHARMACY - ANTICOAGULATION CONSULT NOTE  Pharmacy Consult for heparin  Indication: chest pain/ACS  No Known Allergies  Patient Measurements: Height: 5' 2 (157.5 cm) Weight: 108.4 kg (239 lb) IBW/kg (Calculated) : 50.1 HEPARIN  DW (KG): 76.4  Vital Signs: Temp: 97.6 F (36.4 C) (07/18 2345) Temp Source: Oral (07/18 2345) BP: 209/102 (07/19 0500) Pulse Rate: 69 (07/19 0345)  Labs: Recent Labs    01/25/24 2352 01/26/24 0227 01/26/24 0452  HGB 14.6  --   --   HCT 44.0  --   --   PLT 321  --   --   CREATININE 1.65*  --   --   TROPONINIHS 12 59* 606*    Estimated Creatinine Clearance: 48.8 mL/min (A) (by C-G formula based on SCr of 1.65 mg/dL (H)).   Medical History: Past Medical History:  Diagnosis Date   Anemia    after hysterectomy   Arthritis    Chronic hypertension    GERD (gastroesophageal reflux disease)    Hypertension    Obesity    Preeclampsia    Stroke (HCC) 2021   SAH, admitted at [facility] 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    Assessment: 48yo female c/o central CP associated w/ pain across upper back and abdomen as well as N/V, initial troponin negative but now rising (12>59>606) >> to begin heparin .  Goal of Therapy:  Heparin  level 0.3-0.7 units/ml Monitor platelets by anticoagulation protocol: Yes   Plan:  Heparin  4000 units IV bolus followed by infusion at 1000 units/hr. Monitor heparin  levels and CBC.  Marvetta Dauphin, PharmD, BCPS  01/26/2024,6:34 AM

## 2024-01-26 NOTE — Progress Notes (Signed)
 PHARMACY - ANTICOAGULATION CONSULT NOTE  Pharmacy Consult for heparin  Indication: chest pain/ACS  No Known Allergies  Patient Measurements: Height: 5' 2 (157.5 cm) Weight: 108.4 kg (239 lb) IBW/kg (Calculated) : 50.1 HEPARIN  DW (KG): 76.4  Vital Signs: Temp: 98.6 F (37 C) (07/19 1521) Temp Source: Oral (07/19 1521) BP: 159/83 (07/19 1521) Pulse Rate: 71 (07/19 1032)  Labs: Recent Labs    01/25/24 2352 01/26/24 0227 01/26/24 0452 01/26/24 0630 01/26/24 1420  HGB 14.6  --   --   --   --   HCT 44.0  --   --   --   --   PLT 321  --   --   --   --   HEPARINUNFRC  --   --   --   --  0.27*  CREATININE 1.65*  --   --   --   --   TROPONINIHS 12 59* 606* 1,570*  --     Estimated Creatinine Clearance: 48.8 mL/min (A) (by C-G formula based on SCr of 1.65 mg/dL (H)).   Medical History: Past Medical History:  Diagnosis Date   Anemia    after hysterectomy   Arthritis    Chronic hypertension    GERD (gastroesophageal reflux disease)    Hypertension    Obesity    Preeclampsia    Stroke (HCC) 2021   SAH, admitted at [facility] 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    Assessment: 48yo female c/o central CP associated w/ pain across upper back and abdomen as well as N/V, initial troponin negative but now rising (12>59>606) >> to begin heparin .  2nd: HL subtherapeutic (0.27) on heparin  1000u/hr. Confirmed with RN that heparin  running continuously/has not been paused or interrupted. RN reported oozing at IV site.   Goal of Therapy:  Heparin  level 0.3-0.7 units/ml Monitor platelets by anticoagulation protocol: Yes   Plan:  Increase heparin  to 1150 units/hr Check HL in 8 hours Monitor heparin  levels and CBC   Dionicia Canavan, PharmD PGY1 Acute Care Pharmacy Resident [facility]  01/26/2024 4:40 PM

## 2024-01-26 NOTE — ED Notes (Signed)
 MD notified and aware of pts current BP

## 2024-01-26 NOTE — Plan of Care (Signed)
  Problem: Education: Goal: Individualized Educational Video(s) Outcome: Progressing   Problem: Activity: Goal: Ability to tolerate increased activity will improve Outcome: Progressing   

## 2024-01-26 NOTE — ED Notes (Signed)
 Provider at bedside, aware on increased Troponin and high blood pressure.

## 2024-01-26 NOTE — Consult Note (Addendum)
 Cardiology Consultation   Patient ID: Lisa Haley MRN: [id number]; DOB: 1976/01/17  Admit date: 01/25/2024 Date of Consult: 01/26/2024  PCP:  Vicci Barnie NOVAK, MD   [facility] Providers Cardiologist:  None      (Dr Victory Sharps saw 01/2022)  Patient Profile: Lisa Haley is a 48 y.o. female with a hx of chest discomfort, longstanding over greater than 5 years as well as abdominal pain. Patient has history of smoking, obesity, GERD, prior ruptured subarachnoid cerebral aneurysm with CVA, hypertension, and obesity.    She was admitted 07/18 after using cocaine and ETOH, then developing N&V with sharp, burning CP.   Cards asked to see 01/26/2024 for the evaluation of chest pain at the request of Dr Bari.  History of Present Illness: Lisa Haley was seen by Dr Sharps in 2023 for CP, and preop clearance for robotic assisted unilateral salpingo oophorectomy, lysis of adhesions, L uterolysis. He recommended a Cardiac CT, but I can find no record of this.   Lisa Haley had an episode about 2 weeks ago where she did cocaine and had a burning chest pain.  She did not seek help and the symptoms eventually resolved.  Yesterday, she initially went for plasma donation.  After that, she normally gets some GI discomfort and belching, but she did not get that as usual yesterday.  After that, she did cocaine and she finished doing the cocaine, she developed severe burning chest pain.  The chest pain was a 10/10.  It was associated with palpitations, nausea, diaphoresis.  She called 911 and EMS gave her aspirin  which she vomited up.  That is the only time she vomited.  She has had a small amount of food since then but no nausea or vomiting now.  EMS gave her sublingual nitroglycerin  which she states helped the pain a little bit.  The ER gave her morphine  which also provided some relief.  She has also had nitroglycerin  paste applied.  Systolic blood pressure has been  as high as 223.  She got amlodipine  10 mg which is a home med at about 4 AM.  She has also had a as needed dose of hydralazine .  SBP is now 200.  She is currently having chest pain at a 4/10 but is not diaphoretic or nauseated.  She describes the pain as an ache but then also has burning.   Past Medical History:  Diagnosis Date   Anemia    after hysterectomy   Arthritis    Chronic hypertension    GERD (gastroesophageal reflux disease)    Hypertension    Obesity    Preeclampsia    Stroke (HCC) 2021   SAH, admitted at [facility] 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    Past Surgical History:  Procedure Laterality Date   ABDOMINAL HYSTERECTOMY     in 30s for heavy bleeding, RSO   IR ANGIO INTRA EXTRACRAN SEL INTERNAL CAROTID BILAT MOD SED  07/06/2020   IR ANGIO INTRA EXTRACRAN SEL INTERNAL CAROTID BILAT MOD SED  07/12/2020   IR ANGIO VERTEBRAL SEL VERTEBRAL BILAT MOD SED  07/06/2020   IR ANGIO VERTEBRAL SEL VERTEBRAL BILAT MOD SED  07/12/2020   RADIOLOGY WITH ANESTHESIA N/A 07/06/2020   Procedure: IR WITH ANESTHESIA;  Surgeon: Lanis Pupa, MD;  Location: [facility] OR;  Service: Radiology;  Laterality: N/A;   ROBOTIC ASSISTED BILATERAL SALPINGO OOPHERECTOMY N/A 02/14/2022   Procedure: XI ROBOTIC ASSISTED UNILATERAL SALPINGO OOPHORECTOMY, LYSIS OF ADHESIONS, LEFT URETEROLYSIS;  Surgeon: Viktoria Comer SAUNDERS, MD;  Location:  [facility] ORS;  Service: Gynecology;  Laterality: N/A;   TUBAL LIGATION     WRIST SURGERY Left    for carpal tunnel     Home Medications:  Prior to Admission medications   Medication Sig Start Date End Date Taking? Authorizing Provider  acetaminophen -codeine  (TYLENOL  #3) 300-30 MG tablet Take 1 tablet by mouth at bedtime as needed for moderate pain (pain score 4-6). 11/07/23  Yes Magnant, Carlin CROME, PA-C  albuterol  (VENTOLIN  HFA) 108 (90 Base) MCG/ACT inhaler Inhale 2 puffs into the lungs every 6 (six) hours as needed for wheezing or shortness of breath. 10/09/23  Yes Vicci Barnie NOVAK, MD   amLODipine  (NORVASC ) 10 MG tablet Take 1 tablet (10 mg total) by mouth daily. 11/09/23  Yes Vicci Barnie NOVAK, MD  gabapentin  (NEURONTIN ) 300 MG capsule Take 1 capsule (300 mg total) by mouth at bedtime. 01/24/23  Yes Danton Slough M, PA-C  meloxicam  (MOBIC ) 15 MG tablet Take 1 tablet (15 mg total) by mouth daily as needed for pain. 11/07/23  Yes Magnant, Carlin CROME, PA-C  valsartan  (DIOVAN ) 40 MG tablet Take 1 tablet (40 mg total) by mouth daily. 10/09/23  Yes Vicci Barnie NOVAK, MD  cetirizine  (ZYRTEC ) 10 MG tablet Take 1 tablet (10 mg total) by mouth daily. Patient not taking: Reported on 01/26/2024 10/09/23   Vicci Barnie NOVAK, MD  cyclobenzaprine  (FLEXERIL ) 10 MG tablet TAKE 1/2 TABLET (5 MG TOTAL) BY MOUTH 2 (TWO) TIMES DAILY AS NEEDED FOR MUSCLE SPASMS. Patient not taking: Reported on 01/26/2024 11/21/23   Vicci Barnie NOVAK, MD  DULoxetine  (CYMBALTA ) 20 MG capsule Take 1 capsule (20 mg total) by mouth daily. Patient not taking: Reported on 01/26/2024 10/09/23   Vicci Barnie NOVAK, MD  pantoprazole  (PROTONIX ) 40 MG tablet Take 1 tablet (40 mg total) by mouth daily. Patient not taking: Reported on 01/26/2024 09/13/23   Danton Slough HERO, PA-C  promethazine -dextromethorphan (PROMETHAZINE -DM) 6.25-15 MG/5ML syrup Take 5 mLs by mouth 4 (four) times daily as needed for cough. Patient not taking: Reported on 01/26/2024 10/08/23   Newlin, Enobong, MD    Scheduled Meds:  NOREEN ON 01/27/2024] amLODipine   10 mg Oral Daily   [START ON 01/27/2024] aspirin  EC  81 mg Oral Daily   atorvastatin   80 mg Oral Daily   carvedilol   12.5 mg Oral BID WC   pantoprazole   40 mg Oral BID   Continuous Infusions:  sodium chloride      heparin  1,000 Units/hr (01/26/24 0657)   nitroGLYCERIN  15 mcg/min (01/26/24 0901)   PRN Meds: acetaminophen , albuterol , nitroGLYCERIN , ondansetron  (ZOFRAN ) IV  Allergies:   No Known Allergies  Social History:   Social History   Socioeconomic History   Marital status: Single    Spouse  name: Not on file   Number of children: Not on file   Years of education: Not on file   Highest education level: GED or equivalent  Occupational History   Not on file  Tobacco Use   Smoking status: Every Day    Current packs/day: 0.25    Average packs/day: 0.3 packs/day for 20.0 years (5.0 ttl pk-yrs)    Types: Cigarettes   Smokeless tobacco: Never  Vaping Use   Vaping status: Never Used  Substance and Sexual Activity   Alcohol use: Yes    Comment: once a week   Drug use: Yes    Types: Marijuana    Comment: 3-4x week   Sexual activity: Yes    Birth control/protection: Surgical    Comment: btl  Other Topics Concern   Not on file  Social History Narrative   Not on file   Social Drivers of Health   Financial Resource Strain: Medium Risk (09/12/2023)   Overall Financial Resource Strain (CARDIA)    Difficulty of Paying Living Expenses: Somewhat hard  Food Insecurity: Food Insecurity Present (09/12/2023)   Hunger Vital Sign    Worried About Running Out of Food in the Last Year: Sometimes true    Ran Out of Food in the Last Year: Often true  Transportation Needs: No Transportation Needs (09/12/2023)   PRAPARE - Administrator, Civil Service (Medical): No    Lack of Transportation (Non-Medical): No  Recent Concern: Transportation Needs - Unmet Transportation Needs (08/29/2023)   PRAPARE - Transportation    Lack of Transportation (Medical): Yes    Lack of Transportation (Non-Medical): Yes  Physical Activity: Inactive (09/12/2023)   Exercise Vital Sign    Days of Exercise per Week: 0 days    Minutes of Exercise per Session: 0 min  Stress: No Stress Concern Present (09/12/2023)   [employer] of Occupational Health - Occupational Stress Questionnaire    Feeling of Stress : Only a little  Recent Concern: Stress - Stress Concern Present (08/29/2023)   [employer] of Occupational Health - Occupational Stress Questionnaire    Feeling of Stress : To some extent   Social Connections: Socially Isolated (09/12/2023)   Social Connection and Isolation Panel    Frequency of Communication with Friends and Family: Three times a week    Frequency of Social Gatherings with Friends and Family: Twice a week    Attends Religious Services: Never    Database administrator or Organizations: No    Attends Banker Meetings: Never    Marital Status: Separated  Intimate Partner Violence: Not At Risk (09/12/2023)   Humiliation, Afraid, Rape, and Kick questionnaire    Fear of Current or Ex-Partner: No    Emotionally Abused: No    Physically Abused: No    Sexually Abused: No    Family History:   Family History  Problem Relation Age of Onset   Ovarian cancer Neg Hx    Uterine cancer Neg Hx    Breast cancer Neg Hx      ROS:  Please see the history of present illness.  All other ROS reviewed and negative.     Physical Exam/Data: Vitals:   01/26/24 0622 01/26/24 0645 01/26/24 0745 01/26/24 0757  BP:  (!) 188/107 (!) 199/120   Pulse:  76 71   Resp:  19 15   Temp:    98.1 F (36.7 C)  TempSrc:    Oral  SpO2:  100% 100%   Weight: 108.4 kg     Height: 5' 2 (1.575 m)      No intake or output data in the 24 hours ending 01/26/24 0902    01/26/2024    6:22 AM 09/12/2023    8:38 AM 01/24/2023    2:26 PM  Last 3 Weights  Weight (lbs) 239 lb 239 lb 247 lb 3.2 oz  Weight (kg) 108.41 kg 108.41 kg 112.129 kg     Body mass index is 43.71 kg/m.  General:  Well nourished, well developed, in mild acute distress HEENT: normal Neck: no JVD Vascular: No carotid bruits; Distal pulses 2+ bilaterally Cardiac:  normal S1, S2; RRR; no murmur  Lungs:  clear to auscultation bilaterally, no wheezing, rhonchi or rales  Abd: soft, nontender, no  hepatomegaly  Ext: no edema Musculoskeletal:  No deformities, BUE and BLE strength normal and equal Skin: warm and dry  Neuro:  CNs 2-12 intact, no focal abnormalities noted Psych:  Normal affect   EKG:  The EKG was  personally reviewed and demonstrates:  SR, HR 64, NSST/Twave changes Telemetry:  Telemetry was personally reviewed and demonstrates: Sinus rhythm with 3 runs NSVT, 2 of them < 10 bts, but one run was approx 30 sec long.  Relevant CV Studies: None  Laboratory Data: High Sensitivity Troponin:   Recent Labs  Lab 01/25/24 2352 01/26/24 0227 01/26/24 0452 01/26/24 0630  TROPONINIHS 12 59* 606* 1,570*     Chemistry Recent Labs  Lab 01/25/24 2352  NA 137  K 4.1  CL 111  CO2 16*  GLUCOSE 201*  BUN 15  CREATININE 1.65*  CALCIUM  8.7*  GFRNONAA 38*  ANIONGAP 10    Recent Labs  Lab 01/25/24 2352  PROT 5.3*  ALBUMIN 2.9*  AST 18  ALT 18  ALKPHOS 54  BILITOT 0.6   Lipids No results for input(s): CHOL, TRIG, HDL, LABVLDL, LDLCALC, CHOLHDL in the last 168 hours.  Hematology Recent Labs  Lab 01/25/24 2352  WBC 12.9*  RBC 4.54  HGB 14.6  HCT 44.0  MCV 96.9  MCH 32.2  MCHC 33.2  RDW 13.9  PLT 321   Thyroid No results for input(s): TSH, FREET4 in the last 168 hours.  BNPNo results for input(s): BNP, PROBNP in the last 168 hours.  DDimer No results for input(s): DDIMER in the last 168 hours.  Radiology/Studies:  CT ABDOMEN PELVIS W CONTRAST Result Date: 01/26/2024 CLINICAL DATA:  Abdominal pain. EXAM: CT ABDOMEN AND PELVIS WITH CONTRAST TECHNIQUE: Multidetector CT imaging of the abdomen and pelvis was performed using the standard protocol following bolus administration of intravenous contrast. RADIATION DOSE REDUCTION: This exam was performed according to the departmental dose-optimization program which includes automated exposure control, adjustment of the mA and/or kV according to patient size and/or use of iterative reconstruction technique. CONTRAST:  75mL OMNIPAQUE  IOHEXOL  350 MG/ML SOLN COMPARISON:  July 09, 2022 FINDINGS: Lower chest: No acute abnormality. Hepatobiliary: No focal liver abnormality is seen. No gallstones, gallbladder wall  thickening, or biliary dilatation. Pancreas: Unremarkable. No pancreatic ductal dilatation or surrounding inflammatory changes. Spleen: Normal in size without focal abnormality. Adrenals/Urinary Tract: There is mild, stable diffuse left adrenal gland enlargement. The right adrenal gland is unremarkable. Kidneys are normal, without renal calculi, focal lesion, or hydronephrosis. Bladder is unremarkable. Stomach/Bowel: Stomach is within normal limits. Appendix appears normal. No evidence of bowel wall thickening, distention, or inflammatory changes. Noninflamed diverticula are seen throughout the descending and proximal to mid sigmoid colon. Vascular/Lymphatic: Aortic atherosclerosis. No enlarged abdominal or pelvic lymph nodes. Reproductive: Status post hysterectomy. No adnexal masses. Other: No abdominal wall hernia or abnormality. No abdominopelvic ascites. Musculoskeletal: No acute or significant osseous findings. IMPRESSION: 1. Colonic diverticulosis. 2. Evidence of prior hysterectomy. 3. Aortic atherosclerosis. Electronically Signed   By: Suzen Dials M.D.   On: 01/26/2024 02:05   DG Chest Portable 1 View Result Date: 01/26/2024 CLINICAL DATA:  Chest pain. EXAM: PORTABLE CHEST 1 VIEW COMPARISON:  August 08, 2016 FINDINGS: The cardiac silhouette is borderline in size. Low lung volumes are noted with subsequent crowding of the bronchovascular lung markings. No acute infiltrate, pleural effusion or pneumothorax is identified. The visualized skeletal structures are unremarkable. IMPRESSION: Low lung volumes without acute or active cardiopulmonary disease. Electronically Signed   By: Suzen  Houston M.D.   On: 01/26/2024 00:16     Assessment and Plan: Non-STEMI - Troponin has gone from 12 >> 1570.  (She has been evaluated for chest pain in the past, but has never had an elevated troponin) - She is currently having pain at 4/10, but her blood pressure is very high. - She had a CAT scan last p.m.,  and her creatinine is 1.65, so wish to avoid an emergent cath if possible - Add IV nitroglycerin , give carvedilol  12.5 mg, and follow symptoms. - If her pain does not improve with the improvement in her blood pressure, she will need to go emergently to the Cath Lab - If we can improve her pain, hydrate and give her kidneys a chance to recover, then cath Monday - Check echo - Since she vomited up a previous aspirin  dose, discuss additional aspirin  administration with MD - Continue heparin , and high dose statin, check lipid profile and A1c  2. CKD III - 04/2022, creatinine was 1.30, previously normal. - Creatinine has been elevated since then, but 1.65 is the highest level yet. - Will add continuous IV fluids, she has had a 1 L bolus normal saline - Hold home Diovan  40 mg for now  3.  NSVT - The longest run was approximately 30 seconds. - Starting carvedilol  now, which should be safe, even with her history of cocaine use - K+ was 4.1, add magnesium  to a.m. labs  4.  History of recent cocaine use - She admits to doing cocaine yesterday, by her admission, uses it regularly - Complete cessation encouraged   Risk Assessment/Risk Scores:    TIMI Risk Score for Unstable Angina or Non-ST Elevation MI:   The patient's TIMI risk score is 3, which indicates a 13% risk of all cause mortality, new or recurrent myocardial infarction or need for urgent revascularization in the next 14 days.   For questions or updates, please contact [facility] Please consult [email or web address] for contact info under    Signed, Shona Shad, PA-C  01/26/2024 9:02 AM   Personally seen and examined. Agree with above.  48 year old with cocaine use, hypertensive urgency, EKG with approxi-1 mm ST elevation in the inferior leads, troponin currently increased to 1500 with chest pain now a 3/10 improved with administration of IV nitroglycerin .  Blood pressures are still highly elevated.  I just increased her  IV nitroglycerin .  She received amlodipine  p.o. and carvedilol  p.o.  Appears fairly comfortable on exam.  No rubs.  Regular rate and rhythm.  Will repeat EKG.  Non-ST elevation myocardial infarction - EKG does not quite meet criteria for inferior STEMI however she is at high risk for underlying coronary artery disease.  Main goal especially in the setting of recent cocaine use is to optimize her blood pressure, utilize IV nitroglycerin  as well as calcium  channel blocker, amlodipine ,.  I am comfortable using carvedilol  with alpha blockade despite her recent cocaine use.  If chest pain worsens or becomes more worrisome, may need cardiac catheterization and potential intervention.  Trying to hold off given increased risk with concomitant cocaine use.  Discussed with ER team, nursing. She is at high risk for arrhythmia, further complications.  Has had a prior ruptured subarachnoid cerebral aneurysm with CVA in the past.  CRITICAL CARE Performed by: Oneil Parchment   Total critical care time: 40 minutes  Critical care time was exclusive of separately billable procedures and treating other patients.  Critical care was necessary to treat or prevent imminent or  life-threatening deterioration.  Critical care was time spent personally by me on the following activities: development of treatment plan with patient and/or surrogate as well as nursing, discussions with consultants, evaluation of patient's response to treatment, examination of patient, obtaining history from patient or surrogate, ordering and performing treatments and interventions, ordering and review of laboratory studies, ordering and review of radiographic studies, pulse oximetry and re-evaluation of patient's condition.

## 2024-01-26 NOTE — H&P (Signed)
 History and Physical    Lisa Haley:[id number] DOB: 27-Nov-1975 DOA: 01/25/2024  PCP: Vicci Barnie NOVAK, MD  Patient coming from: chest and back pain  I have personally briefly reviewed patient's old medical records in [facility]  Chief Complaint:   HPI: Lisa Haley is a 48 y.o. female with medical history significant of hypertension, hyperlipidemia, stroke in the setting of ruptured subarachnoid cerebral aneurysm in 2021 with no residual weakness, H. pylori infection in March 2025, arthritis, tobacco, cocaine, marijuana, ethanol  abuse.  Then with complaining of chest pain and upper back pain.  Patient reports that yesterday she used cocaine and suddenly she developed severe burning chest pain and upper back pain.  Pain was 10 out of 10 associated with palpitations, sweating, nausea.  She called 911 and brought to the emergency department.  Sublingual nitro was given by EMS.  Of note: Patient had H. pylori infection back in March 2025 was treated appropriately with amoxicillin , clarithromycin  and PPI which she finished the course as prescribed however reports that her symptoms never resolved.  She is currently not on PPI.  Denies melena, poor appetite, unintentional weight loss.  Lives with boyfriend at home.  Smokes cigarettes every day, uses marijuana and cocaine along with occasional alcohol.  ED Course: Upon arrival to ED: Patient afebrile, pulse 67, RR 21, BP initial 155/98 and trended up to 202/112.  CBC shows leukocytosis of 12.9, H&H 14.6/44.0, PLT 321.  NA 137, K: 4.1, CO2 16, creatinine 1.65, GFR 38, lipase: WNL.  Initial troponin 59 trended up to 606 and then to 1570.  Chest x-ray and CT abdomen pelvis negative for any acute abnormalities.  Cardiology consulted.  Patient started on heparin  drip, morphine  given for chest pain she was given home dose of amlodipine  along with IV hydralazine .  [facility]  hospitalist consulted for admission.  Review of Systems: As per  HPI otherwise negative.    Past Medical History:  Diagnosis Date   Anemia    after hysterectomy   Arthritis    Chronic hypertension    GERD (gastroesophageal reflux disease)    Hypertension    Obesity    Preeclampsia    Stroke (HCC) 2021   SAH, admitted at [facility] 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    Past Surgical History:  Procedure Laterality Date   ABDOMINAL HYSTERECTOMY     in 30s for heavy bleeding, RSO   IR ANGIO INTRA EXTRACRAN SEL INTERNAL CAROTID BILAT MOD SED  07/06/2020   IR ANGIO INTRA EXTRACRAN SEL INTERNAL CAROTID BILAT MOD SED  07/12/2020   IR ANGIO VERTEBRAL SEL VERTEBRAL BILAT MOD SED  07/06/2020   IR ANGIO VERTEBRAL SEL VERTEBRAL BILAT MOD SED  07/12/2020   RADIOLOGY WITH ANESTHESIA N/A 07/06/2020   Procedure: IR WITH ANESTHESIA;  Surgeon: Lanis Pupa, MD;  Location: [facility] OR;  Service: Radiology;  Laterality: N/A;   ROBOTIC ASSISTED BILATERAL SALPINGO OOPHERECTOMY N/A 02/14/2022   Procedure: XI ROBOTIC ASSISTED UNILATERAL SALPINGO OOPHORECTOMY, LYSIS OF ADHESIONS, LEFT URETEROLYSIS;  Surgeon: Viktoria Comer SAUNDERS, MD;  Location: [facility] ORS;  Service: Gynecology;  Laterality: N/A;   TUBAL LIGATION     WRIST SURGERY Left    for carpal tunnel     reports that she has been smoking cigarettes. She has a 5 pack-year smoking history. She has never used smokeless tobacco. She reports current alcohol use. She reports current drug use. Drug: Marijuana.  No Known Allergies  Family History  Problem Relation Age of Onset   Ovarian cancer Neg Hx  Uterine cancer Neg Hx    Breast cancer Neg Hx     Prior to Admission medications   Medication Sig Start Date End Date Taking? Authorizing Provider  acetaminophen -codeine  (TYLENOL  #3) 300-30 MG tablet Take 1 tablet by mouth at bedtime as needed for moderate pain (pain score 4-6). 11/07/23  Yes Magnant, Carlin CROME, PA-C  albuterol  (VENTOLIN  HFA) 108 (90 Base) MCG/ACT inhaler Inhale 2 puffs into the lungs every 6 (six) hours as needed for  wheezing or shortness of breath. 10/09/23  Yes Vicci Barnie NOVAK, MD  amLODipine  (NORVASC ) 10 MG tablet Take 1 tablet (10 mg total) by mouth daily. 11/09/23  Yes Vicci Barnie NOVAK, MD  gabapentin  (NEURONTIN ) 300 MG capsule Take 1 capsule (300 mg total) by mouth at bedtime. 01/24/23  Yes Danton Jon HERO, PA-C  meloxicam  (MOBIC ) 15 MG tablet Take 1 tablet (15 mg total) by mouth daily as needed for pain. 11/07/23  Yes Magnant, Charles L, PA-C  valsartan  (DIOVAN ) 40 MG tablet Take 1 tablet (40 mg total) by mouth daily. 10/09/23  Yes Vicci Barnie NOVAK, MD  cetirizine  (ZYRTEC ) 10 MG tablet Take 1 tablet (10 mg total) by mouth daily. Patient not taking: Reported on 01/26/2024 10/09/23   Vicci Barnie NOVAK, MD  cyclobenzaprine  (FLEXERIL ) 10 MG tablet TAKE 1/2 TABLET (5 MG TOTAL) BY MOUTH 2 (TWO) TIMES DAILY AS NEEDED FOR MUSCLE SPASMS. Patient not taking: Reported on 01/26/2024 11/21/23   Vicci Barnie NOVAK, MD  DULoxetine  (CYMBALTA ) 20 MG capsule Take 1 capsule (20 mg total) by mouth daily. Patient not taking: Reported on 01/26/2024 10/09/23   Vicci Barnie NOVAK, MD  pantoprazole  (PROTONIX ) 40 MG tablet Take 1 tablet (40 mg total) by mouth daily. Patient not taking: Reported on 01/26/2024 09/13/23   McClung, Angela M, PA-C  promethazine -dextromethorphan (PROMETHAZINE -DM) 6.25-15 MG/5ML syrup Take 5 mLs by mouth 4 (four) times daily as needed for cough. Patient not taking: Reported on 01/26/2024 10/08/23   Newlin, Enobong, MD    Physical Exam: Vitals:   01/26/24 0622 01/26/24 0645 01/26/24 0745 01/26/24 0757  BP:  (!) 188/107 (!) 199/120   Pulse:  76 71   Resp:  19 15   Temp:    98.1 F (36.7 C)  TempSrc:    Oral  SpO2:  100% 100%   Weight: 108.4 kg     Height: 5' 2 (1.575 m)       Constitutional: NAD, calm, comfortable, on room air, communicating well Eyes: PERRL, lids and conjunctivae normal ENMT: Mucous membranes are moist. Posterior pharynx clear of any exudate or lesions.Normal dentition.  Neck:  normal, supple, no masses, no thyromegaly Respiratory: clear to auscultation bilaterally, no wheezing, no crackles. Normal respiratory effort. No accessory muscle use.  Cardiovascular: Regular rate and rhythm, no murmurs / rubs / gallops. No extremity edema. 2+ pedal pulses. No carotid bruits.  Abdomen: no tenderness, no masses palpated. No hepatosplenomegaly. Bowel sounds positive.  Musculoskeletal: no clubbing / cyanosis. No joint deformity upper and lower extremities. Good ROM, no contractures. Normal muscle tone.  Skin: no rashes, lesions, ulcers. No induration Neurologic: CN 2-12 grossly intact. Sensation intact, DTR normal. Strength 5/5 in all 4.  Psychiatric: Normal judgment and insight. Alert and oriented x 3. Normal mood.    Labs on Admission: I have personally reviewed following labs and imaging studies  CBC: Recent Labs  Lab 01/25/24 2352  WBC 12.9*  NEUTROABS 9.0*  HGB 14.6  HCT 44.0  MCV 96.9  PLT 321   Basic  Metabolic Panel: Recent Labs  Lab 01/25/24 2352  NA 137  K 4.1  CL 111  CO2 16*  GLUCOSE 201*  BUN 15  CREATININE 1.65*  CALCIUM  8.7*   GFR: Estimated Creatinine Clearance: 48.8 mL/min (A) (by C-G formula based on SCr of 1.65 mg/dL (H)). Liver Function Tests: Recent Labs  Lab 01/25/24 2352  AST 18  ALT 18  ALKPHOS 54  BILITOT 0.6  PROT 5.3*  ALBUMIN 2.9*   Recent Labs  Lab 01/25/24 2352  LIPASE 25   No results for input(s): AMMONIA in the last 168 hours. Coagulation Profile: No results for input(s): INR, PROTIME in the last 168 hours. Cardiac Enzymes: No results for input(s): CKTOTAL, CKMB, CKMBINDEX, TROPONINI in the last 168 hours. BNP (last 3 results) No results for input(s): PROBNP in the last 8760 hours. HbA1C: No results for input(s): HGBA1C in the last 72 hours. CBG: No results for input(s): GLUCAP in the last 168 hours. Lipid Profile: No results for input(s): CHOL, HDL, LDLCALC, TRIG, CHOLHDL,  LDLDIRECT in the last 72 hours. Thyroid Function Tests: No results for input(s): TSH, T4TOTAL, FREET4, T3FREE, THYROIDAB in the last 72 hours. Anemia Panel: No results for input(s): VITAMINB12, FOLATE, FERRITIN, TIBC, IRON, RETICCTPCT in the last 72 hours. Urine analysis:    Component Value Date/Time   COLORURINE AMBER (A) 02/01/2016 2156   APPEARANCEUR CLOUDY (A) 02/01/2016 2156   LABSPEC 1.034 (H) 02/01/2016 2156   PHURINE 6.0 02/01/2016 2156   GLUCOSEU NEGATIVE 02/01/2016 2156   HGBUR NEGATIVE 02/01/2016 2156   BILIRUBINUR small (A) 11/09/2021 1519   KETONESUR negative 11/09/2021 1519   KETONESUR 15 (A) 02/01/2016 2156   PROTEINUR NEGATIVE 02/01/2016 2156   UROBILINOGEN 1.0 11/09/2021 1519   UROBILINOGEN 1.0 03/05/2014 1957   NITRITE Negative 11/09/2021 1519   NITRITE NEGATIVE 02/01/2016 2156   LEUKOCYTESUR Negative 11/09/2021 1519    Radiological Exams on Admission: CT ABDOMEN PELVIS W CONTRAST Result Date: 01/26/2024 CLINICAL DATA:  Abdominal pain. EXAM: CT ABDOMEN AND PELVIS WITH CONTRAST TECHNIQUE: Multidetector CT imaging of the abdomen and pelvis was performed using the standard protocol following bolus administration of intravenous contrast. RADIATION DOSE REDUCTION: This exam was performed according to the departmental dose-optimization program which includes automated exposure control, adjustment of the mA and/or kV according to patient size and/or use of iterative reconstruction technique. CONTRAST:  75mL OMNIPAQUE  IOHEXOL  350 MG/ML SOLN COMPARISON:  July 09, 2022 FINDINGS: Lower chest: No acute abnormality. Hepatobiliary: No focal liver abnormality is seen. No gallstones, gallbladder wall thickening, or biliary dilatation. Pancreas: Unremarkable. No pancreatic ductal dilatation or surrounding inflammatory changes. Spleen: Normal in size without focal abnormality. Adrenals/Urinary Tract: There is mild, stable diffuse left adrenal gland enlargement.  The right adrenal gland is unremarkable. Kidneys are normal, without renal calculi, focal lesion, or hydronephrosis. Bladder is unremarkable. Stomach/Bowel: Stomach is within normal limits. Appendix appears normal. No evidence of bowel wall thickening, distention, or inflammatory changes. Noninflamed diverticula are seen throughout the descending and proximal to mid sigmoid colon. Vascular/Lymphatic: Aortic atherosclerosis. No enlarged abdominal or pelvic lymph nodes. Reproductive: Status post hysterectomy. No adnexal masses. Other: No abdominal wall hernia or abnormality. No abdominopelvic ascites. Musculoskeletal: No acute or significant osseous findings. IMPRESSION: 1. Colonic diverticulosis. 2. Evidence of prior hysterectomy. 3. Aortic atherosclerosis. Electronically Signed   By: Suzen Dials M.D.   On: 01/26/2024 02:05   DG Chest Portable 1 View Result Date: 01/26/2024 CLINICAL DATA:  Chest pain. EXAM: PORTABLE CHEST 1 VIEW COMPARISON:  August 08, 2016 FINDINGS: The cardiac silhouette is borderline in size. Low lung volumes are noted with subsequent crowding of the bronchovascular lung markings. No acute infiltrate, pleural effusion or pneumothorax is identified. The visualized skeletal structures are unremarkable. IMPRESSION: Low lung volumes without acute or active cardiopulmonary disease. Electronically Signed   By: Suzen Dials M.D.   On: 01/26/2024 00:16    EKG: Independently reviewed.  Normal sinus rhythm, nonspecific ST-T wave changes noted.  Assessment/Plan  NSTEMI: -History of cocaine abuse.  Presented with chest pain.  Troponin trended up.  Chest x-ray negative.  Reviewed EKG. - Cardiology on board appreciate help -Started on IV nitro along with carvedilol  12.5 mg-if no help with the chest pain plan is emergent cardiac cath -Check echocardiogram, A1c and lipid panel -Continue heparin , high-dose statin  AKI on CKD stage IIIb: -Creatinine 1.65 on admission. -Will continue  IV fluids.  Avoid nephrotoxic medication.  Hold home Diovan  40 mg for now. -Plan is monitor renal function closely and if it improves then likely cardiac cath on Monday per cardiology  Hypertension emergency: Continue home amlodipine  - Hold on Diovan  due to worsening renal function.  On IV hydralazine  as needed.  Started on Coreg  by cardiology.  Monitor blood pressure closely  NSVT: Noted on telemetry -On Coreg  started by cardiology.  Monitor closely.  Check electrolytes  Polysubstance abuse: -Check UDS.  History of cocaine, alcohol, marijuana and tobacco abuse -Recommend cessation  Leukocytosis: Likely reactive.  Monitor  History of H. pylori infection - March 2025.  Treated appropriately with antibiotics and PPI. - Start PPI  History of stroke Ruptured subarachnoid aneurysm: No residual weakness -Continue aspirin , statin   DVT prophylaxis: Full dose heparin  Code Status: Full code Family Communication: None present at bedside.  Plan of care discussed with patient in length and she verbalized understanding and agreed with it. Disposition Plan: To be determined Consults called: Cardiology Admission status: Inpatient   Velna JONELLE Skeeter MD [facility]  If 7PM-7AM, please contact night-coverage [email or web address]  01/26/2024, 8:58 AM

## 2024-01-27 ENCOUNTER — Other Ambulatory Visit: Payer: Self-pay

## 2024-01-27 ENCOUNTER — Encounter (HOSPITAL_COMMUNITY): Payer: Self-pay | Admitting: Internal Medicine

## 2024-01-27 DIAGNOSIS — I214 Non-ST elevation (NSTEMI) myocardial infarction: Secondary | ICD-10-CM | POA: Diagnosis not present

## 2024-01-27 LAB — RAPID URINE DRUG SCREEN, HOSP PERFORMED
Amphetamines: NOT DETECTED
Barbiturates: NOT DETECTED
Benzodiazepines: NOT DETECTED
Cocaine: POSITIVE — AB
Opiates: POSITIVE — AB
Tetrahydrocannabinol: POSITIVE — AB

## 2024-01-27 LAB — BASIC METABOLIC PANEL WITH GFR
Anion gap: 11 (ref 5–15)
BUN: 12 mg/dL (ref 6–20)
CO2: 18 mmol/L — ABNORMAL LOW (ref 22–32)
Calcium: 8.5 mg/dL — ABNORMAL LOW (ref 8.9–10.3)
Chloride: 111 mmol/L (ref 98–111)
Creatinine, Ser: 1.25 mg/dL — ABNORMAL HIGH (ref 0.44–1.00)
GFR, Estimated: 53 mL/min — ABNORMAL LOW (ref >60)
Glucose, Bld: 112 mg/dL — ABNORMAL HIGH (ref 70–99)
Potassium: 4.3 mmol/L (ref 3.5–5.1)
Sodium: 140 mmol/L (ref 135–145)

## 2024-01-27 LAB — HEPARIN LEVEL (UNFRACTIONATED)
Heparin Unfractionated: 0.52 [IU]/mL (ref 0.30–0.70)
Heparin Unfractionated: 0.59 [IU]/mL (ref 0.30–0.70)

## 2024-01-27 LAB — CBC
HCT: 41.7 % (ref 36.0–46.0)
Hemoglobin: 14 g/dL (ref 12.0–15.0)
MCH: 32 pg (ref 26.0–34.0)
MCHC: 33.6 g/dL (ref 30.0–36.0)
MCV: 95.2 fL (ref 80.0–100.0)
Platelets: 243 K/uL (ref 150–400)
RBC: 4.38 MIL/uL (ref 3.87–5.11)
RDW: 14.1 % (ref 11.5–15.5)
WBC: 9.6 K/uL (ref 4.0–10.5)
nRBC: 0 % (ref 0.0–0.2)

## 2024-01-27 LAB — LIPID PANEL
Cholesterol: 184 mg/dL (ref 0–200)
HDL: 48 mg/dL (ref >40)
LDL Cholesterol: 116 mg/dL — ABNORMAL HIGH (ref 0–99)
Total CHOL/HDL Ratio: 3.8 ratio
Triglycerides: 102 mg/dL (ref <150)
VLDL: 20 mg/dL (ref 0–40)

## 2024-01-27 MED ORDER — GUAIFENESIN 100 MG/5ML PO LIQD: 5.0000 mL | ORAL | Status: DC | PRN

## 2024-01-27 MED ORDER — ASPIRIN 81 MG PO CHEW
81.0000 mg | CHEWABLE_TABLET | ORAL | Status: AC
  Administered 2024-01-28: 81 mg via ORAL
  Filled 2024-01-27: qty 1

## 2024-01-27 MED ORDER — SODIUM CHLORIDE 0.9 % WEIGHT BASED INFUSION
1.0000 mL/kg/h | INTRAVENOUS | Status: DC
  Administered 2024-01-27: 1 mL/kg/h via INTRAVENOUS

## 2024-01-27 MED ORDER — IRBESARTAN 150 MG PO TABS
150.0000 mg | ORAL_TABLET | Freq: Every day | ORAL | Status: DC
  Administered 2024-01-27 – 2024-01-29 (×3): 150 mg via ORAL
  Filled 2024-01-27 (×3): qty 1

## 2024-01-27 MED ORDER — SENNOSIDES-DOCUSATE SODIUM 8.6-50 MG PO TABS: 1.0000 | ORAL_TABLET | Freq: Every evening | ORAL | Status: DC | PRN

## 2024-01-27 MED ORDER — ISOSORBIDE MONONITRATE ER 30 MG PO TB24
30.0000 mg | ORAL_TABLET | Freq: Every day | ORAL | Status: DC
  Administered 2024-01-27: 30 mg via ORAL
  Filled 2024-01-27 (×2): qty 1

## 2024-01-27 MED ORDER — CARVEDILOL 25 MG PO TABS
25.0000 mg | ORAL_TABLET | Freq: Two times a day (BID) | ORAL | Status: DC
  Administered 2024-01-27 – 2024-01-29 (×4): 25 mg via ORAL
  Filled 2024-01-27 (×4): qty 1

## 2024-01-27 MED ORDER — HYDRALAZINE HCL 20 MG/ML IJ SOLN: 10.0000 mg | INTRAMUSCULAR | Status: DC | PRN

## 2024-01-27 MED ORDER — OXYCODONE HCL 5 MG PO TABS
5.0000 mg | ORAL_TABLET | ORAL | Status: DC | PRN
  Administered 2024-01-27 – 2024-01-29 (×6): 5 mg via ORAL
  Filled 2024-01-27 (×7): qty 1

## 2024-01-27 MED ORDER — LORATADINE 10 MG PO TABS: 10.0000 mg | ORAL_TABLET | Freq: Every day | ORAL | Status: DC | PRN

## 2024-01-27 MED ORDER — CLOPIDOGREL BISULFATE 75 MG PO TABS: 75.0000 mg | ORAL_TABLET | Freq: Every day | ORAL | Status: DC

## 2024-01-27 MED ORDER — IPRATROPIUM-ALBUTEROL 0.5-2.5 (3) MG/3ML IN SOLN: 3.0000 mL | RESPIRATORY_TRACT | Status: DC | PRN

## 2024-01-27 NOTE — Progress Notes (Signed)
 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 pylori in 2025, arthritis, cocaine use, marijuana use, alcohol use presented with the complaints of chest pain and back pain.  Upon admission she was noted to have NSTEMI with significant rise in her troponin, cardiology was consulted she was started on heparin  drip.   Assessment & Plan:  Principal Problem:   NSTEMI (non-ST elevated myocardial infarction) (HCC) Active Problems:   Essential hypertension   Tobacco dependence   Class 3 severe obesity due to excess calories with serious comorbidity and body mass index (BMI) of 45.0 to 49.9 in adult      NSTEMI: Likely in the setting of cocaine use.  Significant rise in troponin.  Currently on heparin  drip Echocardiogram preserved EF but there is signs of apical ballooning concerning for Takotsubo. Left heart catheterization planned on Monday depending on her renal function Continue IV fluids LDL 116, A1c 5.3   AKI on CKD stage IIIb: Resolved -Creatinine 1.65 on admission.  Prior baseline 1.3.  Creatinine today 1.25 -Holding home Diovan     Hypertension emergency: Continue home amlodipine  - For now started on Coreg .  IV as needed   NSVT: Noted on telemetry - Monitor and replete electrolytes.  Currently on Coreg    Polysubstance abuse: -Check UDS.  History of cocaine, alcohol, marijuana and tobacco abuse -Recommend cessation   Leukocytosis: Likely reactive.  Monitor   History of H. pylori infection - March 2025.  Treated appropriately with antibiotics and PPI. - Now on PPI   History of stroke Ruptured subarachnoid aneurysm: No residual weakness -Continue aspirin , statin     DVT prophylaxis: Full dose heparin  Code Status: Full code Family  Communication:   Status is: Inpatient Continue hospital stay for left heart catheterization likely on Monday    Subjective: Seen at bedside reporting of some headache while being on nitroglycerin  drip Agreeable for left heart cath tomorrow   Examination:  General exam: Appears calm and comfortable  Respiratory system: Clear to auscultation. Respiratory effort normal. Cardiovascular system: S1 & S2 heard, RRR. No JVD, murmurs, rubs, gallops or clicks. No pedal edema. Gastrointestinal system: Abdomen is nondistended, soft and nontender. No organomegaly or masses felt. Normal bowel sounds heard. Central nervous system: Alert and oriented. No focal neurological deficits. Extremities: Symmetric 5 x 5 power. Skin: No rashes, lesions or ulcers Psychiatry: Judgement and insight appear normal. Mood & affect appropriate.                Diet Orders (From admission, onward)     Start     Ordered   01/26/24 1359  Diet Heart Room service appropriate? Yes; Fluid consistency: Thin  Diet effective now       Question Answer Comment  Room service appropriate? Yes   Fluid consistency: Thin      01/26/24 1358            Objective: Vitals:   01/26/24 1946 01/26/24 2338 01/27/24 0420 01/27/24 0732  BP: (!) 172/102 (!) 158/101 (!) 171/106 (!) 145/75  Pulse: 73 86 67 76  Resp: 16 18 16 19   Temp: 97.8 F (36.6 C) 98.6 F (37 C) 98.6 F (37 C) 97.8 F (36.6 C)  TempSrc: Oral Oral Oral Oral  SpO2: 97% 96% 95%   Weight:  Height:        Intake/Output Summary (Last 24 hours) at 01/27/2024 1049 Last data filed at 01/26/2024 1620 Gross per 24 hour  Intake 352.67 ml  Output --  Net 352.67 ml   Filed Weights   01/26/24 0622  Weight: 108.4 kg    Scheduled Meds:  amLODipine   10 mg Oral Daily   aspirin  EC  81 mg Oral Daily   atorvastatin   80 mg Oral Daily   carvedilol   25 mg Oral BID WC   Chlorhexidine  Gluconate Cloth  6 each Topical Daily   irbesartan   150 mg Oral  Daily   isosorbide  mononitrate  30 mg Oral Daily   mupirocin  ointment  1 Application Nasal BID   pantoprazole   40 mg Oral BID   Continuous Infusions:  heparin  1,150 Units/hr (01/27/24 0257)   nitroGLYCERIN  35 mcg/min (01/27/24 0919)    Nutritional status     Body mass index is 43.71 kg/m.  Data Reviewed:   CBC: Recent Labs  Lab 01/25/24 2352 01/27/24 0242  WBC 12.9* 9.6  NEUTROABS 9.0*  --   HGB 14.6 14.0  HCT 44.0 41.7  MCV 96.9 95.2  PLT 321 243   Basic Metabolic Panel: Recent Labs  Lab 01/25/24 2352 01/26/24 1420 01/27/24 0217  NA 137  --  140  K 4.1  --  4.3  CL 111  --  111  CO2 16*  --  18*  GLUCOSE 201*  --  112*  BUN 15  --  12  CREATININE 1.65*  --  1.25*  CALCIUM  8.7*  --  8.5*  MG  --  2.3  --    GFR: Estimated Creatinine Clearance: 64.5 mL/min (A) (by C-G formula based on SCr of 1.25 mg/dL (H)). Liver Function Tests: Recent Labs  Lab 01/25/24 2352  AST 18  ALT 18  ALKPHOS 54  BILITOT 0.6  PROT 5.3*  ALBUMIN 2.9*   Recent Labs  Lab 01/25/24 2352  LIPASE 25   No results for input(s): AMMONIA in the last 168 hours. Coagulation Profile: No results for input(s): INR, PROTIME in the last 168 hours. Cardiac Enzymes: No results for input(s): CKTOTAL, CKMB, CKMBINDEX, TROPONINI in the last 168 hours. BNP (last 3 results) No results for input(s): PROBNP in the last 8760 hours. HbA1C: Recent Labs    01/26/24 0914  HGBA1C 5.3   CBG: No results for input(s): GLUCAP in the last 168 hours. Lipid Profile: Recent Labs    01/27/24 0242  CHOL 184  HDL 48  LDLCALC 116*  TRIG 102  CHOLHDL 3.8   Thyroid Function Tests: Recent Labs    01/26/24 0856  TSH 0.927   Anemia Panel: No results for input(s): VITAMINB12, FOLATE, FERRITIN, TIBC, IRON, RETICCTPCT in the last 72 hours. Sepsis Labs: No results for input(s): PROCALCITON, LATICACIDVEN in the last 168 hours.  Recent Results (from the past 240  hours)  MRSA Next Gen by PCR, Nasal     Status: Abnormal   Collection Time: 01/26/24 11:00 AM   Specimen: Nasal Mucosa; Nasal Swab  Result Value Ref Range Status   MRSA by PCR Next Gen DETECTED (A) NOT DETECTED Final    Comment: RESULT CALLED TO, READ BACK BY AND VERIFIED WITH: RN IZETTA EDISON [phone] AT 1440, ADC (NOTE) The GeneXpert MRSA Assay (FDA approved for NASAL specimens only), is one component of a comprehensive MRSA colonization surveillance program. It is not intended to diagnose MRSA infection nor to guide or monitor treatment for  MRSA infections. Test performance is not FDA approved in patients less than 64 years old. Performed at [facility], [address], [place], [place] [address]          Radiology Studies: ECHOCARDIOGRAM COMPLETE Result Date: 01/26/2024    ECHOCARDIOGRAM REPORT   Patient Name:   Lisa Haley Date of Exam: 01/26/2024 Medical Rec #:  [id number]           Height:       62.0 in Accession #:    [id number]          Weight:       239.0 lb Date of Birth:  05/07/1976            BSA:          2.062 m Patient Age:    48 years            BP:           197/128 mmHg Patient Gender: F                   HR:           82 bpm. Exam Location:  Inpatient Procedure: 2D Echo, Cardiac Doppler, Color Doppler and Strain Analysis (Both            Spectral and Color Flow Doppler were utilized during procedure). Indications:    Chest Pain R07.9  History:        Patient has no prior history of Echocardiogram examinations.                 Previous Myocardial Infarction, Migraine and Stroke,                 Signs/Symptoms:Shortness of Breath; Risk Factors:Hypertension                 and Current Smoker.  Sonographer:    Thea Norlander RCS Referring Phys: [id number] VELNA SAUNDERS PAHWANI IMPRESSIONS  1. There is no evidence of left ventricular apical thrombus. There is a limited area of severe hypokinesis and subtle dyskinesis of the apex, generally suggesting a pattern of apical  ballooning. Left ventricular ejection fraction, by estimation, is 50 to 55%. The left ventricle has low normal function. The left ventricle demonstrates regional wall motion abnormalities (see scoring diagram/findings for description). There is mild concentric left ventricular hypertrophy. Left ventricular diastolic parameters are consistent with Grade I diastolic dysfunction (impaired relaxation). There is severe hypokinesis of the left ventricular, entire apical segment.  2. Right ventricular systolic function is normal. The right ventricular size is normal. Tricuspid regurgitation signal is inadequate for assessing PA pressure.  3. The mitral valve is normal in structure. No evidence of mitral valve regurgitation. No evidence of mitral stenosis.  4. The aortic valve is tricuspid. Aortic valve regurgitation is not visualized. No aortic stenosis is present.  5. The inferior vena cava is normal in size with greater than 50% respiratory variability, suggesting right atrial pressure of 3 mmHg. Comparison(s): No prior Echocardiogram. Findings suggest possible takotsubo syndrome, but cannot exclude distal LAD artery distribution ischemia/infarction. FINDINGS  Left Ventricle: There is no evidence of left ventricular apical thrombus. There is a limited area of severe hypokinesis and subtle dyskinesis of the apex, generally suggesting a pattern of apical ballooning. Left ventricular ejection fraction, by estimation, is 50 to 55%. The left ventricle has low normal function. The left ventricle demonstrates regional wall motion abnormalities. Severe hypokinesis of the left ventricular, entire apical  segment. Global longitudinal strain performed but not reported based on interpreter judgement due to suboptimal tracking. The left ventricular internal cavity size was normal in size. There is mild concentric left ventricular hypertrophy. Left ventricular diastolic parameters are consistent with Grade I diastolic dysfunction  (impaired relaxation).  LV Wall Scoring: The apical lateral segment, apical anterior segment, and apex are dyskinetic. Right Ventricle: The right ventricular size is normal. No increase in right ventricular wall thickness. Right ventricular systolic function is normal. Tricuspid regurgitation signal is inadequate for assessing PA pressure. Left Atrium: Left atrial size was normal in size. Right Atrium: Right atrial size was normal in size. Pericardium: There is no evidence of pericardial effusion. Mitral Valve: The mitral valve is normal in structure. No evidence of mitral valve regurgitation. No evidence of mitral valve stenosis. Tricuspid Valve: The tricuspid valve is normal in structure. Tricuspid valve regurgitation is not demonstrated. No evidence of tricuspid stenosis. Aortic Valve: The aortic valve is tricuspid. Aortic valve regurgitation is not visualized. No aortic stenosis is present. Aortic valve peak gradient measures 5.1 mmHg. Pulmonic Valve: The pulmonic valve was grossly normal. Pulmonic valve regurgitation is not visualized. No evidence of pulmonic stenosis. Aorta: The aortic root and ascending aorta are structurally normal, with no evidence of dilitation. Venous: The inferior vena cava is normal in size with greater than 50% respiratory variability, suggesting right atrial pressure of 3 mmHg. IAS/Shunts: No atrial level shunt detected by color flow Doppler.  LEFT VENTRICLE PLAX 2D LVIDd:         4.20 cm   Diastology LVIDs:         2.90 cm   LV e' medial:    5.11 cm/s LV PW:         1.20 cm   LV E/e' medial:  9.5 LV IVS:        1.35 cm   LV e' lateral:   3.32 cm/s LVOT diam:     2.10 cm   LV E/e' lateral: 14.7 LV SV:         54 LV SV Index:   26 LVOT Area:     3.46 cm  RIGHT VENTRICLE             IVC RV S prime:     11.90 cm/s  IVC diam: 1.00 cm TAPSE (M-mode): 2.0 cm LEFT ATRIUM             Index        RIGHT ATRIUM          Index LA diam:        3.20 cm 1.55 cm/m   RA Area:     9.90 cm LA Vol  (A2C):   24.3 ml 11.78 ml/m  RA Volume:   19.20 ml 9.31 ml/m LA Vol (A4C):   26.1 ml 12.66 ml/m LA Biplane Vol: 26.6 ml 12.90 ml/m  AORTIC VALVE AV Area (Vmax): 2.55 cm AV Vmax:        113.00 cm/s AV Peak Grad:   5.1 mmHg LVOT Vmax:      83.30 cm/s LVOT Vmean:     54.000 cm/s LVOT VTI:       0.155 m  AORTA Ao Root diam: 3.20 cm Ao Asc diam:  3.00 cm MITRAL VALVE MV Area (PHT): 4.80 cm    SHUNTS MV Decel Time: 158 msec    Systemic VTI:  0.16 m MV E velocity: 48.70 cm/s  Systemic Diam: 2.10 cm MV A velocity: 84.40 cm/s MV  E/A ratio:  0.58 Mihai Croitoru MD Electronically signed by Jerel Balding MD Signature Date/Time: 01/26/2024/12:55:39 PM    Final    CT ABDOMEN PELVIS W CONTRAST Result Date: 01/26/2024 CLINICAL DATA:  Abdominal pain. EXAM: CT ABDOMEN AND PELVIS WITH CONTRAST TECHNIQUE: Multidetector CT imaging of the abdomen and pelvis was performed using the standard protocol following bolus administration of intravenous contrast. RADIATION DOSE REDUCTION: This exam was performed according to the departmental dose-optimization program which includes automated exposure control, adjustment of the mA and/or kV according to patient size and/or use of iterative reconstruction technique. CONTRAST:  75mL OMNIPAQUE  IOHEXOL  350 MG/ML SOLN COMPARISON:  July 09, 2022 FINDINGS: Lower chest: No acute abnormality. Hepatobiliary: No focal liver abnormality is seen. No gallstones, gallbladder wall thickening, or biliary dilatation. Pancreas: Unremarkable. No pancreatic ductal dilatation or surrounding inflammatory changes. Spleen: Normal in size without focal abnormality. Adrenals/Urinary Tract: There is mild, stable diffuse left adrenal gland enlargement. The right adrenal gland is unremarkable. Kidneys are normal, without renal calculi, focal lesion, or hydronephrosis. Bladder is unremarkable. Stomach/Bowel: Stomach is within normal limits. Appendix appears normal. No evidence of bowel wall thickening, distention,  or inflammatory changes. Noninflamed diverticula are seen throughout the descending and proximal to mid sigmoid colon. Vascular/Lymphatic: Aortic atherosclerosis. No enlarged abdominal or pelvic lymph nodes. Reproductive: Status post hysterectomy. No adnexal masses. Other: No abdominal wall hernia or abnormality. No abdominopelvic ascites. Musculoskeletal: No acute or significant osseous findings. IMPRESSION: 1. Colonic diverticulosis. 2. Evidence of prior hysterectomy. 3. Aortic atherosclerosis. Electronically Signed   By: Suzen Dials M.D.   On: 01/26/2024 02:05   DG Chest Portable 1 View Result Date: 01/26/2024 CLINICAL DATA:  Chest pain. EXAM: PORTABLE CHEST 1 VIEW COMPARISON:  August 08, 2016 FINDINGS: The cardiac silhouette is borderline in size. Low lung volumes are noted with subsequent crowding of the bronchovascular lung markings. No acute infiltrate, pleural effusion or pneumothorax is identified. The visualized skeletal structures are unremarkable. IMPRESSION: Low lung volumes without acute or active cardiopulmonary disease. Electronically Signed   By: Suzen Dials M.D.   On: 01/26/2024 00:16           LOS: 1 day   Time spent= 35 mins    Jenaro Souder JAYSON Dare, MD [facility]  If 7PM-7AM, please contact night-coverage  01/27/2024, 10:49 AM

## 2024-01-27 NOTE — Progress Notes (Signed)
 PHARMACY - ANTICOAGULATION CONSULT NOTE  Pharmacy Consult for heparin  Indication: NSTEMI  Labs: Recent Labs    01/25/24 2352 01/26/24 0227 01/26/24 0452 01/26/24 0630 01/26/24 1420 01/27/24 0217 01/27/24 0242  HGB 14.6  --   --   --   --   --  14.0  HCT 44.0  --   --   --   --   --  41.7  PLT 321  --   --   --   --   --  243  HEPARINUNFRC  --   --   --   --  0.27*  --  0.52  CREATININE 1.65*  --   --   --   --  1.25*  --   TROPONINIHS 12 59* 606* 1,570*  --   --   --    Assessment/Plan:  48yo female therapeutic on heparin  after rate change. Will continue infusion at current rate of 1150 units/hr and confirm stable with additional level.  Marvetta Dauphin, PharmD, BCPS 01/27/2024 3:36 AM

## 2024-01-27 NOTE — Hospital Course (Addendum)
 Brief Narrative:  48 year old female with history of HTN, HLD, stroke in the setting of ruptured subarachnoid cerebral aneurysm 2021 no residual weakness, H. pylori in 2025, arthritis, cocaine use, marijuana use, alcohol use presented with the complaints of chest pain and back pain. Initially started on heparin  drip due to significant rise in troponin.  This was likely cocaine induced.  LHC on 7/21 showed sluggish flow but no obvious stenosis.  Cardiology recommending DAPT for 1 year minimum and medical management.  Counseled to quit using cocaine LDL 116, A1c 5.3  Medications adjusted per cardiology team.  Will discharge her in stable condition.   Assessment & Plan:  Principal Problem:   NSTEMI (non-ST elevated myocardial infarction) (HCC) Active Problems:   Essential hypertension   Tobacco dependence   Class 3 severe obesity due to excess calories with serious comorbidity and body mass index (BMI) of 45.0 to 49.9 in adult      NSTEMI: Initially started on heparin  drip due to significant rise in troponin.  This was likely cocaine induced.  LHC on 7/21 showed sluggish flow but no obvious stenosis.  Cardiology recommending DAPT for 1 year minimum and medical management.  Counseled to quit using cocaine LDL 116, A1c 5.3 Medications per cardiology team.   AKI on CKD stage IIIb: Resolved -Creatinine 1.65 on admission.  Prior baseline 1.3.  Creatinine today 1.16.   -Holding home Diovan     Hypertension emergency:  On Coreg , Imdur , Norvasc , Avapro .  Discontinue nitroglycerin  drip    NSVT: Noted on telemetry - Monitor and replete electrolytes.  Currently on Coreg    Polysubstance abuse: Consult to quit using illicit drugs.  UDS positive for cocaine   Leukocytosis: Likely reactive.  Monitor   History of H. pylori infection - March 2025.  Treated appropriately with antibiotics and PPI. - Now on PPI   History of stroke Ruptured subarachnoid aneurysm: No residual weakness -Continue  aspirin , statin     DVT prophylaxis: Full dose heparin  Code Status: Full code Family Communication:   Hopefully discharge once cleared by cardiology   Subjective:  Feeling okay no complaints.  Denying any chest pain.  Examination:  General exam: Appears calm and comfortable  Respiratory system: Clear to auscultation. Respiratory effort normal. Cardiovascular system: S1 & S2 heard, RRR. No JVD, murmurs, rubs, gallops or clicks. No pedal edema. Gastrointestinal system: Abdomen is nondistended, soft and nontender. No organomegaly or masses felt. Normal bowel sounds heard. Central nervous system: Alert and oriented. No focal neurological deficits. Extremities: Symmetric 5 x 5 power. Skin: No rashes, lesions or ulcers Psychiatry: Judgement and insight appear normal. Mood & affect appropriate.

## 2024-01-27 NOTE — Progress Notes (Signed)
 PHARMACY - ANTICOAGULATION CONSULT NOTE  Pharmacy Consult for heparin  Indication: chest pain/ACS  No Known Allergies  Patient Measurements: Height: 5' 2 (157.5 cm) Weight: 108.4 kg (239 lb) IBW/kg (Calculated) : 50.1 HEPARIN  DW (KG): 76.4  Vital Signs: Temp: 97.8 F (36.6 C) (07/20 0732) Temp Source: Oral (07/20 0732) BP: 145/75 (07/20 0732) Pulse Rate: 76 (07/20 0732)  Labs: Recent Labs    01/25/24 2352 01/26/24 0227 01/26/24 0452 01/26/24 0630 01/26/24 1420 01/27/24 0217 01/27/24 0242 01/27/24 1029  HGB 14.6  --   --   --   --   --  14.0  --   HCT 44.0  --   --   --   --   --  41.7  --   PLT 321  --   --   --   --   --  243  --   HEPARINUNFRC  --   --   --   --  0.27*  --  0.52 0.59  CREATININE 1.65*  --   --   --   --  1.25*  --   --   TROPONINIHS 12 59* 606* 1,570*  --   --   --   --     Estimated Creatinine Clearance: 64.5 mL/min (A) (by C-G formula based on SCr of 1.25 mg/dL (H)).   Medical History: Past Medical History:  Diagnosis Date   Anemia    after hysterectomy   Arthritis    Chronic hypertension    GERD (gastroesophageal reflux disease)    Hypertension    Obesity    Preeclampsia    Stroke (HCC) 2021   SAH, admitted at [facility] 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    Assessment: 48yo female c/o central CP associated w/ pain across upper back and abdomen as well as N/V, initial troponin negative but now rising (12>59>606) >> to begin heparin .  7/20 1200: Heparin  level 0.59, therapeutic on heparin  1150 units/hr. No issues with infusion running or signs of bleeding per RN. CBC stable (Hgb 14.0, PLT 243).  Goal of Therapy:  Heparin  level 0.3-0.7 units/ml Monitor platelets by anticoagulation protocol: Yes   Plan:  Continue heparin  to 1150 units/hr Monitor heparin  level and CBC daily F/u plans for [facility]  Morna Breach, PharmD PGY2 Cardiology Pharmacy Resident 01/27/2024 11:52 AM

## 2024-01-27 NOTE — Plan of Care (Signed)
  Problem: Education: Goal: Understanding of cardiac disease, CV risk reduction, and recovery process will improve Outcome: Progressing   Problem: Cardiac: Goal: Ability to achieve and maintain adequate cardiovascular perfusion will improve Outcome: Progressing   Problem: Education: Goal: Knowledge of General Education information will improve Description: Including pain rating scale, medication(s)/side effects and non-pharmacologic comfort measures Outcome: Progressing   Problem: Clinical Measurements: Goal: Ability to maintain clinical measurements within normal limits will improve Outcome: Progressing Goal: Will remain free from infection Outcome: Progressing

## 2024-01-27 NOTE — Plan of Care (Signed)
°  Problem: Education: °Goal: Understanding of cardiac disease, CV risk reduction, and recovery process will improve °Outcome: Progressing °Goal: Individualized Educational Video(s) °Outcome: Progressing °  °

## 2024-01-27 NOTE — Progress Notes (Signed)
  Progress Note  Patient Name: Lisa Haley Date of Encounter: 01/27/2024 [facility] Cardiologist: Oneil Parchment, MD   Interval Summary   Laying comfortably in bed, sleeping easily arousable, significant other at bedside as well. No chest pain.  Discussed cardiac catheterization.  Vital Signs Vitals:   01/26/24 1946 01/26/24 2338 01/27/24 0420 01/27/24 0732  BP: (!) 172/102 (!) 158/101 (!) 171/106 (!) 145/75  Pulse: 73 86 67 76  Resp: 16 18 16 19   Temp: 97.8 F (36.6 C) 98.6 F (37 C) 98.6 F (37 C) 97.8 F (36.6 C)  TempSrc: Oral Oral Oral Oral  SpO2: 97% 96% 95%   Weight:      Height:        Intake/Output Summary (Last 24 hours) at 01/27/2024 9177 Last data filed at 01/26/2024 1620 Gross per 24 hour  Intake 352.67 ml  Output --  Net 352.67 ml      01/26/2024    6:22 AM 09/12/2023    8:38 AM 01/24/2023    2:26 PM  Last 3 Weights  Weight (lbs) 239 lb 239 lb 247 lb 3.2 oz  Weight (kg) 108.41 kg 108.41 kg 112.129 kg      Telemetry/ECG  No adverse arrhythmias, sinus rhythm- Personally Reviewed  Physical Exam  GEN: No acute distress.   Neck: No JVD Cardiac: RRR, no murmurs, rubs, or gallops.  Respiratory: Clear to auscultation bilaterally. GI: Soft, nontender, non-distended  MS: No edema  Assessment & Plan   48 year old with cocaine induced non-ST elevation myocardial infarction, hypertensive urgency  Non-STEMI - On IV nitroglycerin , IV heparin , troponin increased from 59 up to 606 up to 1570.  Continue IV heparin  for another 24 hours. -LDL 116, optimally less than 55 at goal.  On atorvastatin  80 mg high intensity statin dose -Aspirin  81 mg.  We will hold off on adding clopidogrel  in anticipation of cardiac catheterization tomorrow. -Ejection fraction 50-55% with apical akinesis, no evidence of thrombus. -Currently chest pain-free.  Will add isosorbide  30 mg a day and try to wean off nitroglycerin  IV -Since creatinine has improved down to 1.25  from 1.6, it would not be unreasonable to perform diagnostic cardiac catheterization tomorrow.  Risk and benefits were discussed including stroke heart attack death further renal impairment.  She is willing to proceed. -Offer cardiac rehabilitation   Hypertensive urgency - Blood pressures improved -Amlodipine  10 mg a day, carvedilol  12.5 mg twice a day (will increase to 25 twice a day) -Creatinine down to 1.25 from 1.6 on admission, will add irbesartan  150 mg a day, ARB given her wall motion abnormality on echocardiogram.  Cocaine use - Strongly encourage cessation of cocaine.  At high risk for future morbidity and mortality.  Prior history of stroke - Would not utilize Effient if necessary.      For questions or updates, please contact [facility] Please consult [email or web address] for contact info under       Signed, Oneil Parchment, MD

## 2024-01-28 ENCOUNTER — Encounter (HOSPITAL_COMMUNITY)
Admission: EM | Disposition: A | Discharge status: Home / Self Care | Payer: Self-pay | Source: Home / Self Care | Attending: Internal Medicine

## 2024-01-28 ENCOUNTER — Encounter (HOSPITAL_COMMUNITY): Payer: Self-pay | Admitting: Cardiology

## 2024-01-28 DIAGNOSIS — I160 Hypertensive urgency: Secondary | ICD-10-CM

## 2024-01-28 DIAGNOSIS — F14988 Cocaine use, unspecified with other cocaine-induced disorder: Secondary | ICD-10-CM

## 2024-01-28 DIAGNOSIS — N1831 Chronic kidney disease, stage 3a: Secondary | ICD-10-CM

## 2024-01-28 DIAGNOSIS — I214 Non-ST elevation (NSTEMI) myocardial infarction: Secondary | ICD-10-CM | POA: Diagnosis not present

## 2024-01-28 HISTORY — PX: LEFT HEART CATH AND CORONARY ANGIOGRAPHY: CATH118249

## 2024-01-28 LAB — CBC
HCT: 36.7 % (ref 36.0–46.0)
Hemoglobin: 12.1 g/dL (ref 12.0–15.0)
MCH: 31.3 pg (ref 26.0–34.0)
MCHC: 33 g/dL (ref 30.0–36.0)
MCV: 94.8 fL (ref 80.0–100.0)
Platelets: 224 K/uL (ref 150–400)
RBC: 3.87 MIL/uL (ref 3.87–5.11)
RDW: 14 % (ref 11.5–15.5)
WBC: 9.6 K/uL (ref 4.0–10.5)
nRBC: 0 % (ref 0.0–0.2)

## 2024-01-28 LAB — HEPARIN LEVEL (UNFRACTIONATED): Heparin Unfractionated: 0.67 [IU]/mL (ref 0.30–0.70)

## 2024-01-28 LAB — BASIC METABOLIC PANEL WITH GFR
Anion gap: 8 (ref 5–15)
BUN: 20 mg/dL (ref 6–20)
CO2: 21 mmol/L — ABNORMAL LOW (ref 22–32)
Calcium: 8.3 mg/dL — ABNORMAL LOW (ref 8.9–10.3)
Chloride: 109 mmol/L (ref 98–111)
Creatinine, Ser: 1.39 mg/dL — ABNORMAL HIGH (ref 0.44–1.00)
GFR, Estimated: 47 mL/min — ABNORMAL LOW (ref >60)
Glucose, Bld: 101 mg/dL — ABNORMAL HIGH (ref 70–99)
Potassium: 4.2 mmol/L (ref 3.5–5.1)
Sodium: 138 mmol/L (ref 135–145)

## 2024-01-28 LAB — MAGNESIUM: Magnesium: 2.1 mg/dL (ref 1.7–2.4)

## 2024-01-28 SURGERY — LEFT HEART CATH AND CORONARY ANGIOGRAPHY
Anesthesia: LOCAL

## 2024-01-28 MED ORDER — LIDOCAINE HCL (PF) 1 % IJ SOLN
INTRAMUSCULAR | Status: AC
  Filled 2024-01-28: qty 30

## 2024-01-28 MED ORDER — HEPARIN SODIUM (PORCINE) 1000 UNIT/ML IJ SOLN
INTRAMUSCULAR | Status: DC | PRN
  Administered 2024-01-28: 5500 [IU] via INTRAVENOUS

## 2024-01-28 MED ORDER — ISOSORBIDE MONONITRATE ER 60 MG PO TB24
60.0000 mg | ORAL_TABLET | Freq: Every day | ORAL | Status: DC
  Administered 2024-01-28: 60 mg via ORAL
  Filled 2024-01-28: qty 1

## 2024-01-28 MED ORDER — MIDAZOLAM HCL 2 MG/2ML IJ SOLN
INTRAMUSCULAR | Status: AC
  Filled 2024-01-28: qty 2

## 2024-01-28 MED ORDER — FENTANYL CITRATE (PF) 100 MCG/2ML IJ SOLN
INTRAMUSCULAR | Status: AC
  Filled 2024-01-28: qty 2

## 2024-01-28 MED ORDER — LIDOCAINE HCL (PF) 1 % IJ SOLN
INTRAMUSCULAR | Status: DC | PRN
  Administered 2024-01-28: 2 mL

## 2024-01-28 MED ORDER — HEPARIN (PORCINE) IN NACL 1000-0.9 UT/500ML-% IV SOLN
INTRAVENOUS | Status: DC | PRN
  Administered 2024-01-28: 1000 mL

## 2024-01-28 MED ORDER — ISOSORBIDE MONONITRATE ER 60 MG PO TB24
60.0000 mg | ORAL_TABLET | Freq: Two times a day (BID) | ORAL | Status: DC
  Administered 2024-01-28 – 2024-01-29 (×2): 60 mg via ORAL
  Filled 2024-01-28 (×2): qty 1

## 2024-01-28 MED ORDER — FENTANYL CITRATE (PF) 100 MCG/2ML IJ SOLN
INTRAMUSCULAR | Status: DC | PRN
  Administered 2024-01-28: 25 ug via INTRAVENOUS

## 2024-01-28 MED ORDER — MIDAZOLAM HCL 2 MG/2ML IJ SOLN
INTRAMUSCULAR | Status: DC | PRN
  Administered 2024-01-28: 1 mg via INTRAVENOUS

## 2024-01-28 MED ORDER — HYDRALAZINE HCL 20 MG/ML IJ SOLN
10.0000 mg | INTRAMUSCULAR | Status: AC | PRN
Start: 2024-01-28 — End: 2024-01-28

## 2024-01-28 MED ORDER — SODIUM CHLORIDE 0.9 % IV SOLN
250.0000 mL | INTRAVENOUS | Status: DC | PRN
Start: 2024-01-28 — End: 2024-01-28

## 2024-01-28 MED ORDER — SODIUM CHLORIDE 0.9 % IV SOLN: INTRAVENOUS | Status: AC

## 2024-01-28 MED ORDER — HEPARIN (PORCINE) IN NACL 2-0.9 UNITS/ML
INTRAMUSCULAR | Status: DC | PRN
  Administered 2024-01-28: 10 mL via INTRA_ARTERIAL

## 2024-01-28 MED ORDER — CLOPIDOGREL BISULFATE 75 MG PO TABS
75.0000 mg | ORAL_TABLET | Freq: Every day | ORAL | Status: DC
  Administered 2024-01-28 – 2024-01-29 (×2): 75 mg via ORAL
  Filled 2024-01-28 (×2): qty 1

## 2024-01-28 MED ORDER — SODIUM CHLORIDE 0.9% FLUSH
3.0000 mL | Freq: Two times a day (BID) | INTRAVENOUS | Status: DC
  Administered 2024-01-29: 3 mL via INTRAVENOUS

## 2024-01-28 MED ORDER — SODIUM CHLORIDE 0.9% FLUSH: 3.0000 mL | INTRAVENOUS | Status: DC | PRN

## 2024-01-28 MED ORDER — VERAPAMIL HCL 2.5 MG/ML IV SOLN
INTRAVENOUS | Status: AC
  Filled 2024-01-28: qty 2

## 2024-01-28 MED ORDER — IOHEXOL 350 MG/ML SOLN
INTRAVENOUS | Status: DC | PRN
  Administered 2024-01-28: 15 mL

## 2024-01-28 MED ORDER — HEPARIN SODIUM (PORCINE) 1000 UNIT/ML IJ SOLN
INTRAMUSCULAR | Status: AC
  Filled 2024-01-28: qty 10

## 2024-01-28 SURGICAL SUPPLY — 7 items
CATH 5FR JL3.5 JR4 ANG PIG MP (CATHETERS) IMPLANT
CATH INFINITI 5 FR 3DRC (CATHETERS) IMPLANT
DEVICE RAD COMP TR BAND LRG (VASCULAR PRODUCTS) IMPLANT
GLIDESHEATH SLEND A-KIT 6F 22G (SHEATH) IMPLANT
GUIDEWIRE INQWIRE 1.5J.035X260 (WIRE) IMPLANT
PACK CARDIAC CATHETERIZATION (CUSTOM PROCEDURE TRAY) ×1 IMPLANT
SET ATX-X65L (MISCELLANEOUS) IMPLANT

## 2024-01-28 NOTE — Progress Notes (Signed)
 PHARMACY - ANTICOAGULATION CONSULT NOTE  Pharmacy Consult for heparin  Indication: chest pain/ACS  No Known Allergies  Patient Measurements: Height: 5' 2 (157.5 cm) Weight: 108.4 kg (239 lb) IBW/kg (Calculated) : 50.1 HEPARIN  DW (KG): 76.4  Vital Signs: Temp: 98 F (36.7 C) (07/21 0340) Temp Source: Oral (07/21 0340) BP: 117/72 (07/21 0340) Pulse Rate: 63 (07/21 0340)  Labs: Recent Labs    01/25/24 2352 01/26/24 0227 01/26/24 0452 01/26/24 0630 01/26/24 1420 01/27/24 0217 01/27/24 0242 01/27/24 1029 01/28/24 0226  HGB 14.6  --   --   --   --   --  14.0  --  12.1  HCT 44.0  --   --   --   --   --  41.7  --  36.7  PLT 321  --   --   --   --   --  243  --  224  HEPARINUNFRC  --   --   --   --    < >  --  0.52 0.59 0.67  CREATININE 1.65*  --   --   --   --  1.25*  --   --  1.39*  TROPONINIHS 12 59* 606* 1,570*  --   --   --   --   --    < > = values in this interval not displayed.    Estimated Creatinine Clearance: 58 mL/min (A) (by C-G formula based on SCr of 1.39 mg/dL (H)).   Medical History: Past Medical History:  Diagnosis Date   Anemia    after hysterectomy   Arthritis    Chronic hypertension    GERD (gastroesophageal reflux disease)    Hypertension    Obesity    Preeclampsia    Stroke (HCC) 2021   SAH, admitted at [facility] 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    Assessment: 48yo female c/o central CP associated w/ pain across upper back and abdomen as well as N/V, initial troponin negative but now rising (12>59>606) >> to begin heparin .  CBC stable (Hgb 12.1, PLT wnl).  No issues with infusion or bleeding per RN. Heparin  level therapeutic at 0.67.  Goal of Therapy:  Heparin  level 0.3-0.7 units/ml Monitor platelets by anticoagulation protocol: Yes   Plan:  Continue heparin  to 1150 units/hr Monitor heparin  level and CBC daily F/u plans for [facility]  Elma Fail, PharmD PGY1 Clinical Pharmacist [facility]  01/28/2024 7:52 AM

## 2024-01-28 NOTE — Progress Notes (Signed)
 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 pylori in 2025, arthritis, cocaine use, marijuana use, alcohol use presented with the complaints of chest pain and back pain.  Upon admission she was noted to have NSTEMI with significant rise in her troponin, cardiology was consulted she was started on heparin  drip.  Plans for LHC   Assessment & Plan:  Principal Problem:   NSTEMI (non-ST elevated myocardial infarction) [facility]) Active Problems:   Essential hypertension   Tobacco dependence   Class 3 severe obesity due to excess calories with serious comorbidity and body mass index (BMI) of 45.0 to 49.9 in adult      NSTEMI: Likely in the setting of cocaine use.  Significant rise in troponin.  Currently on heparin  drip Echocardiogram preserved EF but there is signs of apical ballooning concerning for Takotsubo. Left heart catheterization planned on today Continue IV fluids LDL 116, A1c 5.3   AKI on CKD stage IIIb: Resolved -Creatinine 1.65 on admission.  Prior baseline 1.3.  Creatinine today 1.25.  IV fluids -Holding home Diovan     Hypertension emergency: Continue home amlodipine  - For now started on Coreg .  IV as needed   NSVT: Noted on telemetry - Monitor and replete electrolytes.  Currently on Coreg    Polysubstance abuse: -Check UDS.  History of cocaine, alcohol, marijuana and tobacco abuse -Recommend cessation   Leukocytosis: Likely reactive.  Monitor   History of H. pylori infection - March 2025.  Treated appropriately with antibiotics and PPI. - Now on PPI   History of stroke Ruptured subarachnoid aneurysm: No residual weakness -Continue aspirin , statin     DVT prophylaxis: Full dose heparin  Code Status: Full code Family Communication:    Status is: Inpatient LHC today    Subjective:  Feeling okay no complaints.  Denying any chest pain.  Examination:  General exam: Appears calm and comfortable  Respiratory system: Clear to auscultation. Respiratory effort normal. Cardiovascular system: S1 & S2 heard, RRR. No JVD, murmurs, rubs, gallops or clicks. No pedal edema. Gastrointestinal system: Abdomen is nondistended, soft and nontender. No organomegaly or masses felt. Normal bowel sounds heard. Central nervous system: Alert and oriented. No focal neurological deficits. Extremities: Symmetric 5 x 5 power. Skin: No rashes, lesions or ulcers Psychiatry: Judgement and insight appear normal. Mood & affect appropriate.                Diet Orders (From admission, onward)     Start     Ordered   01/28/24 0001  Diet NPO time specified Except for: Sips with Meds  Diet effective midnight       Comments: NPO for solid foods after midnight, may have clear liquids until 5am, then NPO (this would be for inpatients and outpatients)  Question:  Except for  Answer:  Sips with Meds   01/27/24 1917            Objective: Vitals:   01/27/24 1949 01/28/24 0000 01/28/24 0340 01/28/24 0826  BP: (!) 146/80 121/63 117/72 132/83  Pulse: 70 70 63 63  Resp: 12 14 14 14   Temp: 99 F (37.2 C) 98 F (36.7 C) 98 F (36.7 C) 98 F (36.7 C)  TempSrc: Oral Oral Oral Oral  SpO2: 97% 97% 97%   Weight:  Height:        Intake/Output Summary (Last 24 hours) at 01/28/2024 1116 Last data filed at 01/27/2024 1226 Gross per 24 hour  Intake --  Output 300 ml  Net -300 ml   Filed Weights   01/26/24 0622  Weight: 108.4 kg    Scheduled Meds:  amLODipine   10 mg Oral Daily   aspirin  EC  81 mg Oral Daily   atorvastatin   80 mg Oral Daily   carvedilol   25 mg Oral BID WC   Chlorhexidine  Gluconate Cloth  6 each Topical Daily   irbesartan   150 mg Oral Daily   isosorbide  mononitrate  60 mg Oral Daily   mupirocin  ointment  1  Application Nasal BID   Continuous Infusions:  sodium chloride  75 mL/hr at 01/28/24 0842   sodium chloride  1 mL/kg/hr (01/27/24 2002)   heparin  1,150 Units/hr (01/28/24 0025)   nitroGLYCERIN  30 mcg/min (01/28/24 1105)    Nutritional status     Body mass index is 43.71 kg/m.  Data Reviewed:   CBC: Recent Labs  Lab 01/25/24 2352 01/27/24 0242 01/28/24 0226  WBC 12.9* 9.6 9.6  NEUTROABS 9.0*  --   --   HGB 14.6 14.0 12.1  HCT 44.0 41.7 36.7  MCV 96.9 95.2 94.8  PLT 321 243 224   Basic Metabolic Panel: Recent Labs  Lab 01/25/24 2352 01/26/24 1420 01/27/24 0217 01/28/24 0226  NA 137  --  140 138  K 4.1  --  4.3 4.2  CL 111  --  111 109  CO2 16*  --  18* 21*  GLUCOSE 201*  --  112* 101*  BUN 15  --  12 20  CREATININE 1.65*  --  1.25* 1.39*  CALCIUM  8.7*  --  8.5* 8.3*  MG  --  2.3  --  2.1   GFR: Estimated Creatinine Clearance: 58 mL/min (A) (by C-G formula based on SCr of 1.39 mg/dL (H)). Liver Function Tests: Recent Labs  Lab 01/25/24 2352  AST 18  ALT 18  ALKPHOS 54  BILITOT 0.6  PROT 5.3*  ALBUMIN 2.9*   Recent Labs  Lab 01/25/24 2352  LIPASE 25   No results for input(s): AMMONIA in the last 168 hours. Coagulation Profile: No results for input(s): INR, PROTIME in the last 168 hours. Cardiac Enzymes: No results for input(s): CKTOTAL, CKMB, CKMBINDEX, TROPONINI in the last 168 hours. BNP (last 3 results) No results for input(s): PROBNP in the last 8760 hours. HbA1C: Recent Labs    01/26/24 0914  HGBA1C 5.3   CBG: No results for input(s): GLUCAP in the last 168 hours. Lipid Profile: Recent Labs    01/27/24 0242  CHOL 184  HDL 48  LDLCALC 116*  TRIG 102  CHOLHDL 3.8   Thyroid Function Tests: Recent Labs    01/26/24 0856  TSH 0.927   Anemia Panel: No results for input(s): VITAMINB12, FOLATE, FERRITIN, TIBC, IRON, RETICCTPCT in the last 72 hours. Sepsis Labs: No results for input(s):  PROCALCITON, LATICACIDVEN in the last 168 hours.  Recent Results (from the past 240 hours)  MRSA Next Gen by PCR, Nasal     Status: Abnormal   Collection Time: 01/26/24 11:00 AM   Specimen: Nasal Mucosa; Nasal Swab  Result Value Ref Range Status   MRSA by PCR Next Gen DETECTED (A) NOT DETECTED Final    Comment: RESULT CALLED TO, READ BACK BY AND VERIFIED WITH: RN IZETTA EDISON [id number] AT 1440, ADC (NOTE) The GeneXpert MRSA Assay (FDA  approved for NASAL specimens only), is one component of a comprehensive MRSA colonization surveillance program. It is not intended to diagnose MRSA infection nor to guide or monitor treatment for MRSA infections. Test performance is not FDA approved in patients less than 41 years old. Performed at [facility], [address] [place], [place] [address]          Radiology Studies: No results found.         LOS: 2 days   Time spent= 35 mins    Burgess JAYSON Dare, MD [facility]  If 7PM-7AM, please contact night-coverage  01/28/2024, 11:16 AM

## 2024-01-28 NOTE — H&P (View-Only) (Signed)
   Rounding Note    Patient Name: Lisa Haley Date of Encounter: 01/28/2024  [facility] Cardiologist: Oneil Parchment, MD   Subjective   No chest pain at rest, some occasional tightness when she is up and moving. Has a headache from the nitroglycerin  drip, only slightly improved with tylenol . Planned for cath today, reviewed.  Inpatient Medications    Scheduled Meds:  amLODipine   10 mg Oral Daily   aspirin  EC  81 mg Oral Daily   atorvastatin   80 mg Oral Daily   carvedilol   25 mg Oral BID WC   Chlorhexidine  Gluconate Cloth  6 each Topical Daily   irbesartan   150 mg Oral Daily   isosorbide  mononitrate  60 mg Oral Daily   mupirocin  ointment  1 Application Nasal BID   pantoprazole   40 mg Oral BID   Continuous Infusions:  sodium chloride  75 mL/hr at 01/28/24 0842   sodium chloride  1 mL/kg/hr (01/27/24 2002)   heparin  1,150 Units/hr (01/28/24 0025)   nitroGLYCERIN  40 mcg/min (01/28/24 0842)   PRN Meds: acetaminophen , guaiFENesin , hydrALAZINE , ipratropium-albuterol , loratadine , nitroGLYCERIN , ondansetron  (ZOFRAN ) IV, oxyCODONE , senna-docusate, tiZANidine    Vital Signs    Vitals:   01/27/24 1949 01/28/24 0000 01/28/24 0340 01/28/24 0826  BP: (!) 146/80 121/63 117/72 132/83  Pulse: 70 70 63 63  Resp: 12 14 14 14   Temp: 99 F (37.2 C) 98 F (36.7 C) 98 F (36.7 C) 98 F (36.7 C)  TempSrc: Oral Oral Oral Oral  SpO2: 97% 97% 97%   Weight:      Height:        Intake/Output Summary (Last 24 hours) at 01/28/2024 0912 Last data filed at 01/27/2024 1226 Gross per 24 hour  Intake --  Output 300 ml  Net -300 ml      01/26/2024    6:22 AM 09/12/2023    8:38 AM 01/24/2023    2:26 PM  Last 3 Weights  Weight (lbs) 239 lb 239 lb 247 lb 3.2 oz  Weight (kg) 108.41 kg 108.41 kg 112.129 kg      Telemetry    SR, 4 beats NSVT - Personally Reviewed  Physical Exam   GEN: No acute distress.   Neck: No JVD Cardiac: RRR, no murmurs, rubs, or gallops.  Respiratory:  Clear to auscultation bilaterally. GI: Soft, nontender, non-distended  MS: No edema; No deformity. Neuro:  Nonfocal  Psych: Normal affect   New pertinent results (labs, ECG, imaging, cardiac studies)    Echo images personally reviewed  Assessment & Plan    Cocaine induced NSTEMI Hypertensive urgency -see discussion from Dr. Parchment' note yesterday, planned for cardiac cath today -peak hsTn 1570 -continue aspirin  81 mg daily, atorvastatin  80 mg daily -Echo with EF 50-55%, apical akinesis, no thrombus -titrating imdur  to wean NG drip. Increasing imdur  from 30 mg to 60 mg today, will wean drip today -continue heparin  until cath -continue carvedilol  25 mg BID. Do not use selective beta blockers given history of cocaine use -continue amlodipine  10 mg daily -BP controlled much improved  Chronic kidney disease stage 3a -Cr 1.39 today, range 1.25-1.65 this admission    Signed, Shelda Bruckner, MD  01/28/2024, 9:12 AM

## 2024-01-28 NOTE — TOC CM/SW Note (Signed)
 Transition of Care [facility]) - Inpatient Brief Assessment   Patient Details  Name: Lisa Haley MRN: [id number] Date of Birth: 06-Nov-1975  Transition of Care [facility]) CM/SW Contact:    Lauraine FORBES Saa, LCSW Phone Number: 01/28/2024, 9:32 AM   Clinical Narrative:  9:32 AM Per chart review, patient resides at home with with significant other and child(ren). Patient has a PCP and insurance. Patient does not have SNF/HH/DME history. Patient's preferred pharmacy is [facility]. No TOC needs were identified at this time. TOC will continue to follow and be available to assist.  Transition of Care Asessment: Insurance and Status: Insurance coverage has been reviewed Patient has primary care physician: Yes Home environment has been reviewed: Private Residence Prior level of function:: N/A Prior/Current Home Services: No current home services Social Drivers of Health Review: SDOH reviewed no interventions necessary Readmission risk has been reviewed: Yes Transition of care needs: no transition of care needs at this time

## 2024-01-28 NOTE — Plan of Care (Signed)

## 2024-01-28 NOTE — Interval H&P Note (Signed)
 History and Physical Interval Note:  01/28/2024 12:02 PM  Lisa Haley  has presented today for surgery, with the diagnosis of NSTEMI.  The various methods of treatment have been discussed with the patient and family. After consideration of risks, benefits and other options for treatment, the patient has consented to  Procedure(s): LEFT HEART CATH AND CORONARY ANGIOGRAPHY (N/A) as a surgical intervention.  The patient's history has been reviewed, patient examined, no change in status, stable for surgery.  I have reviewed the patient's chart and labs.  Questions were answered to the patient's satisfaction.     Mao Lockner J Marlin Jarrard

## 2024-01-28 NOTE — Plan of Care (Signed)
  Problem: Activity: Goal: Ability to tolerate increased activity will improve Outcome: Progressing   Problem: Education: Goal: Knowledge of General Education information will improve Description: Including pain rating scale, medication(s)/side effects and non-pharmacologic comfort measures Outcome: Progressing   Problem: Nutrition: Goal: Adequate nutrition will be maintained Outcome: Progressing

## 2024-01-28 NOTE — Progress Notes (Signed)
   Rounding Note    Patient Name: Lisa Haley Date of Encounter: 01/28/2024  [facility] Cardiologist: Oneil Parchment, MD   Subjective   No chest pain at rest, some occasional tightness when she is up and moving. Has a headache from the nitroglycerin  drip, only slightly improved with tylenol . Planned for cath today, reviewed.  Inpatient Medications    Scheduled Meds:  amLODipine   10 mg Oral Daily   aspirin  EC  81 mg Oral Daily   atorvastatin   80 mg Oral Daily   carvedilol   25 mg Oral BID WC   Chlorhexidine  Gluconate Cloth  6 each Topical Daily   irbesartan   150 mg Oral Daily   isosorbide  mononitrate  60 mg Oral Daily   mupirocin  ointment  1 Application Nasal BID   pantoprazole   40 mg Oral BID   Continuous Infusions:  sodium chloride  75 mL/hr at 01/28/24 0842   sodium chloride  1 mL/kg/hr (01/27/24 2002)   heparin  1,150 Units/hr (01/28/24 0025)   nitroGLYCERIN  40 mcg/min (01/28/24 0842)   PRN Meds: acetaminophen , guaiFENesin , hydrALAZINE , ipratropium-albuterol , loratadine , nitroGLYCERIN , ondansetron  (ZOFRAN ) IV, oxyCODONE , senna-docusate, tiZANidine    Vital Signs    Vitals:   01/27/24 1949 01/28/24 0000 01/28/24 0340 01/28/24 0826  BP: (!) 146/80 121/63 117/72 132/83  Pulse: 70 70 63 63  Resp: 12 14 14 14   Temp: 99 F (37.2 C) 98 F (36.7 C) 98 F (36.7 C) 98 F (36.7 C)  TempSrc: Oral Oral Oral Oral  SpO2: 97% 97% 97%   Weight:      Height:        Intake/Output Summary (Last 24 hours) at 01/28/2024 0912 Last data filed at 01/27/2024 1226 Gross per 24 hour  Intake --  Output 300 ml  Net -300 ml      01/26/2024    6:22 AM 09/12/2023    8:38 AM 01/24/2023    2:26 PM  Last 3 Weights  Weight (lbs) 239 lb 239 lb 247 lb 3.2 oz  Weight (kg) 108.41 kg 108.41 kg 112.129 kg      Telemetry    SR, 4 beats NSVT - Personally Reviewed  Physical Exam   GEN: No acute distress.   Neck: No JVD Cardiac: RRR, no murmurs, rubs, or gallops.  Respiratory:  Clear to auscultation bilaterally. GI: Soft, nontender, non-distended  MS: No edema; No deformity. Neuro:  Nonfocal  Psych: Normal affect   New pertinent results (labs, ECG, imaging, cardiac studies)    Echo images personally reviewed  Assessment & Plan    Cocaine induced NSTEMI Hypertensive urgency -see discussion from Dr. Parchment' note yesterday, planned for cardiac cath today -peak hsTn 1570 -continue aspirin  81 mg daily, atorvastatin  80 mg daily -Echo with EF 50-55%, apical akinesis, no thrombus -titrating imdur  to wean NG drip. Increasing imdur  from 30 mg to 60 mg today, will wean drip today -continue heparin  until cath -continue carvedilol  25 mg BID. Do not use selective beta blockers given history of cocaine use -continue amlodipine  10 mg daily -BP controlled much improved  Chronic kidney disease stage 3a -Cr 1.39 today, range 1.25-1.65 this admission    Signed, Shelda Bruckner, MD  01/28/2024, 9:12 AM

## 2024-01-28 NOTE — Plan of Care (Signed)
  Problem: Activity: Goal: Ability to tolerate increased activity will improve Outcome: Progressing   Problem: Clinical Measurements: Goal: Respiratory complications will improve Outcome: Progressing Goal: Cardiovascular complication will be avoided Outcome: Progressing   Problem: Activity: Goal: Risk for activity intolerance will decrease Outcome: Progressing   Problem: Nutrition: Goal: Adequate nutrition will be maintained Outcome: Progressing   Problem: Coping: Goal: Level of anxiety will decrease Outcome: Progressing   Problem: Elimination: Goal: Will not experience complications related to bowel motility Outcome: Progressing Goal: Will not experience complications related to urinary retention Outcome: Progressing   Problem: Pain Managment: Goal: General experience of comfort will improve and/or be controlled Outcome: Progressing   Problem: Safety: Goal: Ability to remain free from injury will improve Outcome: Progressing

## 2024-01-29 ENCOUNTER — Telehealth: Payer: Self-pay | Admitting: Internal Medicine

## 2024-01-29 ENCOUNTER — Other Ambulatory Visit (HOSPITAL_COMMUNITY): Payer: Self-pay

## 2024-01-29 DIAGNOSIS — I10 Essential (primary) hypertension: Secondary | ICD-10-CM

## 2024-01-29 DIAGNOSIS — F1410 Cocaine abuse, uncomplicated: Secondary | ICD-10-CM

## 2024-01-29 DIAGNOSIS — I214 Non-ST elevation (NSTEMI) myocardial infarction: Secondary | ICD-10-CM | POA: Diagnosis not present

## 2024-01-29 LAB — CBC
HCT: 30.5 % — ABNORMAL LOW (ref 36.0–46.0)
Hemoglobin: 9.9 g/dL — ABNORMAL LOW (ref 12.0–15.0)
MCH: 31.7 pg (ref 26.0–34.0)
MCHC: 32.5 g/dL (ref 30.0–36.0)
MCV: 97.8 fL (ref 80.0–100.0)
Platelets: 197 K/uL (ref 150–400)
RBC: 3.12 MIL/uL — ABNORMAL LOW (ref 3.87–5.11)
RDW: 14.1 % (ref 11.5–15.5)
WBC: 6 K/uL (ref 4.0–10.5)
nRBC: 0 % (ref 0.0–0.2)

## 2024-01-29 LAB — BASIC METABOLIC PANEL WITH GFR
Anion gap: 5 (ref 5–15)
BUN: 13 mg/dL (ref 6–20)
CO2: 19 mmol/L — ABNORMAL LOW (ref 22–32)
Calcium: 7.2 mg/dL — ABNORMAL LOW (ref 8.9–10.3)
Chloride: 115 mmol/L — ABNORMAL HIGH (ref 98–111)
Creatinine, Ser: 1.16 mg/dL — ABNORMAL HIGH (ref 0.44–1.00)
GFR, Estimated: 59 mL/min — ABNORMAL LOW (ref >60)
Glucose, Bld: 139 mg/dL — ABNORMAL HIGH (ref 70–99)
Potassium: 3.7 mmol/L (ref 3.5–5.1)
Sodium: 139 mmol/L (ref 135–145)

## 2024-01-29 LAB — HEPARIN LEVEL (UNFRACTIONATED): Heparin Unfractionated: <0.1 [IU]/mL — ABNORMAL LOW (ref 0.30–0.70)

## 2024-01-29 LAB — LIPOPROTEIN A (LPA): Lipoprotein (a): 201.2 nmol/L — ABNORMAL HIGH (ref <75.0)

## 2024-01-29 LAB — MAGNESIUM: Magnesium: 1.6 mg/dL — ABNORMAL LOW (ref 1.7–2.4)

## 2024-01-29 MED ORDER — ATORVASTATIN CALCIUM 80 MG PO TABS
80.0000 mg | ORAL_TABLET | Freq: Every day | ORAL | 0 refills | Status: DC
  Filled 2024-01-29: qty 30, 30d supply, fill #0

## 2024-01-29 MED ORDER — CLOPIDOGREL BISULFATE 75 MG PO TABS
75.0000 mg | ORAL_TABLET | Freq: Every day | ORAL | 0 refills | Status: DC
  Filled 2024-01-29: qty 30, 30d supply, fill #0

## 2024-01-29 MED ORDER — MAGNESIUM SULFATE 2 GM/50ML IV SOLN
2.0000 g | Freq: Once | INTRAVENOUS | Status: AC
  Administered 2024-01-29: 2 g via INTRAVENOUS
  Filled 2024-01-29: qty 50

## 2024-01-29 MED ORDER — ISOSORBIDE MONONITRATE ER 60 MG PO TB24
60.0000 mg | ORAL_TABLET | Freq: Two times a day (BID) | ORAL | 0 refills | Status: DC
  Filled 2024-01-29: qty 60, 30d supply, fill #0

## 2024-01-29 MED ORDER — CARVEDILOL 25 MG PO TABS
25.0000 mg | ORAL_TABLET | Freq: Two times a day (BID) | ORAL | 0 refills | Status: DC
Start: 2024-01-29 — End: 2024-04-16
  Filled 2024-01-29: qty 60, 30d supply, fill #0

## 2024-01-29 MED ORDER — IRBESARTAN 150 MG PO TABS
150.0000 mg | ORAL_TABLET | Freq: Every day | ORAL | 0 refills | Status: DC
  Filled 2024-01-29: qty 30, 30d supply, fill #0

## 2024-01-29 MED ORDER — MAGNESIUM OXIDE -MG SUPPLEMENT 400 (240 MG) MG PO TABS
800.0000 mg | ORAL_TABLET | Freq: Once | ORAL | Status: AC
  Administered 2024-01-29: 800 mg via ORAL
  Filled 2024-01-29: qty 2

## 2024-01-29 MED ORDER — ASPIRIN 81 MG PO TBEC
81.0000 mg | DELAYED_RELEASE_TABLET | Freq: Every day | ORAL | 0 refills | Status: DC
  Filled 2024-01-29: qty 90, 90d supply, fill #0

## 2024-01-29 NOTE — Discharge Instructions (Signed)
Radial Site Care Refer to this sheet in the next few weeks. These instructions provide you with information on caring for yourself after your procedure. Your caregiver may also give you more specific instructions. Your treatment has been planned according to current medical practices, but problems sometimes occur. Call your caregiver if you have any problems or questions after your procedure. HOME CARE INSTRUCTIONS  You may shower the day after the procedure.Remove the bandage (dressing) and gently wash the site with plain soap and water.Gently pat the site dry.   Do not apply powder or lotion to the site.   Do not submerge the affected site in water for 3 to 5 days.   Inspect the site at least twice daily.   Do not flex or bend the affected arm for 24 hours.   No lifting over 5 pounds (2.3 kg) for 5 days after your procedure.   Do not drive home if you are discharged the same day of the procedure. Have someone else drive you.   You may drive 24 hours after the procedure unless otherwise instructed by your caregiver.  What to expect:  Any bruising will usually fade within 1 to 2 weeks.   Blood that collects in the tissue (hematoma) may be painful to the touch. It should usually decrease in size and tenderness within 1 to 2 weeks.  SEEK IMMEDIATE MEDICAL CARE IF:  You have unusual pain at the radial site.   You have redness, warmth, swelling, or pain at the radial site.   You have drainage (other than a small amount of blood on the dressing).   You have chills.   You have a fever or persistent symptoms for more than 72 hours.   You have a fever and your symptoms suddenly get worse.   Your arm becomes pale, cool, tingly, or numb.   You have heavy bleeding from the site. Hold pressure on the site.

## 2024-01-29 NOTE — Telephone Encounter (Signed)
 Copied from CRM [id number]. Topic: Appointments - Scheduling Inquiry for Clinic  >> Jan 29, 2024 11:26 AM Lisa Haley wrote: Reason for CRM: Patient is scheduled for [facility] FU for 03/04/24 and added to [facility] wait list.  She is being discharged today and needs to be seen in one week but nothing was available.

## 2024-01-29 NOTE — Discharge Summary (Signed)
 Physician Discharge Summary  Lisa Haley FMW:[id number] DOB: January 01, 1976 DOA: 01/25/2024  PCP: Vicci Barnie NOVAK, MD  Admit date: 01/25/2024 Discharge date: 01/29/2024  Admitted From: Home Disposition: Home  Recommendations for Outpatient Follow-up:  Follow up with PCP in 1-2 weeks Please obtain BMP/CBC in one week your next doctors visit.  Aspirin , statin, Coreg , Plavix , Avapro , Imdur  started.  Confirm with cardiology that they would like Imdur  60 mg ER to be taken twice daily. 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 pylori in 2025, arthritis, cocaine use, marijuana use, alcohol use presented with the complaints of chest pain and back pain. Initially started on heparin  drip due to significant rise in troponin.  This was likely cocaine induced.  LHC on 7/21 showed sluggish flow but no obvious stenosis.  Cardiology recommending DAPT for 1 year minimum and medical management.  Counseled to quit using cocaine LDL 116, A1c 5.3  Medications adjusted per cardiology team.  Will discharge her in stable condition.   Assessment & Plan:  Principal Problem:   NSTEMI (non-ST elevated myocardial infarction) (HCC) Active Problems:   Essential hypertension   Tobacco dependence   Class 3 severe obesity due to excess calories with serious comorbidity and body mass index (BMI) of 45.0 to 49.9 in adult      NSTEMI: Initially started on heparin  drip due to significant rise in troponin.  This was likely cocaine induced.  LHC on 7/21 showed sluggish flow but no obvious stenosis.  Cardiology recommending DAPT for 1 year minimum and medical management.  Counseled to quit using cocaine LDL 116, A1c 5.3 Medications per cardiology team.    AKI on CKD stage IIIb: Resolved -Creatinine 1.65 on admission.  Prior baseline 1.3.  Creatinine today 1.16.   -Holding home Diovan     Hypertension emergency:  On Coreg , Imdur , Norvasc , Avapro .  Discontinue nitroglycerin  drip    NSVT: Noted on telemetry - Monitor and replete electrolytes.  Currently on Coreg    Polysubstance abuse: Consult to quit using illicit drugs.  UDS positive for cocaine   Leukocytosis: Likely reactive.  Monitor   History of H. pylori infection - March 2025.  Treated appropriately with antibiotics and PPI. - Now on PPI   History of stroke Ruptured subarachnoid aneurysm: No residual weakness -Continue aspirin , statin     DVT prophylaxis: Full dose heparin  Code Status: Full code Family Communication:   Hopefully discharge once cleared by cardiology   Subjective:  Feeling okay no complaints.  Denying any chest pain.  Examination:  General exam: Appears calm and comfortable  Respiratory system: Clear to auscultation. Respiratory effort normal. Cardiovascular system: S1 & S2 heard, RRR. No JVD, murmurs, rubs, gallops or clicks. No pedal edema. Gastrointestinal system: Abdomen is nondistended, soft and nontender. No organomegaly or masses felt. Normal bowel sounds heard. Central nervous system: Alert and oriented. No focal neurological deficits. Extremities: Symmetric 5 x 5 power. Skin: No rashes, lesions or ulcers Psychiatry: Judgement and insight appear normal. Mood & affect appropriate.    Discharge Diagnoses:  Principal Problem:   NSTEMI (non-ST elevated myocardial infarction) [facility]) Active Problems:   Essential hypertension   Tobacco dependence   Class 3 severe obesity due to excess calories with serious comorbidity and body mass index (BMI) of 45.0 to 49.9 in adult  Discharge Exam: Vitals:   01/29/24 0740 01/29/24 1133  BP:  115/74  Pulse: 68 (!) 55  Resp: 16 16  Temp: 98.7 F (37.1 C) 97.7 F (36.5 C)  SpO2: 97% 96%    Vitals:   01/29/24 0448 01/29/24 0600 01/29/24 0740 01/29/24 1133  BP: 116/73   115/74  Pulse: 66 69 68 (!) 55  Resp: 16 15 16 16   Temp: 97.6 F (36.4 C)  98.7 F (37.1 C) 97.7 F (36.5 C)  TempSrc: Oral  Oral Oral  SpO2: 98% 98% 97% 96%  Weight:      Height:          Discharge Instructions  Discharge Instructions     Amb Referral to Cardiac Rehabilitation   Complete by: As directed    Diagnosis: NSTEMI   After initial evaluation and assessments completed: Virtual Based Care may be provided alone or in conjunction with Phase 2 Cardiac Rehab based on patient barriers.: Yes   Intensive Cardiac Rehabilitation (ICR) [facility] location only OR Traditional Cardiac Rehabilitation (TCR) *If criteria for ICR are not met will enroll in TCR [facility] only): Yes      Allergies as of 01/29/2024   No Known Allergies      Medication List     STOP taking these medications    meloxicam  15 MG tablet Commonly known as: Mobic    valsartan  40 MG tablet Commonly known as: DIOVAN        TAKE these medications    acetaminophen -codeine  300-30 MG tablet Commonly known as: TYLENOL  #3 Take 1 tablet by mouth at bedtime as needed for moderate pain (pain score 4-6).   amLODipine  10 MG tablet Commonly known as: NORVASC  Take 1 tablet (10 mg total) by mouth daily.   aspirin  EC 81 MG tablet Take 1 tablet (81 mg total) by mouth daily. Swallow whole. Start taking on: January 30, 2024   atorvastatin  80 MG tablet Commonly known as: LIPITOR  Take 1 tablet (80 mg total) by mouth daily. Start taking on: January 30, 2024   carvedilol  25 MG tablet Commonly known as: COREG  Take 1 tablet (25 mg total) by mouth 2 (two) times daily with a meal.   clopidogrel  75 MG tablet Commonly known as: PLAVIX  Take 1 tablet (75 mg total) by mouth daily. Start taking on: January 30, 2024   DULoxetine  20 MG capsule Commonly known as: CYMBALTA  Take 1 capsule (20 mg total) by mouth daily.   gabapentin  300 MG  capsule Commonly known as: NEURONTIN  Take 1 capsule (300 mg total) by mouth at bedtime.   irbesartan  150 MG tablet Commonly known as: AVAPRO  Take 1 tablet (150 mg total) by mouth daily. Start taking on: January 30, 2024   isosorbide  mononitrate 60 MG 24 hr tablet Commonly known as: IMDUR  Take 1 tablet (60 mg total) by mouth 2 (two) times daily at 10 am and 4 pm.   pantoprazole  40 MG tablet Commonly known as: PROTONIX  Take 1 tablet (40 mg total) by mouth daily.   Ventolin  HFA 108 (90 Base) MCG/ACT inhaler Generic drug: albuterol  Inhale 2 puffs into the lungs every 6 (six) hours as needed for wheezing or shortness of breath.        Follow-up Information     Goodrich, Callie E, PA-C. Go on 02/08/2024.   Specialty: Cardiology Why: Please go to cardiology follow up with Aline Door, PA on 02/08/24 at 2:45 PM at the new cardiology office on [facility]. Contact information: [address] [place] [address] [phone]  Vicci Barnie NOVAK, MD Follow up in 1 week(s).   Specialty: Internal Medicine Why: Aug 26 ,2025 @ 9:50 AM Contact information: [address] [place] [address] [phone]                No Known Allergies  You were cared for by a hospitalist during your hospital stay. If you have any questions about your discharge medications or the care you received while you were in the hospital after you are discharged, you can call the unit and asked to speak with the hospitalist on call if the hospitalist that took care of you is not available. Once you are discharged, your primary care physician will handle any further medical issues. Please note that no refills for any discharge medications will be authorized once you are discharged, as it is imperative that you return to your primary care physician (or establish a relationship with a primary care physician if you do not have one) for your aftercare needs so that they can reassess  your need for medications and monitor your lab values.  You were cared for by a hospitalist during your hospital stay. If you have any questions about your discharge medications or the care you received while you were in the hospital after you are discharged, you can call the unit and asked to speak with the hospitalist on call if the hospitalist that took care of you is not available. Once you are discharged, your primary care physician will handle any further medical issues. Please note that NO REFILLS for any discharge medications will be authorized once you are discharged, as it is imperative that you return to your primary care physician (or establish a relationship with a primary care physician if you do not have one) for your aftercare needs so that they can reassess your need for medications and monitor your lab values.  Please request your Prim.MD to go over all Hospital Tests and Procedure/Radiological results at the follow up, please get all Hospital records sent to your Prim MD by signing hospital release before you go home.  Get CBC, CMP, 2 view Chest X ray checked  by Primary MD during your next visit or SNF MD in 5-7 days ( we routinely change or add medications that can affect your baseline labs and fluid status, therefore we recommend that you get the mentioned basic workup next visit with your PCP, your PCP may decide not to get them or add new tests based on their clinical decision)  On your next visit with your primary care physician please Get Medicines reviewed and adjusted.  If you experience worsening of your admission symptoms, develop shortness of breath, life threatening emergency, suicidal or homicidal thoughts you must seek medical attention immediately by calling 911 or calling your MD immediately  if symptoms less severe.  You Must read complete instructions/literature along with all the possible adverse reactions/side effects for all the Medicines you take and that have  been prescribed to you. Take any new Medicines after you have completely understood and accpet all the possible adverse reactions/side effects.   Do not drive, operate heavy machinery, perform activities at heights, swimming or participation in water  activities or provide baby sitting services if your were admitted for syncope or siezures until you have seen by Primary MD or a Neurologist and advised to do so again.  Do not drive when taking Pain medications.   Procedures/Studies: CARDIAC CATHETERIZATION Result Date: 01/28/2024 Coronary angiography 01/28/2024: LM: Normal  LAD: No significant disease Lcx: No significant disease RCA: No significant disease LVEDP 15 mmHg No significant epicardial coronary artery disease Sluggish flow in all coronaries Suspect Cocaine induced NSTEMI Recommend DAPT with Aspirin  and Plavix  for 1 year for medical management of NSTEMI Lisa JINNY Lawrence, MD   ECHOCARDIOGRAM COMPLETE Result Date: 01/26/2024    ECHOCARDIOGRAM REPORT   Patient Name:   Lisa Haley Date of Exam: 01/26/2024 Medical Rec #:  [id number]           Height:       62.0 in Accession #:    [id number]          Weight:       239.0 lb Date of Birth:  1976-02-03            BSA:          2.062 m Patient Age:    48 years            BP:           197/128 mmHg Patient Gender: F                   HR:           82 bpm. Exam Location:  Inpatient Procedure: 2D Echo, Cardiac Doppler, Color Doppler and Strain Analysis (Both            Spectral and Color Flow Doppler were utilized during procedure). Indications:    Chest Pain R07.9  History:        Patient has no prior history of Echocardiogram examinations.                 Previous Myocardial Infarction, Migraine and Stroke,                 Signs/Symptoms:Shortness of Breath; Risk Factors:Hypertension                 and Current Smoker.  Sonographer:    Thea Norlander RCS Referring Phys: [id number] VELNA SAUNDERS PAHWANI IMPRESSIONS  1. There is no evidence of left ventricular  apical thrombus. There is a limited area of severe hypokinesis and subtle dyskinesis of the apex, generally suggesting a pattern of apical ballooning. Left ventricular ejection fraction, by estimation, is 50 to 55%. The left ventricle has low normal function. The left ventricle demonstrates regional wall motion abnormalities (see scoring diagram/findings for description). There is mild concentric left ventricular hypertrophy. Left ventricular diastolic parameters are consistent with Grade I diastolic dysfunction (impaired relaxation). There is severe hypokinesis of the left ventricular, entire apical segment.  2. Right ventricular systolic function is normal. The right ventricular size is normal. Tricuspid regurgitation signal is inadequate for assessing PA pressure.  3. The mitral valve is normal in structure. No evidence of mitral valve regurgitation. No evidence of mitral stenosis.  4. The aortic valve is tricuspid. Aortic valve regurgitation is not visualized. No aortic stenosis is present.  5. The inferior vena cava is normal in size with greater than 50% respiratory variability, suggesting right atrial pressure of 3 mmHg. Comparison(s): No prior Echocardiogram. Findings suggest possible takotsubo syndrome, but cannot exclude distal LAD artery distribution ischemia/infarction. FINDINGS  Left Ventricle: There is no evidence of left ventricular apical thrombus. There is a limited area of severe hypokinesis and subtle dyskinesis of the apex, generally suggesting a pattern of apical ballooning. Left ventricular ejection fraction, by estimation, is 50 to 55%. The left ventricle has low normal function. The left ventricle demonstrates regional  wall motion abnormalities. Severe hypokinesis of the left ventricular, entire apical segment. Global longitudinal strain performed but not reported based on interpreter judgement due to suboptimal tracking. The left ventricular internal cavity size was normal in size. There is  mild concentric left ventricular hypertrophy. Left ventricular diastolic parameters are consistent with Grade I diastolic dysfunction (impaired relaxation).  LV Wall Scoring: The apical lateral segment, apical anterior segment, and apex are dyskinetic. Right Ventricle: The right ventricular size is normal. No increase in right ventricular wall thickness. Right ventricular systolic function is normal. Tricuspid regurgitation signal is inadequate for assessing PA pressure. Left Atrium: Left atrial size was normal in size. Right Atrium: Right atrial size was normal in size. Pericardium: There is no evidence of pericardial effusion. Mitral Valve: The mitral valve is normal in structure. No evidence of mitral valve regurgitation. No evidence of mitral valve stenosis. Tricuspid Valve: The tricuspid valve is normal in structure. Tricuspid valve regurgitation is not demonstrated. No evidence of tricuspid stenosis. Aortic Valve: The aortic valve is tricuspid. Aortic valve regurgitation is not visualized. No aortic stenosis is present. Aortic valve peak gradient measures 5.1 mmHg. Pulmonic Valve: The pulmonic valve was grossly normal. Pulmonic valve regurgitation is not visualized. No evidence of pulmonic stenosis. Aorta: The aortic root and ascending aorta are structurally normal, with no evidence of dilitation. Venous: The inferior vena cava is normal in size with greater than 50% respiratory variability, suggesting right atrial pressure of 3 mmHg. IAS/Shunts: No atrial level shunt detected by color flow Doppler.  LEFT VENTRICLE PLAX 2D LVIDd:         4.20 cm   Diastology LVIDs:         2.90 cm   LV e' medial:    5.11 cm/s LV PW:         1.20 cm   LV E/e' medial:  9.5 LV IVS:        1.35 cm   LV e' lateral:   3.32 cm/s LVOT diam:     2.10 cm   LV E/e' lateral: 14.7 LV SV:         54 LV SV Index:   26 LVOT Area:     3.46 cm  RIGHT VENTRICLE             IVC RV S prime:     11.90 cm/s  IVC diam: 1.00 cm TAPSE (M-mode): 2.0 cm  LEFT ATRIUM             Index        RIGHT ATRIUM          Index LA diam:        3.20 cm 1.55 cm/m   RA Area:     9.90 cm LA Vol (A2C):   24.3 ml 11.78 ml/m  RA Volume:   19.20 ml 9.31 ml/m LA Vol (A4C):   26.1 ml 12.66 ml/m LA Biplane Vol: 26.6 ml 12.90 ml/m  AORTIC VALVE AV Area (Vmax): 2.55 cm AV Vmax:        113.00 cm/s AV Peak Grad:   5.1 mmHg LVOT Vmax:      83.30 cm/s LVOT Vmean:     54.000 cm/s LVOT VTI:       0.155 m  AORTA Ao Root diam: 3.20 cm Ao Asc diam:  3.00 cm MITRAL VALVE MV Area (PHT): 4.80 cm    SHUNTS MV Decel Time: 158 msec    Systemic VTI:  0.16 m MV E velocity: 48.70 cm/s  Systemic Diam: 2.10 cm MV A velocity: 84.40 cm/s MV E/A ratio:  0.58 Mihai Croitoru MD Electronically signed by Jerel Balding MD Signature Date/Time: 01/26/2024/12:55:39 PM    Final    CT ABDOMEN PELVIS W CONTRAST Result Date: 01/26/2024 CLINICAL DATA:  Abdominal pain. EXAM: CT ABDOMEN AND PELVIS WITH CONTRAST TECHNIQUE: Multidetector CT imaging of the abdomen and pelvis was performed using the standard protocol following bolus administration of intravenous contrast. RADIATION DOSE REDUCTION: This exam was performed according to the departmental dose-optimization program which includes automated exposure control, adjustment of the mA and/or kV according to patient size and/or use of iterative reconstruction technique. CONTRAST:  75mL OMNIPAQUE  IOHEXOL  350 MG/ML SOLN COMPARISON:  July 09, 2022 FINDINGS: Lower chest: No acute abnormality. Hepatobiliary: No focal liver abnormality is seen. No gallstones, gallbladder wall thickening, or biliary dilatation. Pancreas: Unremarkable. No pancreatic ductal dilatation or surrounding inflammatory changes. Spleen: Normal in size without focal abnormality. Adrenals/Urinary Tract: There is mild, stable diffuse left adrenal gland enlargement. The right adrenal gland is unremarkable. Kidneys are normal, without renal calculi, focal lesion, or hydronephrosis. Bladder is  unremarkable. Stomach/Bowel: Stomach is within normal limits. Appendix appears normal. No evidence of bowel wall thickening, distention, or inflammatory changes. Noninflamed diverticula are seen throughout the descending and proximal to mid sigmoid colon. Vascular/Lymphatic: Aortic atherosclerosis. No enlarged abdominal or pelvic lymph nodes. Reproductive: Status post hysterectomy. No adnexal masses. Other: No abdominal wall hernia or abnormality. No abdominopelvic ascites. Musculoskeletal: No acute or significant osseous findings. IMPRESSION: 1. Colonic diverticulosis. 2. Evidence of prior hysterectomy. 3. Aortic atherosclerosis. Electronically Signed   By: Suzen Dials M.D.   On: 01/26/2024 02:05   DG Chest Portable 1 View Result Date: 01/26/2024 CLINICAL DATA:  Chest pain. EXAM: PORTABLE CHEST 1 VIEW COMPARISON:  August 08, 2016 FINDINGS: The cardiac silhouette is borderline in size. Low lung volumes are noted with subsequent crowding of the bronchovascular lung markings. No acute infiltrate, pleural effusion or pneumothorax is identified. The visualized skeletal structures are unremarkable. IMPRESSION: Low lung volumes without acute or active cardiopulmonary disease. Electronically Signed   By: Suzen Dials M.D.   On: 01/26/2024 00:16     The results of significant diagnostics from this hospitalization (including imaging, microbiology, ancillary and laboratory) are listed below for reference.     Microbiology: Recent Results (from the past 240 hours)  MRSA Next Gen by PCR, Nasal     Status: Abnormal   Collection Time: 01/26/24 11:00 AM   Specimen: Nasal Mucosa; Nasal Swab  Result Value Ref Range Status   MRSA by PCR Next Gen DETECTED (A) NOT DETECTED Final    Comment: RESULT CALLED TO, READ BACK BY AND VERIFIED WITH: RN IZETTA EDISON [phone] AT 1440, ADC (NOTE) The GeneXpert MRSA Assay (FDA approved for NASAL specimens only), is one component of a comprehensive MRSA colonization  surveillance program. It is not intended to diagnose MRSA infection nor to guide or monitor treatment for MRSA infections. Test performance is not FDA approved in patients less than 13 years old. Performed at [facility], [address], [place], [place] [address]      Labs: BNP (last 3 results) No results for input(s): BNP in the last 8760 hours. Basic Metabolic Panel: Recent Labs  Lab 01/25/24 2352 01/26/24 1420 01/27/24 0217 01/28/24 0226 01/29/24 0129  NA 137  --  140 138 139  K 4.1  --  4.3 4.2 3.7  CL 111  --  111 109 115*  CO2 16*  --  18* 21* 19*  GLUCOSE 201*  --  112* 101* 139*  BUN 15  --  12 20 13   CREATININE 1.65*  --  1.25* 1.39* 1.16*  CALCIUM  8.7*  --  8.5* 8.3* 7.2*  MG  --  2.3  --  2.1 1.6*   Liver Function Tests: Recent Labs  Lab 01/25/24 2352  AST 18  ALT 18  ALKPHOS 54  BILITOT 0.6  PROT 5.3*  ALBUMIN 2.9*   Recent Labs  Lab 01/25/24 2352  LIPASE 25   No results for input(s): AMMONIA in the last 168 hours. CBC: Recent Labs  Lab 01/25/24 2352 01/27/24 0242 01/28/24 0226 01/29/24 0129  WBC 12.9* 9.6 9.6 6.0  NEUTROABS 9.0*  --   --   --   HGB 14.6 14.0 12.1 9.9*  HCT 44.0 41.7 36.7 30.5*  MCV 96.9 95.2 94.8 97.8  PLT 321 243 224 197   Cardiac Enzymes: No results for input(s): CKTOTAL, CKMB, CKMBINDEX, TROPONINI in the last 168 hours. BNP: Invalid input(s): POCBNP CBG: No results for input(s): GLUCAP in the last 168 hours. D-Dimer No results for input(s): DDIMER in the last 72 hours. Hgb A1c No results for input(s): HGBA1C in the last 72 hours. Lipid Profile Recent Labs    01/27/24 0242  CHOL 184  HDL 48  LDLCALC 116*  TRIG 102  CHOLHDL 3.8   Thyroid function studies No results for input(s): TSH, T4TOTAL, T3FREE, THYROIDAB in the last 72 hours.  Invalid input(s): FREET3 Anemia work up No results for input(s): VITAMINB12, FOLATE, FERRITIN, TIBC, IRON, RETICCTPCT in  the last 72 hours. Urinalysis    Component Value Date/Time   COLORURINE AMBER (A) 02/01/2016 2156   APPEARANCEUR CLOUDY (A) 02/01/2016 2156   LABSPEC 1.034 (H) 02/01/2016 2156   PHURINE 6.0 02/01/2016 2156   GLUCOSEU NEGATIVE 02/01/2016 2156   HGBUR NEGATIVE 02/01/2016 2156   BILIRUBINUR small (A) 11/09/2021 1519   KETONESUR negative 11/09/2021 1519   KETONESUR 15 (A) 02/01/2016 2156   PROTEINUR NEGATIVE 02/01/2016 2156   UROBILINOGEN 1.0 11/09/2021 1519   UROBILINOGEN 1.0 03/05/2014 1957   NITRITE Negative 11/09/2021 1519   NITRITE NEGATIVE 02/01/2016 2156   LEUKOCYTESUR Negative 11/09/2021 1519   Sepsis Labs Recent Labs  Lab 01/25/24 2352 01/27/24 0242 01/28/24 0226 01/29/24 0129  WBC 12.9* 9.6 9.6 6.0   Microbiology Recent Results (from the past 240 hours)  MRSA Next Gen by PCR, Nasal     Status: Abnormal   Collection Time: 01/26/24 11:00 AM   Specimen: Nasal Mucosa; Nasal Swab  Result Value Ref Range Status   MRSA by PCR Next Gen DETECTED (A) NOT DETECTED Final    Comment: RESULT CALLED TO, READ BACK BY AND VERIFIED WITH: RN IZETTA EDISON [phone] AT 1440, ADC (NOTE) The GeneXpert MRSA Assay (FDA approved for NASAL specimens only), is one component of a comprehensive MRSA colonization surveillance program. It is not intended to diagnose MRSA infection nor to guide or monitor treatment for MRSA infections. Test performance is not FDA approved in patients less than 13 years old. Performed at [facility], [address] [place], [place] [address]      Time coordinating discharge:  I have spent 35 minutes face to face with the patient and on the ward discussing the patients care, assessment, plan and disposition with other care givers. >50% of the time was devoted counseling the patient about the risks and benefits of treatment/Discharge disposition and coordinating care.   SIGNED:  Burgess JAYSON Dare, MD  [facility] 01/29/2024, 11:50 AM   If 7PM-7AM,  please contact night-coverage

## 2024-01-29 NOTE — TOC Transition Note (Signed)
 Transition of Care [facility]) - Discharge Note   Patient Details  Name: Lisa Haley MRN: [id number] Date of Birth: Aug 12, 1975  Transition of Care [facility]) CM/SW Contact:  Roxie KANDICE Stain, RN Phone Number: 01/29/2024, 10:28 AM   Clinical Narrative:    Samaa Ueda is stable to discharge home.  Follow up apt on AVS. No TOC needs at this time.   Final next level of care: Home/Self Care Barriers to Discharge: Barriers Resolved, ED Uninsured needing PCP establishment   Patient Goals and CMS Choice Patient states their goals for this hospitalization and ongoing recovery are:: return home          Discharge Placement                 home      Discharge Plan and Services Additional resources added to the After Visit Summary for                                       Social Drivers of Health (SDOH) Interventions SDOH Screenings   Food Insecurity: No Food Insecurity (01/26/2024)  Housing: Low Risk  (01/26/2024)  Transportation Needs: No Transportation Needs (01/26/2024)  Utilities: Not At Risk (01/26/2024)  Alcohol Screen: Low Risk  (09/12/2023)  Depression (PHQ2-9): Low Risk  (09/12/2023)  Financial Resource Strain: Medium Risk (09/12/2023)  Physical Activity: Inactive (09/12/2023)  Social Connections: Socially Isolated (09/12/2023)  Stress: No Stress Concern Present (09/12/2023)  Recent Concern: Stress - Stress Concern Present (08/29/2023)  Tobacco Use: High Risk (01/27/2024)  Health Literacy: Adequate Health Literacy (09/12/2023)     Readmission Risk Interventions    01/29/2024   10:27 AM  Readmission Risk Prevention Plan  Transportation Screening Complete  PCP or Specialist Appt within 5-7 Days Not Complete  Not Complete comments apt 02/08/24  Home Care Screening Complete  Medication Review (RN CM) Complete

## 2024-01-29 NOTE — Progress Notes (Signed)
   Rounding Note    Patient Name: Lisa Haley Date of Encounter: 01/29/2024  [facility] Cardiologist: Oneil Parchment, MD   Subjective   Reviewed cath results. Chest pain much improved. Reviewed plan for medications. She has been up and walking without worsening of her pain.  Inpatient Medications    Scheduled Meds:  amLODipine   10 mg Oral Daily   aspirin  EC  81 mg Oral Daily   atorvastatin   80 mg Oral Daily   carvedilol   25 mg Oral BID WC   clopidogrel   75 mg Oral Daily   irbesartan   150 mg Oral Daily   isosorbide  mononitrate  60 mg Oral BID   mupirocin  ointment  1 Application Nasal BID   sodium chloride  flush  3 mL Intravenous Q12H   Continuous Infusions:  magnesium  sulfate bolus IVPB     PRN Meds: acetaminophen , guaiFENesin , hydrALAZINE , ipratropium-albuterol , loratadine , nitroGLYCERIN , ondansetron  (ZOFRAN ) IV, oxyCODONE , senna-docusate, sodium chloride  flush, tiZANidine    Vital Signs    Vitals:   01/29/24 0400 01/29/24 0448 01/29/24 0600 01/29/24 0740  BP:  116/73    Pulse: 60 66 69 68  Resp: 15 16 15 16   Temp:  97.6 F (36.4 C)  98.7 F (37.1 C)  TempSrc:  Oral  Oral  SpO2: 99% 98% 98%   Weight:      Height:        Intake/Output Summary (Last 24 hours) at 01/29/2024 0950 Last data filed at 01/29/2024 0700 Gross per 24 hour  Intake 3610.1 ml  Output 0 ml  Net 3610.1 ml      01/26/2024    6:22 AM 09/12/2023    8:38 AM 01/24/2023    2:26 PM  Last 3 Weights  Weight (lbs) 239 lb 239 lb 247 lb 3.2 oz  Weight (kg) 108.41 kg 108.41 kg 112.129 kg      Telemetry    SR - Personally Reviewed  Physical Exam   GEN: Well nourished, well developed in no acute distress NECK: No JVD CARDIAC: regular rhythm, normal S1 and S2, no rubs or gallops. No murmur. R radial cath site c/d/i VASCULAR: Radial pulses 2+ bilaterally.  RESPIRATORY:  Clear to auscultation without rales, wheezing or rhonchi  ABDOMEN: Soft, non-tender,  non-distended MUSCULOSKELETAL:  Moves all 4 limbs independently SKIN: Warm and dry, no edema NEUROLOGIC:  No focal neuro deficits noted. PSYCHIATRIC:  Normal affect    New pertinent results (labs, ECG, imaging, cardiac studies)    Echo images personally reviewed Cath images personally reviewed  Assessment & Plan    Cocaine induced NSTEMI Hypertensive urgency -cath with sluggish flow but no significant CAD. Recommended for aspirin  and clopidogrel  -peak hsTn 1570 -continue aspirin  81 mg daily, atorvastatin  80 mg daily -Echo with EF 50-55%, apical akinesis, no thrombus -titrated imdur  to 60 mg BID -continue carvedilol  25 mg BID. Do not use selective beta blockers given history of cocaine use -continue amlodipine  10 mg daily, irbesartan  150 mg daily -BP controlled much improved  Chronic kidney disease stage 3a -Cr 1.16 today, range 1.25-1.65 this admission  [name] will sign off.   Medication Recommendations:  Continue amlodipine , aspirin , atorvastatin , carvedilol , clopidogrel , irbesartan , imdur  as currently ordered Other recommendations (labs, testing, etc):  none Follow up as an outpatient:  Cardiology follow up with Aline Door, PA on 02/08/24 at 2:45 PM at [facility] office     Signed, Shelda Bruckner, MD  01/29/2024, 9:50 AM

## 2024-01-29 NOTE — TOC CM/SW Note (Signed)
 Transition of Care [facility]) - Inpatient Brief Assessment   Patient Details  Name: Lisa Haley MRN: [id number] Date of Birth: [birth year]  Transition of Care [facility]) CM/SW Contact:    Lauraine FORBES Saa, LCSW Phone Number: 01/29/2024, 10:58 AM   Clinical Narrative:  10:58 AM Patient expressed interest in disability/SSI. CSW consulted financial counseling for further assistance.  Transition of Care Asessment: Insurance and Status: Insurance coverage has been reviewed Patient has primary care physician: Yes Home environment has been reviewed: Private Residence Prior level of function:: N/A Prior/Current Home Services: No current home services Social Drivers of Health Review: SDOH reviewed no interventions necessary Readmission risk has been reviewed: Yes Transition of care needs: no transition of care needs at this time

## 2024-01-29 NOTE — Progress Notes (Signed)
 CARDIAC REHAB PHASE I   PRE:  Rate/Rhythm: 68 SR  BP:  Supine: 129/79 Sitting:   Standing:    SaO2: 100% RA  MODE:  Ambulation: 340 ft   POST:  Rate/Rhythm: 73 SR  BP:  Supine:   Sitting: 119/81  Standing:    SaO2: 98% RA  [phone] MI education completed with patient including restrictions, Plavix  use, CP, NTG use, and calling 911, risk factor modification, tobacco cessation, heart healthy eating, and activity progression. MI book, 1800QUITNOW handout, heart healthy diet, and exercise guidelines handouts given. Patient verbalizes understanding of information given. Discussed Phase 2 cardiac rehab, and patient is interested in the program at [facility]. Referral sent.  Arnoldo CHRISTELLA Gal, MS, ACSM CEP 01/29/2024 1043

## 2024-01-29 NOTE — Telephone Encounter (Signed)
 Noted, patient has been scheduled for the soonest appointment available, and has been added on the wait list.

## 2024-01-30 ENCOUNTER — Telehealth: Payer: Self-pay

## 2024-01-30 NOTE — Transitions of Care (Post Inpatient/ED Visit) (Signed)
   01/30/2024  Name: Lisa Haley MRN: [id number] DOB: Dec 01, 1975  Today's TOC FU Call Status: Today's TOC FU Call Status:: Unsuccessful Call (1st Attempt) Unsuccessful Call (1st Attempt) Date: 01/30/24  Attempted to reach the patient regarding the most recent Inpatient/ED visit.  Follow Up Plan: Additional outreach attempts will be made to reach the patient to complete the Transitions of Care (Post Inpatient/ED visit) call.   Signature  Slater Diesel, RN

## 2024-01-31 ENCOUNTER — Other Ambulatory Visit: Payer: Self-pay | Admitting: Physician Assistant

## 2024-01-31 ENCOUNTER — Other Ambulatory Visit: Payer: Self-pay | Admitting: Internal Medicine

## 2024-01-31 ENCOUNTER — Telehealth: Payer: Self-pay

## 2024-01-31 ENCOUNTER — Other Ambulatory Visit (HOSPITAL_COMMUNITY): Payer: Self-pay

## 2024-01-31 DIAGNOSIS — R1030 Lower abdominal pain, unspecified: Secondary | ICD-10-CM

## 2024-01-31 DIAGNOSIS — G629 Polyneuropathy, unspecified: Secondary | ICD-10-CM

## 2024-01-31 DIAGNOSIS — A048 Other specified bacterial intestinal infections: Secondary | ICD-10-CM

## 2024-01-31 DIAGNOSIS — M62838 Other muscle spasm: Secondary | ICD-10-CM

## 2024-01-31 NOTE — Transitions of Care (Post Inpatient/ED Visit) (Signed)
   01/31/2024  Name: Lisa Haley MRN: [id number] DOB: Apr 12, 1976  Today's TOC FU Call Status: Today's TOC FU Call Status:: Unsuccessful Call (2nd Attempt) Unsuccessful Call (1st Attempt) Date: 01/30/24 Unsuccessful Call (2nd Attempt) Date: 01/31/24  Attempted to reach the patient regarding the most recent Inpatient/ED visit.  Follow Up Plan: Additional outreach attempts will be made to reach the patient to complete the Transitions of Care (Post Inpatient/ED visit) call.   Signature  Slater Diesel, RN

## 2024-02-01 ENCOUNTER — Other Ambulatory Visit (HOSPITAL_COMMUNITY): Payer: Self-pay

## 2024-02-01 ENCOUNTER — Other Ambulatory Visit: Payer: Self-pay

## 2024-02-01 ENCOUNTER — Other Ambulatory Visit: Payer: Self-pay | Admitting: Internal Medicine

## 2024-02-01 ENCOUNTER — Other Ambulatory Visit: Payer: Self-pay | Admitting: Physician Assistant

## 2024-02-01 DIAGNOSIS — M79641 Pain in right hand: Secondary | ICD-10-CM

## 2024-02-01 DIAGNOSIS — G629 Polyneuropathy, unspecified: Secondary | ICD-10-CM

## 2024-02-01 DIAGNOSIS — I10 Essential (primary) hypertension: Secondary | ICD-10-CM

## 2024-02-01 DIAGNOSIS — G8929 Other chronic pain: Secondary | ICD-10-CM

## 2024-02-01 MED ORDER — AMLODIPINE BESYLATE 10 MG PO TABS
10.0000 mg | ORAL_TABLET | Freq: Every day | ORAL | 0 refills | Status: DC
  Filled 2024-02-01: qty 30, 30d supply, fill #0

## 2024-02-02 ENCOUNTER — Other Ambulatory Visit (HOSPITAL_COMMUNITY): Payer: Self-pay

## 2024-02-02 MED ORDER — GABAPENTIN 300 MG PO CAPS
300.0000 mg | ORAL_CAPSULE | Freq: Every day | ORAL | 0 refills | Status: DC
  Filled 2024-02-02: qty 90, 90d supply, fill #0

## 2024-02-02 NOTE — Progress Notes (Deleted)
 Cardiology Office Note:    Date:  02/02/2024   ID:  Lisa Haley, DOB 04-Nov-1975, MRN [id number]  PCP:  Lisa Barnie NOVAK, MD  Cardiologist:  Oneil Parchment, MD { Click to update primary MD,subspecialty MD or APP then REFRESH:1}    Referring MD: Lisa Barnie NOVAK, MD   Chief Complaint: hospital follow-up of NSTEMI  History of Present Illness:    Lisa Haley is a 48 y.o. 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 Parchment and presents today for hospital follow-up of NSTEMI.   Patient was recently admitted from 01/25/2024 to 01/29/2024 for NSTEMI after presenting with chest pain in setting of cocaine use. BP was markedly elevated on arrival and was as high as the 220s. High-sensitivity as high as 1,570. Echo showed LVEF of 50-55% with severe hypokinesis and subtle dyskinesis of the apex (but no evidence of LV thrombus). LHC showed sluggish flow in all coronaries but no evidence of CAD. 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 Medical therapy with DAPT with Aspirin  and Plavix  was recommended for one year.  Of note, she was noted to have some NSVT during admission with longest run around 30 seconds.  Patient presents today for follow-up. ***  History of Recent Cocaine Induced NSTEMI Patient was recently admitte din 01/2024 for NSTEMI after presenting with chest pain in setting of cocaine use. Echo showed LVEF of 50-55% with severe hypokinesis and subtle dyskinesis of the apex (but no evidence of LV thrombus). LHC showed sluggish flow in all coronaries but no evidence of CAD. 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 Medical therapy was recommended.  - *** - Continue DAPT with Aspirin  and Plavix  for 1 year.   - Continue Lipitor  80mg  daily.   Non-Sustained VT Patient was noted to have non-sustained VT on telemetry during recent admission with longest run approximately 30 seconds.  - *** - Continue Coreg  25mg  twice daily.  - Monitor ***  Hypertension BP noted to be markedly elevated during recent admission with systolic BP in the 220s. Antihypertensives were adjusted. - BP *** - Continue current medications: Amlodipine  10mg  daily, Coreg  25mg  twice daily, Imdur  60mg  twice daily, and Irbesartan  150mg  daily.   Hyperlipidemia Lipid panel in 01/2024 during recent admission for NSTEMI: Total Cholesterol 184, Triglycerides 102, HDL 48, LDL 116.  - She was started on Lipitor  80mg  daily during admission. Continue.  - Will repeat lipid panel and LFTs in about 4 weeks.   CKD Stage III Baseline creatinine around 1.1 to 1.3.  - Will repeat BMET today.   Hypomagnesemia Magnesium  was 1.6 on recent discharge.  - Will repeat Magnesium .   Polysubstance Abuse ***   EKGs/Labs/Other Studies Reviewed:    The following studies were reviewed:  Echocardiogram 01/26/2024: Impressions: 1. There is no evidence of left ventricular apical thrombus. There is a  limited area of severe hypokinesis and subtle dyskinesis of the apex,  generally suggesting a pattern of apical ballooning. Left ventricular  ejection fraction, by estimation, is 50  to 55%. The left ventricle has low normal function. The left ventricle  demonstrates regional wall motion abnormalities (see scoring  diagram/findings for description). There is mild concentric left  ventricular hypertrophy. Left ventricular diastolic  parameters are consistent with Grade I diastolic dysfunction (impaired  relaxation). There is severe hypokinesis of the left ventricular, entire  apical segment.   2. Right ventricular systolic function is normal. The right ventricular  size is normal. Tricuspid regurgitation signal is inadequate for assessing  PA  pressure.   3. The mitral valve is normal in structure. No evidence of mitral valve  regurgitation. No evidence of mitral stenosis.   4. The aortic valve is tricuspid. Aortic valve regurgitation is not  visualized. No aortic stenosis is present.   5. The inferior vena cava is normal in size with greater than 50%  respiratory variability, suggesting right atrial pressure of 3 mmHg.   Comparison(s): No prior Echocardiogram. Findings suggest possible  takotsubo syndrome, but cannot exclude distal LAD artery distribution  ischemia/infarction.  _______________  Cardiac Catheterization 01/28/2024: LM: Normal LAD: No significant disease Lcx: No significant disease RCA: No significant disease   LVEDP 15 mmHg   No significant epicardial coronary artery disease Sluggish flow in all coronaries Suspect Cocaine induced NSTEMI Recommend DAPT with Aspirin  and Plavix  for 1 year for medical management of NSTEMI    EKG:  EKG not ordered today.   Recent Labs: 01/25/2024: ALT 18 01/26/2024: TSH 0.927 01/29/2024: BUN 13; Creatinine, Ser 1.16; Hemoglobin 9.9; Magnesium  1.6; Platelets 197; Potassium 3.7; Sodium 139  Recent Lipid Panel    Component Value Date/Time   CHOL 184 01/27/2024 0242   TRIG 102 01/27/2024 0242   HDL 48 01/27/2024 0242   CHOLHDL 3.8 01/27/2024 0242   VLDL 20 01/27/2024 0242   LDLCALC 116 (H) 01/27/2024 0242    Physical Exam:    Vital Signs: There were no vitals taken for this visit.    Wt Readings from Last 3 Encounters:  01/26/24 239 lb (108.4 kg)  09/12/23 239 lb (108.4 kg)  01/24/23 247 lb 3.2 oz (112.1 kg)     General: 48 y.o. female in no acute distress. HEENT: Normocephalic and atraumatic. Sclera clear.  Neck: Supple. No carotid bruits. No JVD. Heart: *** RRR. Distinct S1 and S2. No murmurs, gallops, or rubs.  Lungs: No increased work of breathing. Clear to ausculation bilaterally. No wheezes, rhonchi, or rales.  Abdomen: Soft, non-distended, and  non-tender to palpation.  Extremities: No lower extremity edema.  Radial and distal pedal pulses 2+ and equal bilaterally. Skin: Warm and dry. Neuro: No focal deficits. Psych: Normal affect. Responds appropriately.   Assessment:    No diagnosis found.  Plan:     Disposition: Follow up in ***   Signed, Aline FORBES Door, PA-C  02/02/2024 5:50 PM    [facility]

## 2024-02-04 ENCOUNTER — Telehealth: Payer: Self-pay

## 2024-02-04 ENCOUNTER — Other Ambulatory Visit (HOSPITAL_COMMUNITY): Payer: Self-pay

## 2024-02-04 ENCOUNTER — Telehealth: Payer: Self-pay | Admitting: Internal Medicine

## 2024-02-04 ENCOUNTER — Telehealth (HOSPITAL_COMMUNITY): Payer: Self-pay

## 2024-02-04 ENCOUNTER — Other Ambulatory Visit: Payer: Self-pay

## 2024-02-04 NOTE — Telephone Encounter (Signed)
Pt is not interested in the cardiac rehab. Closed referral. 

## 2024-02-04 NOTE — Telephone Encounter (Signed)
 Isosorbide  is a similar drug that they sent her out on to take twice a day.

## 2024-02-04 NOTE — Telephone Encounter (Signed)
 Copied from CRM [id number]. Topic: Clinical - Medication Question >> Feb 04, 2024 11:36 AM Graeme ORN wrote:  Reason for CRM: Patient called. States she was in hospital. She said she though Dr there put her on nitroGLYCERIN   but she was not sent home with it. She wants to know if she be taking it and how to get it if she is. Thank You

## 2024-02-04 NOTE — Transitions of Care (Post Inpatient/ED Visit) (Signed)
   02/04/2024  Name: Lisa Haley MRN: [id number] DOB: 12/03/1975  Today's TOC FU Call Status: Today's TOC FU Call Status:: Unsuccessful Call (3rd Attempt) Unsuccessful Call (1st Attempt) Date: 01/30/24 Unsuccessful Call (2nd Attempt) Date: 01/31/24 Unsuccessful Call (3rd Attempt) Date: 02/04/24  Attempted to reach the patient regarding the most recent Inpatient/ED visit.  Follow Up Plan: No further outreach attempts will be made at this time. We have been unable to contact the patient.  The patient has a follow up appointment at [facility] with Haze Servant, NP on 03/04/2024.  Signature  Slater Diesel, RN

## 2024-02-04 NOTE — Telephone Encounter (Signed)
>>   Feb 04, 2024 10:50 AM Mia F wrote: Pt was returning RN call. She asks to be called  at [phone].

## 2024-02-05 ENCOUNTER — Telehealth: Payer: Self-pay

## 2024-02-05 NOTE — Telephone Encounter (Signed)
 I called the patient and completed a TOC call which is documented in another encounter today

## 2024-02-05 NOTE — Transitions of Care (Post Inpatient/ED Visit) (Signed)
 02/05/2024  Name: Lisa Haley MRN: [id number] DOB: 08-25-1975  Today's TOC FU Call Status: Today's TOC FU Call Status:: Successful TOC FU Call Completed Unsuccessful Call (1st Attempt) Date: 01/30/24 Unsuccessful Call (2nd Attempt) Date: 01/31/24 Unsuccessful Call (3rd Attempt) Date: 02/04/24 [facility] FU Call Complete Date: 02/05/24 Patient's Name and Date of Birth confirmed.  Transition Care Management Follow-up Telephone Call Date of Discharge: 01/29/24 Discharge Facility: [facility]) Type of Discharge: Inpatient Admission Primary Inpatient Discharge Diagnosis:: NSTEMI How have you been since you were released from the hospital?: Better (She stated she is doing  pretty good.) Any questions or concerns?: Yes Patient Questions/Concerns:: she had a question about nitroglycerin  and that information was sent to Dr Vicci in another telephone  encounter Patient Questions/Concerns Addressed: Notified Provider of Patient Questions/Concerns  Items Reviewed: Did you receive and understand the discharge instructions provided?: Yes Medications obtained,verified, and reconciled?: Yes (Medications Reviewed) Any new allergies since your discharge?: No Dietary orders reviewed?: Yes Type of Diet Ordered:: heart healthy, low sodium Do you have support at home?: Yes Name of Support/Comfort Primary Source: She said she has help at home but did not specify who.  Medications Reviewed Today: Medications Reviewed Today     Reviewed by Marvis Bradley, RN (Case Manager) on 02/05/24 at 1508  Med List Status: <None>   Medication Order Taking? Sig Documenting Provider Last Dose Status Informant  acetaminophen -codeine  (TYLENOL  #3) 300-30 MG tablet [id number]  Take 1 tablet by mouth at bedtime as needed for moderate pain (pain score 4-6).  Patient not taking: Reported on 02/05/2024   Shirly Carlin CROME, PA-C  Active Self, Pharmacy Records  albuterol  (VENTOLIN  HFA) 108 [phone] Base) MCG/ACT inhaler  [id number]  Inhale 2 puffs into the lungs every 6 (six) hours as needed for wheezing or shortness of breath. Vicci Barnie NOVAK, MD  Active Self, Pharmacy Records  amLODipine  (NORVASC ) 10 MG tablet [id number]  Take 1 tablet (10 mg total) by mouth daily. Vicci Barnie NOVAK, MD  Active   aspirin  EC 81 MG tablet [id number]  Take 1 tablet (81 mg total) by mouth daily. Swallow whole. Caleen Burgess BROCKS, MD  Active   atorvastatin  (LIPITOR ) 80 MG tablet [id number]  Take 1 tablet (80 mg total) by mouth daily. Caleen Burgess BROCKS, MD  Active   carvedilol  (COREG ) 25 MG tablet [id number]  Take 1 tablet (25 mg total) by mouth 2 (two) times daily with a meal. Amin, Ankit C, MD  Active   clopidogrel  (PLAVIX ) 75 MG tablet [id number]  Take 1 tablet (75 mg total) by mouth daily. Caleen Burgess BROCKS, MD  Active   DULoxetine  (CYMBALTA ) 20 MG capsule [id number]  Take 1 capsule (20 mg total) by mouth daily.  Patient not taking: Reported on 01/26/2024   Vicci Barnie NOVAK, MD  Active Self, Pharmacy Records  gabapentin  (NEURONTIN ) 300 MG capsule [id number]  Take 1 capsule (300 mg total) by mouth at bedtime. Vicci Barnie NOVAK, MD  Active   irbesartan  (AVAPRO ) 150 MG tablet [id number]  Take 1 tablet (150 mg total) by mouth daily. Caleen Burgess BROCKS, MD  Active   isosorbide  mononitrate (IMDUR ) 60 MG 24 hr tablet [id number]  Take 1 tablet (60 mg total) by mouth 2 (two) times daily at 10 am and 4 pm. Caleen Burgess BROCKS, MD  Active   pantoprazole  (PROTONIX ) 40 MG tablet [id number]  TAKE 1 TABLET (40 MG TOTAL) BY MOUTH DAILY FOR ACID REFLUX Vicci Barnie NOVAK, MD  Active  Home Care and Equipment/Supplies: Were Home Health Services Ordered?: No Any new equipment or medical supplies ordered?: No  Functional Questionnaire: Do you need assistance with bathing/showering or dressing?: No Do you need assistance with meal preparation?: No Do you need assistance with eating?: No Do you have difficulty maintaining continence: No Do you need assistance  with getting out of bed/getting out of a chair/moving?: No Do you have difficulty managing or taking your medications?: No  Follow up appointments reviewed: PCP Follow-up appointment confirmed?: Yes Date of PCP follow-up appointment?: 03/04/24 Follow-up Provider: [facility] Follow-up appointment confirmed?: Yes Date of Specialist follow-up appointment?: 02/08/24 Follow-Up Specialty Provider:: cardiology Do you need transportation to your follow-up appointment?: No Do you understand care options if your condition(s) worsen?: Yes-patient verbalized understanding    SIGNATURE Slater Diesel, RN

## 2024-02-05 NOTE — Telephone Encounter (Signed)
 I spoke to the patient and informed her of Dr Ferdie comment about the isosorbide  and she said she has that.   She said that the doctors at the hospital told her that she would have to keep the nitroglycerin , probably, sublingual, on her at all times.

## 2024-02-06 ENCOUNTER — Other Ambulatory Visit: Payer: Self-pay

## 2024-02-06 ENCOUNTER — Other Ambulatory Visit (HOSPITAL_COMMUNITY): Payer: Self-pay

## 2024-02-06 MED ORDER — NITROGLYCERIN 0.4 MG SL SUBL
0.4000 mg | SUBLINGUAL_TABLET | SUBLINGUAL | 3 refills | Status: DC | PRN
  Filled 2024-02-06: qty 50, 18d supply, fill #0
  Filled 2024-02-06: qty 25, 30d supply, fill #0
  Filled 2024-02-20 (×2): qty 50, 18d supply, fill #1
  Filled 2024-06-21: qty 50, 18d supply, fill #2
  Filled 2024-07-28: qty 50, 18d supply, fill #3

## 2024-02-06 NOTE — Telephone Encounter (Signed)
 Prescription for sublingual nitroglycerin  has been sent to our pharmacy.

## 2024-02-06 NOTE — Addendum Note (Signed)
 Addended by: VICCI SOBER B on: 02/06/2024 08:33 AM   Modules accepted: Orders

## 2024-02-06 NOTE — Telephone Encounter (Signed)
Called patient and she is aware

## 2024-02-08 ENCOUNTER — Ambulatory Visit: Admitting: Student

## 2024-02-11 ENCOUNTER — Other Ambulatory Visit: Payer: Self-pay | Admitting: Internal Medicine

## 2024-02-11 DIAGNOSIS — G629 Polyneuropathy, unspecified: Secondary | ICD-10-CM

## 2024-02-11 DIAGNOSIS — R1030 Lower abdominal pain, unspecified: Secondary | ICD-10-CM

## 2024-02-11 DIAGNOSIS — M62838 Other muscle spasm: Secondary | ICD-10-CM

## 2024-02-12 ENCOUNTER — Other Ambulatory Visit: Payer: Self-pay | Admitting: Surgical

## 2024-02-19 ENCOUNTER — Other Ambulatory Visit (HOSPITAL_COMMUNITY): Payer: Self-pay

## 2024-02-20 ENCOUNTER — Other Ambulatory Visit: Payer: Self-pay | Admitting: Internal Medicine

## 2024-02-20 ENCOUNTER — Other Ambulatory Visit (HOSPITAL_COMMUNITY): Payer: Self-pay

## 2024-02-20 DIAGNOSIS — G8929 Other chronic pain: Secondary | ICD-10-CM

## 2024-02-20 DIAGNOSIS — G629 Polyneuropathy, unspecified: Secondary | ICD-10-CM

## 2024-02-20 DIAGNOSIS — I10 Essential (primary) hypertension: Secondary | ICD-10-CM

## 2024-02-20 DIAGNOSIS — M79641 Pain in right hand: Secondary | ICD-10-CM

## 2024-02-20 MED ORDER — AMLODIPINE BESYLATE 10 MG PO TABS
10.0000 mg | ORAL_TABLET | Freq: Every day | ORAL | 0 refills | Status: DC
  Filled 2024-02-20 – 2024-02-28 (×3): qty 30, 30d supply, fill #0

## 2024-02-21 ENCOUNTER — Other Ambulatory Visit: Payer: Self-pay

## 2024-02-21 ENCOUNTER — Other Ambulatory Visit (HOSPITAL_COMMUNITY): Payer: Self-pay

## 2024-02-22 NOTE — Progress Notes (Signed)
 Cardiology Office Note:    Date:  03/03/2024   ID:  Lisa Haley, DOB 05-07-1976, MRN [id number]  PCP:  Lisa Barnie NOVAK, MD  Cardiologist:  Oneil Parchment, MD     Referring MD: Lisa Barnie NOVAK, MD   Chief Complaint: hospital follow-up of NSTEMI  History of Present Illness:    Lisa Haley is a 48 y.o. 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 Parchment and presents today for hospital follow-up of NSTEMI.   Patient was recently admitted from 01/25/2024 to 01/29/2024 for NSTEMI after presenting with chest pain in setting of cocaine use. BP was markedly elevated on arrival and was as high as the 220s. High-sensitivity as high as 1,570. Echo showed LVEF of 50-55% with severe hypokinesis and subtle dyskinesis of the apex (but no evidence of LV thrombus). LHC showed sluggish flow in all coronaries but no evidence of CAD. 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 Medical therapy with DAPT with Aspirin  and Plavix  was recommended for one year.  Of note, she was noted to have some NSVT during admission with longest run around 30 seconds.  Patient presents today for follow-up. She is here with her boyfriend.  Since discharge, she has had 2 episodes of chest pain with activity.  The last one was about 2 weeks ago.  She called EMS and BP was noted to be markedly elevated but gradually came down.  Chest pain resolved with this as well.  Difficult to say whether chest pain was secondary to elevated BP or whether pain caused her BP to spike.  However, she has had no recurrent chest pain over the last 2 weeks even with activity.  She reports some shortness of breath when walking longer distances but no dyspnea with  routine activity or at rest.  No orthopnea or PND.  She reports occasional left leg swelling that comes and goes but this is not new.  No new edema.  She does report intermittent palpitations that she describes as heart racing.  This occurs at rest and she notes associated lightheadedness and some shortness of breath with this.  No syncope. She also reports some fatigue. However, overall she feels like she is doing better since recent discharge. She states yesterday was the first day she really did work around the house and she denies any issues with this.  She continues to smoke cigarettes but is trying to quit (currently smoking about 1/2 pack per day). However, she denies any recurrent cocaine use.  EKGs/Labs/Other Studies Reviewed:    The following studies were reviewed:  Echocardiogram 01/26/2024: Impressions: 1. There is no evidence of left ventricular apical thrombus. There is a  limited area of severe hypokinesis and subtle dyskinesis of the apex,  generally suggesting a pattern of apical ballooning. Left ventricular  ejection fraction, by estimation, is 50  to 55%. The left ventricle has low normal function. The left ventricle  demonstrates regional wall motion abnormalities (see scoring  diagram/findings for description). There is mild concentric left  ventricular hypertrophy. Left ventricular diastolic  parameters are consistent with Grade I diastolic dysfunction (impaired  relaxation). There is severe hypokinesis of the left ventricular, entire  apical segment.   2. Right ventricular systolic function is normal. The right ventricular  size  is normal. Tricuspid regurgitation signal is inadequate for assessing  PA pressure.   3. The mitral valve is normal in structure. No evidence of mitral valve  regurgitation. No evidence of mitral stenosis.   4. The aortic valve is tricuspid. Aortic valve regurgitation is not  visualized. No aortic stenosis is present.   5. The inferior vena  cava is normal in size with greater than 50%  respiratory variability, suggesting right atrial pressure of 3 mmHg.   Comparison(s): No prior Echocardiogram. Findings suggest possible  takotsubo syndrome, but cannot exclude distal LAD artery distribution  ischemia/infarction.  _______________  Cardiac Catheterization 01/28/2024: LM: Normal LAD: No significant disease Lcx: No significant disease RCA: No significant disease   LVEDP 15 mmHg   No significant epicardial coronary artery disease Sluggish flow in all coronaries Suspect Cocaine induced NSTEMI Recommend DAPT with Aspirin  and Plavix  for 1 year for medical management of NSTEMI    EKG:  EKG not ordered today.   Recent Labs: 01/25/2024: ALT 18 01/26/2024: TSH 0.927 01/29/2024: BUN 13; Creatinine, Ser 1.16; Hemoglobin 9.9; Magnesium  1.6; Platelets 197; Potassium 3.7; Sodium 139  Recent Lipid Panel    Component Value Date/Time   CHOL 184 01/27/2024 0242   TRIG 102 01/27/2024 0242   HDL 48 01/27/2024 0242   CHOLHDL 3.8 01/27/2024 0242   VLDL 20 01/27/2024 0242   LDLCALC 116 (H) 01/27/2024 0242    Physical Exam:    Vital Signs: BP 110/78   Pulse 84   Ht 5' 1 (1.549 m)   Wt 252 lb (114.3 kg)   SpO2 97%   BMI 47.61 kg/m     Wt Readings from Last 3 Encounters:  03/03/24 252 lb (114.3 kg)  01/26/24 239 lb (108.4 kg)  09/12/23 239 lb (108.4 kg)     General: 48 y.o. obese African-American female in no acute distress. HEENT: Normocephalic and atraumatic. Sclera clear.  Neck: Supple. No carotid bruits. No JVD. Heart: RRR. Distinct S1 and S2. No murmurs, gallops, or rubs.  Lungs: No increased work of breathing. Clear to ausculation bilaterally. No wheezes, rhonchi, or rales.  Extremities: No lower extremity edema.   Skin: Warm and dry. Neuro: No focal deficits. Psych: Normal affect. Responds appropriately.  Assessment:    1. Chest pain of uncertain etiology   2. History of non-ST elevation myocardial infarction  (NSTEMI)   3. Palpitations   4. NSVT (nonsustained ventricular tachycardia) (HCC)   5. Primary hypertension   6. Hyperlipidemia, unspecified hyperlipidemia type   7. Stage 3 chronic kidney disease, unspecified whether stage 3a or 3b CKD (HCC)   8. Hypomagnesemia   9. Polysubstance abuse (HCC)     Plan:    History of Recent Cocaine Induced NSTEMI Patient was recently admitte din 01/2024 for NSTEMI after presenting with chest pain in setting of cocaine use. Echo showed LVEF of 50-55% with severe hypokinesis and subtle dyskinesis of the apex (but no evidence of LV thrombus). LHC showed sluggish flow in all coronaries but no evidence of CAD. 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 Medical therapy was recommended.  - She reports 2 episodes of chest pain since recent admission that occurred with exertion but nothing in the last 2 weeks. - Continue antianginals: Amlodipine  10mg  daily, Coreg  25mg  twice daily, and Imdur  60mg  twice daily. - Continue DAPT with Aspirin  and Plavix  for 1 year.  - Continue Lipitor  80mg  daily.  - Given no recurrent chest pain in the last 2 weeks, will hold off on up-titrating  antianginals for now. However, advised patient to let us  know if she has any recurrent chest pain. Continue to increase physical activity as tolerated.  Palpitations Non-Sustained VT Patient was noted to have non-sustained VT on telemetry during recent admission with longest run approximately 30 seconds.  - She reports intermittent palpitations with associated lightheadedness and shortness of breath since discharge. - Continue Coreg  25mg  twice daily.  - Will order 2 week Zio monitor for further evaluation.  Hypertension BP noted to be markedly elevated during recent admission with systolic BP in the 220s. Antihypertensives were adjusted. - BP well controlled. - Continue current medications: Amlodipine  10mg  daily, Coreg  25mg  twice daily, Imdur  60mg  twice daily, and Irbesartan  150mg  daily.    Hyperlipidemia Lipid panel in 01/2024 during recent admission for NSTEMI: Total Cholesterol 184, Triglycerides 102, HDL 48, LDL 116.  - She was started on Lipitor  80mg  daily during admission. Continue.  - Will repeat lipid panel and LFTs in about 4 weeks.   CKD Stage III Baseline creatinine around 1.1 to 1.3.  - Will repeat BMET today.   Hypomagnesemia Magnesium  was 1.6 on recent discharge.  - Will repeat Magnesium .   Polysubstance Abuse She continue to smoke cigarettes but is trying to quit. Down to <1/2 pack per day. However, she denies any recurrent cocaine use since recent admission. - Congratulated patient on progress made so far. Encouraged her to continue to work towards complete cessation of tobacco and emphasized the importance of continuing to avoid cocaine.  Disposition: Follow up in 3 months.   Signed, Aline FORBES Door, PA-C  03/03/2024 3:05 PM    [facility]

## 2024-02-23 ENCOUNTER — Other Ambulatory Visit (HOSPITAL_COMMUNITY): Payer: Self-pay

## 2024-02-23 MED ORDER — DULOXETINE HCL 20 MG PO CPEP
20.0000 mg | ORAL_CAPSULE | Freq: Every day | ORAL | 0 refills | Status: DC
  Filled 2024-02-23: qty 30, 30d supply, fill #0

## 2024-02-26 ENCOUNTER — Other Ambulatory Visit: Payer: Self-pay

## 2024-02-28 ENCOUNTER — Other Ambulatory Visit: Payer: Self-pay

## 2024-02-28 ENCOUNTER — Other Ambulatory Visit (HOSPITAL_COMMUNITY): Payer: Self-pay

## 2024-02-28 ENCOUNTER — Other Ambulatory Visit: Payer: Self-pay | Admitting: Internal Medicine

## 2024-02-28 DIAGNOSIS — G629 Polyneuropathy, unspecified: Secondary | ICD-10-CM

## 2024-02-28 DIAGNOSIS — M79641 Pain in right hand: Secondary | ICD-10-CM

## 2024-02-28 DIAGNOSIS — G8929 Other chronic pain: Secondary | ICD-10-CM

## 2024-02-28 MED ORDER — GABAPENTIN 300 MG PO CAPS
300.0000 mg | ORAL_CAPSULE | Freq: Every day | ORAL | 0 refills | Status: AC
  Filled 2024-02-28 – 2024-06-21 (×2): qty 90, 90d supply, fill #0

## 2024-02-29 ENCOUNTER — Other Ambulatory Visit (HOSPITAL_COMMUNITY): Payer: Self-pay

## 2024-03-03 ENCOUNTER — Ambulatory Visit (INDEPENDENT_AMBULATORY_CARE_PROVIDER_SITE_OTHER)

## 2024-03-03 ENCOUNTER — Ambulatory Visit: Attending: Student | Admitting: Student

## 2024-03-03 ENCOUNTER — Encounter: Payer: Self-pay | Admitting: Student

## 2024-03-03 VITALS — BP 110/78 | HR 84 | Ht 61.0 in | Wt 252.0 lb

## 2024-03-03 DIAGNOSIS — I10 Essential (primary) hypertension: Secondary | ICD-10-CM | POA: Diagnosis not present

## 2024-03-03 DIAGNOSIS — I4729 Other ventricular tachycardia: Secondary | ICD-10-CM | POA: Diagnosis not present

## 2024-03-03 DIAGNOSIS — R002 Palpitations: Secondary | ICD-10-CM | POA: Diagnosis not present

## 2024-03-03 DIAGNOSIS — E785 Hyperlipidemia, unspecified: Secondary | ICD-10-CM | POA: Insufficient documentation

## 2024-03-03 DIAGNOSIS — I252 Old myocardial infarction: Secondary | ICD-10-CM | POA: Insufficient documentation

## 2024-03-03 DIAGNOSIS — N183 Chronic kidney disease, stage 3 unspecified: Secondary | ICD-10-CM | POA: Diagnosis not present

## 2024-03-03 DIAGNOSIS — R079 Chest pain, unspecified: Secondary | ICD-10-CM | POA: Insufficient documentation

## 2024-03-03 DIAGNOSIS — F1910 Other psychoactive substance abuse, uncomplicated: Secondary | ICD-10-CM | POA: Diagnosis not present

## 2024-03-03 NOTE — Patient Instructions (Addendum)
 Medication Instructions:  No medication changes were made during today's visit.  *If you need a refill on your cardiac medications before your next appointment, please call your pharmacy*   Lab Work: Labs will be drawn today.................. BMET, Mag  In one month, return for fasting labs....................SABRA LIPIDS AND LFT's.  If you have labs (blood work) drawn today and your tests are completely normal, you will receive your results only by: MyChart Message (if you have MyChart) OR A paper copy in the mail If you have any lab test that is abnormal or we need to change your treatment, we will call you to review the results.   Testing/Procedures:  ZIO XT- Long Term Monitor Instructions   Your physician has requested you wear your ZIO patch monitor 14 days.   This is a single patch monitor.  [employer] supplies one patch monitor per enrollment.  Additional stickers are not available.   Please do not apply patch if you will be having a Nuclear Stress Test, Echocardiogram, Cardiac CT, MRI, or Chest Xray during the time frame you would be wearing the monitor. The patch cannot be worn during these tests.  You cannot remove and re-apply the ZIO XT patch monitor.   Your ZIO patch monitor will be sent USPS Priority mail from [facility] directly to your home address. The monitor may also be mailed to a PO BOX if home delivery is not available.   It may take 3-5 days to receive your monitor after you have been enrolled.   Once you have received you monitor, please review enclosed instructions.  Your monitor has already been registered assigning a specific monitor serial # to you.   Applying the monitor   Shave hair from upper left chest.   Hold abrader disc by orange tab.  Rub abrader in 40 strokes over left upper chest as indicated in your monitor instructions.   Clean area with 4 enclosed alcohol pads .  Use all pads to assure are is cleaned thoroughly.  Let dry.   Apply patch  as indicated in monitor instructions.  Patch will be place under collarbone on left side of chest with arrow pointing upward.   Rub patch adhesive wings for 2 minutes.Remove white label marked 1.  Remove white label marked 2.  Rub patch adhesive wings for 2 additional minutes.   While looking in a mirror, press and release button in center of patch.  A small green light will flash 3-4 times .  This will be your only indicator the monitor has been turned on.     Do not shower for the first 24 hours.  You may shower after the first 24 hours.   Press button if you feel a symptom. You will hear a small click.  Record Date, Time and Symptom in the Patient Log Book.   When you are ready to remove patch, follow instructions on last 2 pages of Patient Log Book.  Stick patch monitor onto last page of Patient Log Book.   Place Patient Log Book in [place] box.  Use locking tab on box and tape box closed securely.  The [employer] has [employer] on it.  Please place in mailbox as soon as possible.  Your physician should have your test results approximately 7 days after the monitor has been mailed back to [facility].   Call [facility] Customer Care at [phone] if you have questions regarding your ZIO XT patch monitor.  Call them immediately if  you see an orange light blinking on your monitor.   If your monitor falls off in less than 4 days contact our Monitor department at [phone].  If your monitor becomes loose or falls off after 4 days call [employer] at [phone] for suggestions on securing your monitor.     Follow-Up: At [facility], you and your health needs are our priority.  As part of our continuing mission to provide you with exceptional heart care, we have created designated Provider Care Teams.  These Care Teams include your primary Cardiologist (physician) and Advanced Practice Providers (APPs -  Physician Assistants and Nurse Practitioners) who all  work together to provide you with the care you need, when you need it.  We recommend signing up for the patient portal called MyChart.  Sign up information is provided on this After Visit Summary.  MyChart is used to connect with patients for Virtual Visits (Telemedicine).  Patients are able to view lab/test results, encounter notes, upcoming appointments, etc.  Non-urgent messages can be sent to your provider as well.   To learn more about what you can do with MyChart, go to [email or web address].    Your next appointment:   3 month(s)  Provider:   Callie Goodrich, PA-C          Other Instructions Thank you for choosing [facility]!

## 2024-03-03 NOTE — Progress Notes (Unsigned)
 Applied a 14 day Zio XT monitor to patient in the office  [place] to read

## 2024-03-04 ENCOUNTER — Ambulatory Visit: Payer: Self-pay | Admitting: Student

## 2024-03-04 ENCOUNTER — Inpatient Hospital Stay: Admitting: Nurse Practitioner

## 2024-03-04 ENCOUNTER — Telehealth: Payer: Self-pay | Admitting: Internal Medicine

## 2024-03-04 ENCOUNTER — Inpatient Hospital Stay: Admitting: Internal Medicine

## 2024-03-04 DIAGNOSIS — Z79899 Other long term (current) drug therapy: Secondary | ICD-10-CM

## 2024-03-04 LAB — BASIC METABOLIC PANEL WITH GFR
BUN/Creatinine Ratio: 17 (ref 9–23)
BUN: 21 mg/dL (ref 6–24)
CO2: 22 mmol/L (ref 20–29)
Calcium: 9.2 mg/dL (ref 8.7–10.2)
Chloride: 109 mmol/L — ABNORMAL HIGH (ref 96–106)
Creatinine, Ser: 1.23 mg/dL — ABNORMAL HIGH (ref 0.57–1.00)
Glucose: 83 mg/dL (ref 70–99)
Potassium: 5.5 mmol/L — ABNORMAL HIGH (ref 3.5–5.2)
Sodium: 142 mmol/L (ref 134–144)
eGFR: 55 mL/min/1.73 — ABNORMAL LOW (ref >59)

## 2024-03-04 LAB — MAGNESIUM: Magnesium: 2.2 mg/dL (ref 1.6–2.3)

## 2024-03-04 NOTE — Telephone Encounter (Signed)
 Called patient, Patient appointment has been rescheduled, a Transportation voucher has also been completed.    Transportation Voucher completed.   Pick up date and time: 03/28/2024 2:00 pm.  Pick up address: [address] [place] [address]   Drop off address:  [address] [place] [address] [address]

## 2024-03-04 NOTE — Progress Notes (Signed)
 Spoke to patient regarding kidney. No more concerns or questions at this time regarding kidney. She is aware of the repeat blood draw for next week.

## 2024-03-04 NOTE — Telephone Encounter (Signed)
 Copied from CRM #[id number]. Topic: Appointments - Appointment Info/Confirmation >> Mar 04, 2024  9:07 AM Montie POUR wrote:  Patient/patient representative is calling for information regarding an appointment.  Ms Chirico has an appointment today for a hospital follow up and her transportation did not show up. No appointment is available until mid September. Please call her at [phone] to reschedule for an earlier appointment.

## 2024-03-21 ENCOUNTER — Ambulatory Visit: Admitting: Surgical

## 2024-03-25 ENCOUNTER — Other Ambulatory Visit: Payer: Self-pay | Admitting: Internal Medicine

## 2024-03-25 ENCOUNTER — Other Ambulatory Visit (HOSPITAL_COMMUNITY): Payer: Self-pay

## 2024-03-25 DIAGNOSIS — G8929 Other chronic pain: Secondary | ICD-10-CM

## 2024-03-25 DIAGNOSIS — I10 Essential (primary) hypertension: Secondary | ICD-10-CM

## 2024-03-25 DIAGNOSIS — M79641 Pain in right hand: Secondary | ICD-10-CM

## 2024-03-25 MED ORDER — DULOXETINE HCL 20 MG PO CPEP
20.0000 mg | ORAL_CAPSULE | Freq: Every day | ORAL | 0 refills | Status: DC
  Filled 2024-03-25: qty 30, 30d supply, fill #0

## 2024-03-25 MED ORDER — AMLODIPINE BESYLATE 10 MG PO TABS
10.0000 mg | ORAL_TABLET | Freq: Every day | ORAL | 0 refills | Status: DC
  Filled 2024-03-25: qty 30, 30d supply, fill #0

## 2024-03-26 ENCOUNTER — Other Ambulatory Visit (HOSPITAL_COMMUNITY): Payer: Self-pay

## 2024-03-26 DIAGNOSIS — R002 Palpitations: Secondary | ICD-10-CM | POA: Diagnosis not present

## 2024-03-28 ENCOUNTER — Inpatient Hospital Stay: Admitting: Nurse Practitioner

## 2024-03-30 ENCOUNTER — Encounter: Payer: Self-pay | Admitting: Surgical

## 2024-04-02 ENCOUNTER — Ambulatory Visit: Admitting: Physician Assistant

## 2024-04-04 NOTE — Telephone Encounter (Signed)
 Spoke with patient regarding coming in for lab draw. Says she may be able to come in today

## 2024-04-14 ENCOUNTER — Ambulatory Visit: Admitting: Surgical

## 2024-04-14 ENCOUNTER — Telehealth: Payer: Self-pay

## 2024-04-14 NOTE — Telephone Encounter (Signed)
 Copied from CRM #[id number]. Topic: General - Other >> Apr 14, 2024 10:10 AM Dedra B wrote: Reason for CRM: Rock from [facility] called to follow up on faxed request for office notes. She will resend the fax and follow up later on in the week. Rock can be reached at [phone].

## 2024-04-15 DIAGNOSIS — R079 Chest pain, unspecified: Secondary | ICD-10-CM | POA: Diagnosis not present

## 2024-04-15 DIAGNOSIS — I10 Essential (primary) hypertension: Secondary | ICD-10-CM | POA: Diagnosis not present

## 2024-04-15 DIAGNOSIS — E785 Hyperlipidemia, unspecified: Secondary | ICD-10-CM | POA: Diagnosis not present

## 2024-04-15 DIAGNOSIS — I4729 Other ventricular tachycardia: Secondary | ICD-10-CM | POA: Diagnosis not present

## 2024-04-15 DIAGNOSIS — R002 Palpitations: Secondary | ICD-10-CM | POA: Diagnosis not present

## 2024-04-15 DIAGNOSIS — F1910 Other psychoactive substance abuse, uncomplicated: Secondary | ICD-10-CM | POA: Diagnosis not present

## 2024-04-15 DIAGNOSIS — I252 Old myocardial infarction: Secondary | ICD-10-CM | POA: Diagnosis not present

## 2024-04-15 DIAGNOSIS — N183 Chronic kidney disease, stage 3 unspecified: Secondary | ICD-10-CM | POA: Diagnosis not present

## 2024-04-15 NOTE — Telephone Encounter (Signed)
 Previous fax form received. Patient had not been seen in over 6 months. Awaiting until follow-up appointment on 05/01/2024 for updated office visit notes.

## 2024-04-16 ENCOUNTER — Other Ambulatory Visit: Payer: Self-pay | Admitting: Internal Medicine

## 2024-04-16 DIAGNOSIS — M62838 Other muscle spasm: Secondary | ICD-10-CM

## 2024-04-16 DIAGNOSIS — M79641 Pain in right hand: Secondary | ICD-10-CM

## 2024-04-16 DIAGNOSIS — G8929 Other chronic pain: Secondary | ICD-10-CM

## 2024-04-16 DIAGNOSIS — R1030 Lower abdominal pain, unspecified: Secondary | ICD-10-CM

## 2024-04-16 DIAGNOSIS — G629 Polyneuropathy, unspecified: Secondary | ICD-10-CM

## 2024-04-16 DIAGNOSIS — A048 Other specified bacterial intestinal infections: Secondary | ICD-10-CM

## 2024-04-16 DIAGNOSIS — I10 Essential (primary) hypertension: Secondary | ICD-10-CM

## 2024-04-16 LAB — HEPATIC FUNCTION PANEL
ALT: 16 IU/L (ref 0–32)
AST: 15 IU/L (ref 0–40)
Albumin: 3.7 g/dL — ABNORMAL LOW (ref 3.9–4.9)
Alkaline Phosphatase: 79 IU/L (ref 41–116)
Bilirubin Total: 0.3 mg/dL (ref 0.0–1.2)
Bilirubin, Direct: 0.09 mg/dL (ref 0.00–0.40)
Total Protein: 5.8 g/dL — ABNORMAL LOW (ref 6.0–8.5)

## 2024-04-16 LAB — BASIC METABOLIC PANEL WITH GFR
BUN/Creatinine Ratio: 12 (ref 9–23)
BUN: 14 mg/dL (ref 6–24)
CO2: 22 mmol/L (ref 20–29)
Calcium: 8.9 mg/dL (ref 8.7–10.2)
Chloride: 109 mmol/L — ABNORMAL HIGH (ref 96–106)
Creatinine, Ser: 1.18 mg/dL — ABNORMAL HIGH (ref 0.57–1.00)
Glucose: 89 mg/dL (ref 70–99)
Potassium: 5.1 mmol/L (ref 3.5–5.2)
Sodium: 144 mmol/L (ref 134–144)
eGFR: 57 mL/min/1.73 — ABNORMAL LOW (ref >59)

## 2024-04-16 LAB — LIPID PANEL
Chol/HDL Ratio: 3.5 ratio (ref 0.0–4.4)
Cholesterol, Total: 186 mg/dL (ref 100–199)
HDL: 53 mg/dL (ref >39)
LDL Chol Calc (NIH): 119 mg/dL — ABNORMAL HIGH (ref 0–99)
Triglycerides: 76 mg/dL (ref 0–149)
VLDL Cholesterol Cal: 14 mg/dL (ref 5–40)

## 2024-04-16 NOTE — Telephone Encounter (Signed)
 Copied from CRM [id number]. Topic: Clinical - Medication Refill >> Apr 16, 2024 10:48 AM Tiffini S wrote: Medication: carvedilol  (COREG ) 25 MG tablet, atorvastatin  (LIPITOR ) 80 MG tablet irbesartan  (AVAPRO ) 150 MG tablet, isosorbide  mononitrate (IMDUR ) 60 MG 24 hr tablet, clopidogrel  (PLAVIX ) 75 MG tablet,  acetaminophen -codeine  (TYLENOL  #3) 300-30 MG tablet, amLODipine  (NORVASC ) 10 MG tablet aspirin  EC 81 MG tablet, DULoxetine  (CYMBALTA ) 20 MG capsule cyclobenzaprine  (FLEXERIL ) 10 MG tablet,   gabapentin  (NEURONTIN ) 300 MG capsule,  pantoprazole  (PROTONIX ) 40 MG tablet  Has the patient contacted their pharmacy? Yes (Agent: If no, request that the patient contact the pharmacy for the refill. If patient does not wish to contact the pharmacy document the reason why and proceed with request.) (Agent: If yes, when and what did the pharmacy advise?)  This is the patient's preferred pharmacy:  [facility] - [place], [place] - [address] [place] [address] Phone: [phone] Fax: [phone]  Is this the correct pharmacy for this prescription? Yes If no, delete pharmacy and type the correct one.   Has the prescription been filled recently? Yes  Is the patient out of the medication? Yes  Has the patient been seen for an appointment in the last year OR does the patient have an upcoming appointment? Yes  Can we respond through MyChart? Yes  Agent: Please be advised that Rx refills may take up to 3 business days. We ask that you follow-up with your pharmacy.

## 2024-04-18 NOTE — Telephone Encounter (Signed)
 Requested medication (s) are due for refill today: routing for review  Requested medication (s) are on the active medication list: yes  Last refill:  01/29/24  Future visit scheduled: yes  Notes to clinic:  Unable to refill per protocol, last refill by another provider.      Requested Prescriptions  Pending Prescriptions Disp Refills   carvedilol  (COREG ) 25 MG tablet 60 tablet 0    Sig: Take 1 tablet (25 mg total) by mouth 2 (two) times daily with a meal.     Cardiovascular: Beta Blockers 3 Failed - 04/18/2024  9:18 AM      Failed - Cr in normal range and within 360 days    Creatinine, Ser  Date Value Ref Range Status  04/15/2024 1.18 (H) 0.57 - 1.00 mg/dL Final         Failed - Valid encounter within last 6 months    Recent Outpatient Visits           7 months ago Gas pain   [facility] - [facility] Dept Of [place]. [facility] [place], [place], [place]   1 year ago Neuropathy   [facility] - A Dept Of [place]. [facility] [place], [place], [place]   1 year ago Patient left before evaluation by physician   [facility] - A Dept Of F[place]. [facility] Vicci Barnie NOVAK, MD   1 year ago Essential hypertension   [facility] - A Dept Of [place]. [facility] Vicci Barnie NOVAK, MD   2 years ago Pre-operative clearance   [facility] - A Dept Of [place]. [facility] Vicci Barnie NOVAK, MD       Future Appointments             In 2 weeks Magnant, Carlin CROME, PA-C [facility]   In 1 month Goodrich, Callie E, PA-C [facility] [employer]. [facility], [facility]            Passed - AST in normal range and within 360 days    AST  Date Value Ref Range Status  04/15/2024 15 0 - 40 IU/L Final         Passed - ALT in normal range and within 360 days    ALT  Date Value Ref Range Status   04/15/2024 16 0 - 32 IU/L Final         Passed - Last BP in normal range    BP Readings from Last 1 Encounters:  03/03/24 110/78         Passed - Last Heart Rate in normal range    Pulse Readings from Last 1 Encounters:  03/03/24 84          atorvastatin  (LIPITOR ) 80 MG tablet 90 tablet 0    Sig: Take 1 tablet (80 mg total) by mouth daily.     Cardiovascular:  Antilipid - Statins Failed - 04/18/2024  9:18 AM      Failed - Lipid Panel in normal range within the last 12 months    Cholesterol, Total  Date Value Ref Range Status  04/15/2024 186 100 - 199 mg/dL Final   LDL Chol Calc (NIH)  Date Value Ref Range Status  04/15/2024 119 (H) 0 - 99 mg/dL Final   HDL  Date Value Ref  Range Status  04/15/2024 53 >39 mg/dL Final   Triglycerides  Date Value Ref Range Status  04/15/2024 76 0 - 149 mg/dL Final         Passed - Patient is not pregnant      Passed - Valid encounter within last 12 months    Recent Outpatient Visits           7 months ago Gas pain   [facility] - [facility] Dept Of [place]. [facility] [place], [place], [place]   1 year ago Neuropathy   [facility] [place] - A Dept Of [place]. [facility] [place], [place], [place]   1 year ago Patient left before evaluation by physician   [facility] - A Dept Of [place]. [facility] Vicci Barnie NOVAK, MD   1 year ago Essential hypertension   [facility] [place] - A Dept Of [place]. [facility] Vicci Barnie NOVAK, MD   2 years ago Pre-operative clearance   [facility] - A Dept Of [place]. [facility] Vicci Barnie NOVAK, MD       Future Appointments             In 2 weeks Magnant, Carlin CROME, PA-C [place] [facility] [facility] [place] [facility]   In 1 month Goodrich, Callie E, PA-C [facility] [employer]. [facility], [facility]             irbesartan  (AVAPRO ) 150 MG tablet 30  tablet 0    Sig: Take 1 tablet (150 mg total) by mouth daily.     Cardiovascular:  Angiotensin Receptor Blockers Failed - 04/18/2024  9:18 AM      Failed - Cr in normal range and within 180 days    Creatinine, Ser  Date Value Ref Range Status  04/15/2024 1.18 (H) 0.57 - 1.00 mg/dL Final         Failed - Valid encounter within last 6 months    Recent Outpatient Visits           7 months ago Gas pain   [facility] - A Dept Of Chlor[place]. [facility] [place], [place], [place]   1 year ago Neuropathy   [facility] - A Dept Of [place]. [facility] [place], [place], [place]   1 year ago Patient left before evaluation by physician   [facility] - A Dept Of [place]. [facility] Vicci Barnie NOVAK, MD   1 year ago Essential hypertension   [facility] [place] - A Dept Of [place]. [facility] Vicci Barnie NOVAK, MD   2 years ago Pre-operative clearance   [facility] [place] - A Dept Of . [facility] Vicci Barnie NOVAK, MD       Future Appointments             In 2 weeks Magnant, Carlin CROME, PA-C  [facility]   In 1 month Goodrich, Callie E, PA-C [facility] [employer] [facility] [employer]. [facility], [facility]            Passed - K in normal range and within 180 days    Potassium  Date Value Ref Range Status  04/15/2024 5.1  3.5 - 5.2 mmol/L Final         Passed - Patient is not pregnant      Passed - Last BP in normal range    BP Readings from Last 1 Encounters:  03/03/24 110/78          isosorbide  mononitrate (IMDUR ) 60 MG 24 hr tablet 60 tablet 0    Sig: Take 1 tablet (60 mg total) by mouth 2 (two) times daily at 10 am and 4 pm.     Cardiovascular:  Nitrates Passed - 04/18/2024  9:18 AM      Passed - Last BP in normal range    BP Readings from Last 1 Encounters:  03/03/24 110/78         Passed - Last Heart  Rate in normal range    Pulse Readings from Last 1 Encounters:  03/03/24 84         Passed - Valid encounter within last 12 months    Recent Outpatient Visits           7 months ago Gas pain   [facility] - A Dept Of [place]. [facility] [place], [place], [place]   1 year ago Neuropathy   [facility] - A Dept Of [place]. [facility] [place], [place], [place]   1 year ago Patient left before evaluation by physician   [facility] [facility] - A Dept Of [place]. [facility] Vicci Barnie NOVAK, MD   1 year ago Essential hypertension   [place] [facility] [place] - A Dept Of [place]. [facility] Vicci Barnie NOVAK, MD   2 years ago Pre-operative clearance   [facility] [facility] - A Dept Of . [facility] Vicci Barnie NOVAK, MD       Future Appointments             In 2 weeks Magnant, Carlin CROME, PA-C [facility]   In 1 month Goodrich, Callie E, PA-C [facility] [employer] [facility] [employer]. [facility], [facility]             clopidogrel  (PLAVIX ) 75 MG tablet 90 tablet 0    Sig: Take 1 tablet (75 mg total) by mouth daily.     Hematology: Antiplatelets - clopidogrel  Failed - 04/18/2024  9:18 AM      Failed - HCT in normal range and within 180 days    HCT  Date Value Ref Range Status  01/29/2024 30.5 (L) 36.0 - 46.0 % Final   Hematocrit  Date Value Ref Range Status  11/09/2021 38.5 34.0 - 46.6 % Final         Failed - HGB in normal range and within 180 days    Hemoglobin  Date Value Ref Range Status  01/29/2024 9.9 (L) 12.0 - 15.0 g/dL Final  94/96/7976 86.4 11.1 - 15.9 g/dL Final         Failed - Cr in normal range and within 360 days    Creatinine, Ser  Date Value Ref Range Status  04/15/2024 1.18 (H) 0.57 - 1.00 mg/dL Final         Failed - Valid encounter within last 6 months    Recent Outpatient Visits           7  months ago Gas pain   [facility] -  A Dept Of [place]. [facility] [place], Jon [place], [place]   1 year ago Neuropathy   [place] [facility] C[facility] [place] - A Dept Of [place]. [facility] [place], Jon [place], [place]   1 year ago Patient left before evaluation by physician   [facility] [facility] - A Dept Of [place]. [facility] Vicci Barnie NOVAK, MD   1 year ago Essential hypertension   [place] [facility] [place] - A Dept Of [place]. [facility] Vicci Barnie NOVAK, MD   2 years ago Pre-operative clearance   [facility] [place] - A Dept Of [place]. [facility] Vicci Barnie NOVAK, MD       Future Appointments             In 2 weeks Magnant, Carlin CROME, PA-C [place] [facility]   In 1 month Goodrich, Callie E, PA-C [facility] [employer]. [facility]            Passed - PLT in normal range and within 180 days    Platelets  Date Value Ref Range Status  01/29/2024 197 150 - 400 K/uL Final  11/09/2021 275 150 - 450 x10E3/uL Final          acetaminophen -codeine  (TYLENOL  #3) 300-30 MG tablet 14 tablet 0    Sig: Take 1 tablet by mouth at bedtime as needed for moderate pain (pain score 4-6).     Not Delegated - Analgesics:  Opioid Agonist Combinations 2 Failed - 04/18/2024  9:18 AM      Failed - This refill cannot be delegated      Failed - Cr in normal range and within 360 days    Creatinine, Ser  Date Value Ref Range Status  04/15/2024 1.18 (H) 0.57 - 1.00 mg/dL Final         Failed - Valid encounter within last 3 months    Recent Outpatient Visits           7 months ago Gas pain   [facility] [facility] [place] [place] - A Dept Of [place]. [facility] [place], [place] HER[place], [place]   1 year ago Neuropathy   [facility] [place] - A Dept Of [place]. [facility] [place], [place], [place]   1 year ago Patient left before  evaluation by physician   [facility] [facility] - A Dept Of [place]. [facility] Vicci Barnie NOVAK, MD   1 year ago Essential hypertension   [facility] G[facility] [place] - A Dept Of [place]. [facility] Vicci Barnie NOVAK, MD   2 years ago Pre-operative clearance   [facility] [place] - A Dept Of [place]. [facility] Vicci Barnie NOVAK, MD       Future Appointments             In 2 weeks Magnant, Carlin CROME, PA-C [facility] [place]   In 1 month Goodrich, Callie E, PA-C [facility] [employer]. [facility], [facility]            Passed - eGFR is 10 or above and within 360 days    GFR calc Af Amer  Date Value Ref Range Status  08/08/2016 >60 >60 mL/min Final    Comment:    (  NOTE) The eGFR has been calculated using the CKD EPI equation. This calculation has not been validated in all clinical situations. eGFR's persistently <60 mL/min signify possible Chronic Kidney Disease.    GFR, Estimated  Date Value Ref Range Status  01/29/2024 59 (L) >60 mL/min Final    Comment:    (NOTE) Calculated using the CKD-EPI Creatinine Equation (2021)    eGFR  Date Value Ref Range Status  04/15/2024 57 (L) >59 mL/min/1.73 Final         Passed - Urine Drug Screen completed in last 360 days      Passed - Patient is not pregnant       amLODipine  (NORVASC ) 10 MG tablet 30 tablet 0    Sig: Take 1 tablet (10 mg total) by mouth daily.     Cardiovascular: Calcium  Channel Blockers 2 Failed - 04/18/2024  9:18 AM      Failed - Valid encounter within last 6 months    Recent Outpatient Visits           7 months ago Gas pain   [facility] [facility] - A Dept Of [place]. [facility] [place], [place], [place]   1 year ago Neuropathy   [place] [facility] - A Dept Of [place]. [facility] [place], [place], [place]   1 year ago Patient left before evaluation by physician    [place] [facility] [facility] - A Dept Of [place]. [facility] Vicci Barnie NOVAK, MD   1 year ago Essential hypertension   [facility] [place] - A Dept Of [place]. [facility] Vicci Barnie NOVAK, MD   2 years ago Pre-operative clearance   [place] [facility] - A Dept Of [place]. [facility] Vicci Barnie NOVAK, MD       Future Appointments             In 2 weeks Magnant, Carlin CROME, PA-C [place] [facility]   In 1 month Goodrich, Callie E, PA-C [facility] [employer] [facility] [employer]. [facility]            Passed - Last BP in normal range    BP Readings from Last 1 Encounters:  03/03/24 110/78         Passed - Last Heart Rate in normal range    Pulse Readings from Last 1 Encounters:  03/03/24 84          aspirin  EC 81 MG tablet 90 tablet 0    Sig: Take 1 tablet (81 mg total) by mouth daily. Swallow whole.     Analgesics:  NSAIDS - aspirin  Failed - 04/18/2024  9:18 AM      Failed - Cr in normal range and within 360 days    Creatinine, Ser  Date Value Ref Range Status  04/15/2024 1.18 (H) 0.57 - 1.00 mg/dL Final         Passed - eGFR is 10 or above and within 360 days    GFR calc Af Amer  Date Value Ref Range Status  08/08/2016 >60 >60 mL/min Final    Comment:    (NOTE) The eGFR has been calculated using the CKD EPI equation. This calculation has not been validated in all clinical situations. eGFR's persistently <60 mL/min signify possible Chronic Kidney Disease.    GFR, Estimated  Date Value Ref Range Status  01/29/2024  59 (L) >60 mL/min Final    Comment:    (NOTE) Calculated using the CKD-EPI Creatinine Equation (2021)    eGFR  Date Value Ref Range Status  04/15/2024 57 (L) >59 mL/min/1.73 Final         Passed - Patient is not pregnant      Passed - Valid encounter within last 12 months    Recent Outpatient Visits           7 months ago Gas pain   [place]  [facility] [place] - A Dept Of [place]. [facility] [place], Jon [place], [place]   1 year ago Neuropathy   [place] [facility] [place] - A Dept Of [place]. [facility] [place], Jon [place], [place]   1 year ago Patient left before evaluation by physician   [facility] [facility] - A Dept Of [place]. [facility] Vicci Barnie NOVAK, MD   1 year ago Essential hypertension   [place] [facility] [place] - A Dept Of [place]. [facility] Vicci Barnie NOVAK, MD   2 years ago Pre-operative clearance   [facility] [place] - A Dept Of [place]. [facility] Vicci Barnie NOVAK, MD       Future Appointments             In 2 weeks Magnant, Carlin CROME, PA-C [facility] [place]   In 1 month Goodrich, Callie E, PA-C [facility] [employer]. [facility]             DULoxetine  (CYMBALTA ) 20 MG capsule 30 capsule 0    Sig: Take 1 capsule (20 mg total) by mouth daily.     Psychiatry: Antidepressants - SNRI - duloxetine  Failed - 04/18/2024  9:18 AM      Failed - Cr in normal range and within 360 days    Creatinine, Ser  Date Value Ref Range Status  04/15/2024 1.18 (H) 0.57 - 1.00 mg/dL Final         Failed - Valid encounter within last 6 months    Recent Outpatient Visits           7 months ago Gas pain   [place] [facility] [place] - A Dept Of [place]. [facility] [place], [place] HER[place], [place]   1 year ago Neuropathy   [place] [facility] [place] - A Dept Of [place]. [facility] [place], Jon [place], [place]   1 year ago Patient left before evaluation by physician   [facility] [facility] Shel[facility] - A Dept Of [place]. [facility] Vicci Barnie NOVAK, MD   1 year ago Essential hypertension   [place] [facility] [place] - A Dept Of [place]. [facility] Vicci Barnie NOVAK, MD   2 years ago Pre-operative clearance   [facility]  [place] - A Dept Of . [facility] Vicci Barnie NOVAK, MD       Future Appointments             In 2 weeks Magnant, Carlin CROME, PA-C  [facility] [place]   In 1 month Goodrich, Callie E, PA-C [facility] [employer]. [facility], [facility]            Passed - eGFR is 30 or above and within  360 days    GFR calc Af Amer  Date Value Ref Range Status  08/08/2016 >60 >60 mL/min Final    Comment:    (NOTE) The eGFR has been calculated using the CKD EPI equation. This calculation has not been validated in all clinical situations. eGFR's persistently <60 mL/min signify possible Chronic Kidney Disease.    GFR, Estimated  Date Value Ref Range Status  01/29/2024 59 (L) >60 mL/min Final    Comment:    (NOTE) Calculated using the CKD-EPI Creatinine Equation (2021)    eGFR  Date Value Ref Range Status  04/15/2024 57 (L) >59 mL/min/1.73 Final         Passed - Completed PHQ-2 or PHQ-9 in the last 360 days      Passed - Last BP in normal range    BP Readings from Last 1 Encounters:  03/03/24 110/78          cyclobenzaprine  (FLEXERIL ) 10 MG tablet 40 tablet 0     Not Delegated - Analgesics:  Muscle Relaxants Failed - 04/18/2024  9:18 AM      Failed - This refill cannot be delegated      Failed - Valid encounter within last 6 months    Recent Outpatient Visits           7 months ago Gas pain   [place] [facility] [place] - A Dept Of [place]. [facility] [place], Jon HERO, [place]   1 year ago Neuropathy   [facility] [facility] [place] - A Dept Of [place]. [facility] [place], [place] HERO, [place]   1 year ago Patient left before evaluation by physician   [place] [facility] - A Dept Of [place]. [facility] Vicci Barnie NOVAK, MD   1 year ago Essential hypertension   [facility] [facility] [place] - A Dept Of [place]. [place]. [facility] Vicci Barnie NOVAK, MD   2 years ago  Pre-operative clearance   [facility] [place] - A Dept Of Barn[place]. [facility] Vicci Barnie NOVAK, MD       Future Appointments             In 2 weeks Magnant, Carlin CROME, PA-C [place] [facility] [place]   In 1 month Goodrich, Callie E, PA-C [facility] [employer]. [facility], [facility]             gabapentin  (NEURONTIN ) 300 MG capsule 90 capsule 0    Sig: Take 1 capsule (300 mg total) by mouth at bedtime. Needs to be seen prior to next refill request.     Neurology: Anticonvulsants - gabapentin  Failed - 04/18/2024  9:18 AM      Failed - Cr in normal range and within 360 days    Creatinine, Ser  Date Value Ref Range Status  04/15/2024 1.18 (H) 0.57 - 1.00 mg/dL Final         Passed - Completed PHQ-2 or PHQ-9 in the last 360 days      Passed - Valid encounter within last 12 months    Recent Outpatient Visits           7 months ago Gas pain   [facility] [place] - A Dept Of Humphre[place]. [facility] [place], [place], [place]   1 year ago Neuropathy   [place] [facility] [place]ates - A Dept Of  [place]. [facility] [place], [place], [place]   1 year ago Patient left before evaluation by physician   [facility] [facility] Shel[facility] - A Dept Of [place]. [facility] Vicci Barnie NOVAK, MD   1 year ago Essential hypertension   [place] [facility] [place] - A Dept Of [place]. [facility] Vicci Barnie NOVAK, MD   2 years ago Pre-operative clearance   [place] [facility] [place] - A Dept Of [place]. [facility] Vicci Barnie NOVAK, MD       Future Appointments             In 2 weeks Magnant, Carlin CROME, PA-C [place] [facility] [place]   In 1 month Goodrich, Callie E, PA-C [facility] [employer] [facility] [employer]. [facility], [facility]             pantoprazole  (PROTONIX ) 40 MG tablet 30 tablet 3     Gastroenterology: Proton Pump Inhibitors Passed -  04/18/2024  9:18 AM      Passed - Valid encounter within last 12 months    Recent Outpatient Visits           7 months ago Gas pain   [place] [facility] [place] - A Dept Of [place]. [facility] [place], Jon HERO, [place]   1 year ago Neuropathy   [place] [facility] [place] - A Dept Of [place]. [facility] [place], Jon HER[place], [place]   1 year ago Patient left before evaluation by physician   [place] [facility] Shel[facility] - A Dept Of [place]. [facility] Vicci Barnie NOVAK, MD   1 year ago Essential hypertension   [place] [facility] [place] - A Dept Of [place]. [facility] Vicci Barnie NOVAK, MD   2 years ago Pre-operative clearance   [place] [facility] [place] - A Dept Of Lakes of the North. [facility] Vicci Barnie NOVAK, MD       Future Appointments             In 2 weeks Magnant, Carlin CROME, PA-C  [facility] [place]   In 1 month Goodrich, Callie E, PA-C [facility] [employer]. [facility], H&V

## 2024-04-19 MED ORDER — DULOXETINE HCL 20 MG PO CPEP: 20.0000 mg | ORAL_CAPSULE | Freq: Every day | ORAL | 0 refills | Status: DC

## 2024-04-19 MED ORDER — CARVEDILOL 25 MG PO TABS: 25.0000 mg | ORAL_TABLET | Freq: Two times a day (BID) | ORAL | 0 refills | Status: DC

## 2024-04-19 MED ORDER — AMLODIPINE BESYLATE 10 MG PO TABS: 10.0000 mg | ORAL_TABLET | Freq: Every day | ORAL | 0 refills | Status: DC

## 2024-04-19 MED ORDER — ISOSORBIDE MONONITRATE ER 60 MG PO TB24: 60.0000 mg | ORAL_TABLET | Freq: Two times a day (BID) | ORAL | 0 refills | Status: AC

## 2024-04-19 MED ORDER — IRBESARTAN 150 MG PO TABS: 150.0000 mg | ORAL_TABLET | Freq: Every day | ORAL | 0 refills | Status: DC

## 2024-04-19 MED ORDER — CLOPIDOGREL BISULFATE 75 MG PO TABS: 75.0000 mg | ORAL_TABLET | Freq: Every day | ORAL | 0 refills | Status: DC

## 2024-04-19 MED ORDER — ASPIRIN 81 MG PO TBEC: 81.0000 mg | DELAYED_RELEASE_TABLET | Freq: Every day | ORAL | 0 refills | Status: DC

## 2024-04-19 MED ORDER — ATORVASTATIN CALCIUM 80 MG PO TABS: 80.0000 mg | ORAL_TABLET | Freq: Every day | ORAL | 0 refills | Status: DC

## 2024-04-19 MED ORDER — PANTOPRAZOLE SODIUM 40 MG PO TBEC: 40.0000 mg | DELAYED_RELEASE_TABLET | Freq: Every day | ORAL | 0 refills | Status: DC

## 2024-04-21 IMAGING — US US PELVIS COMPLETE WITH TRANSVAGINAL
1 series · 13 of 25 positions shown · non-contrast
Comparison: Pelvic ultrasound 03/06/2014, CT abdomen and pelvis
12/14/2021

CLINICAL DATA: Abnormal CT exam demonstrating LEFT adnexal mass;
postmenopausal, prior hysterectomy and RIGHT oophorectomy

EXAM:
TRANSABDOMINAL AND TRANSVAGINAL ULTRASOUND OF PELVIS
TECHNIQUE: Both transabdominal and transvaginal ultrasound examinations of the
pelvis were performed. Transabdominal technique was performed for
global imaging of the pelvis including uterus, ovaries, adnexal
regions, and pelvic cul-de-sac. It was necessary to proceed with
endovaginal exam following the transabdominal exam to visualize the
ovaries and adnexa.

[Series 1: us pelvis complete with transvaginal · 0.30mm/px · 45 acquisitions, 13 frames shown]
[im 1/45]
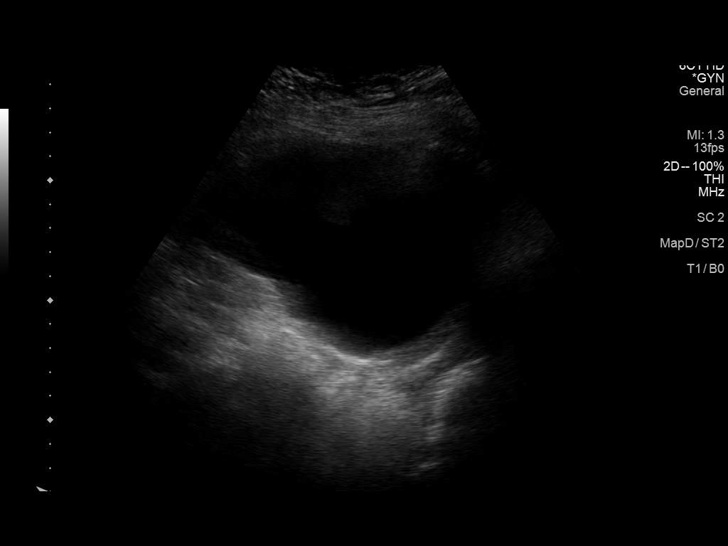
[im 4/45]
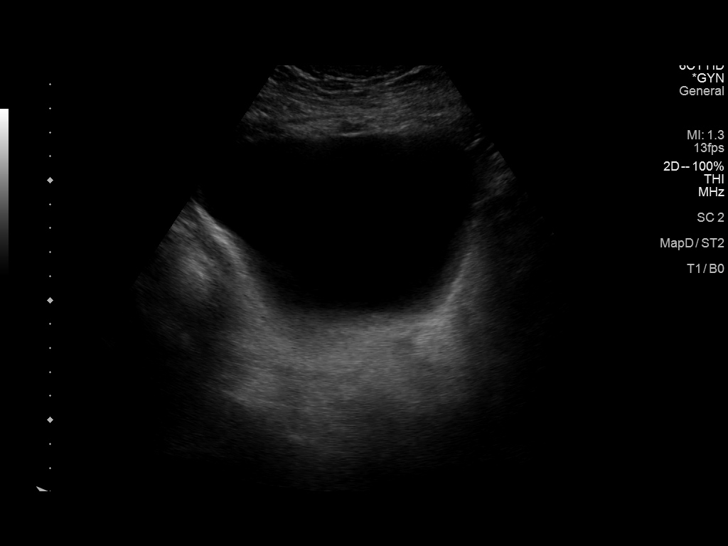
[im 8/45]
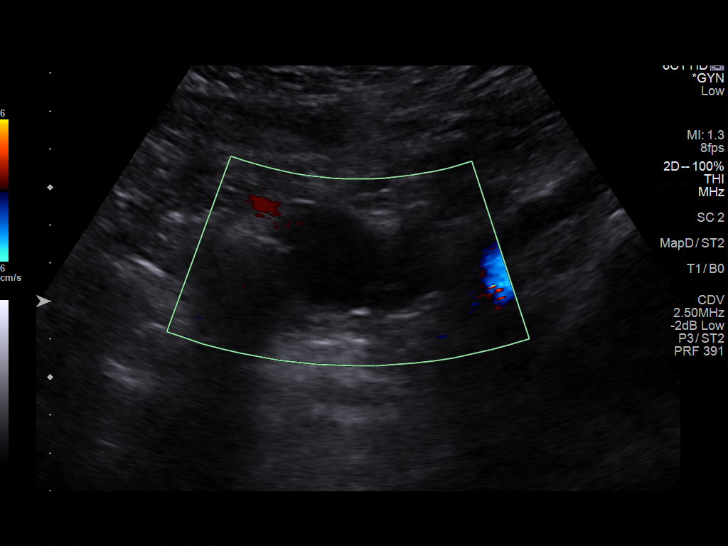
[im 12/45]
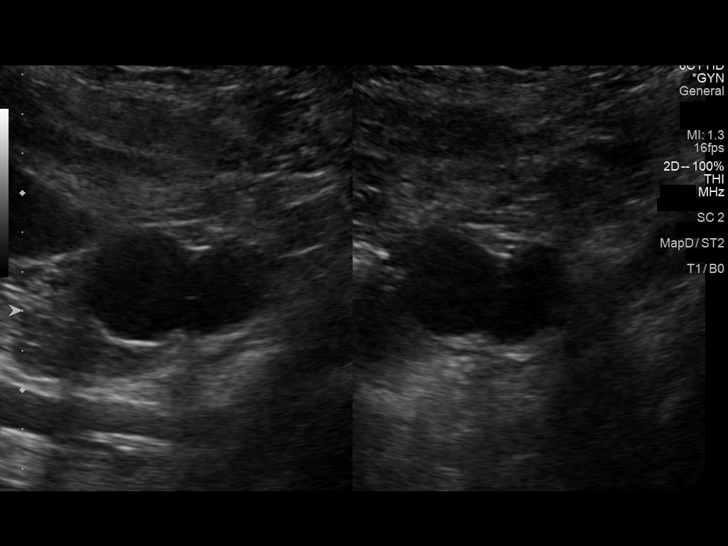
[im 15/45]
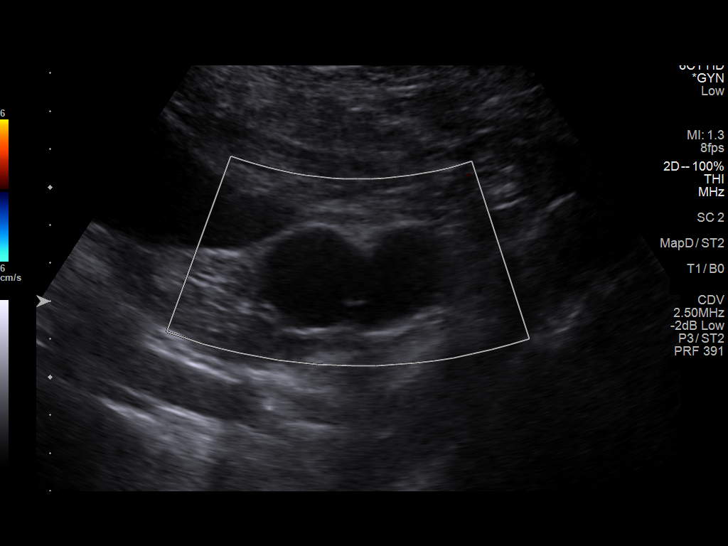
[im 19/45]
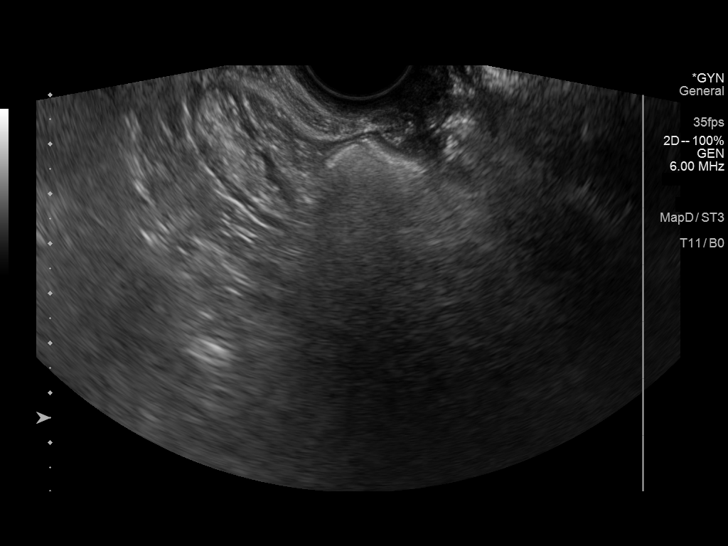
[im 23/45]
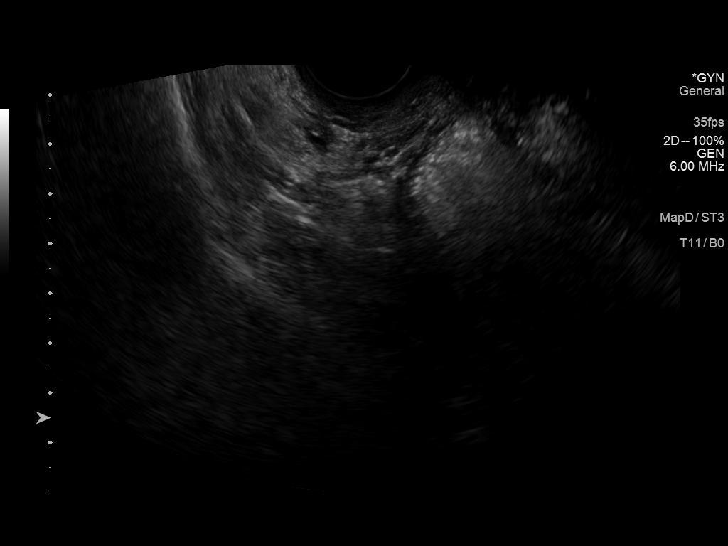
[im 26/45]
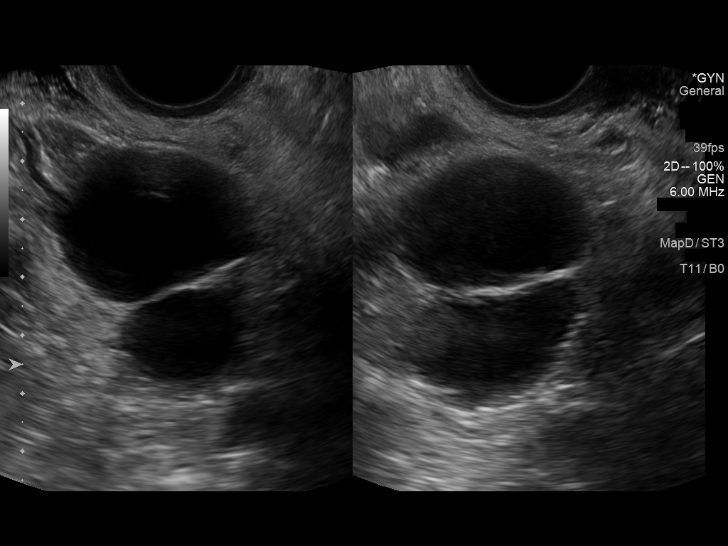
[im 30/45]
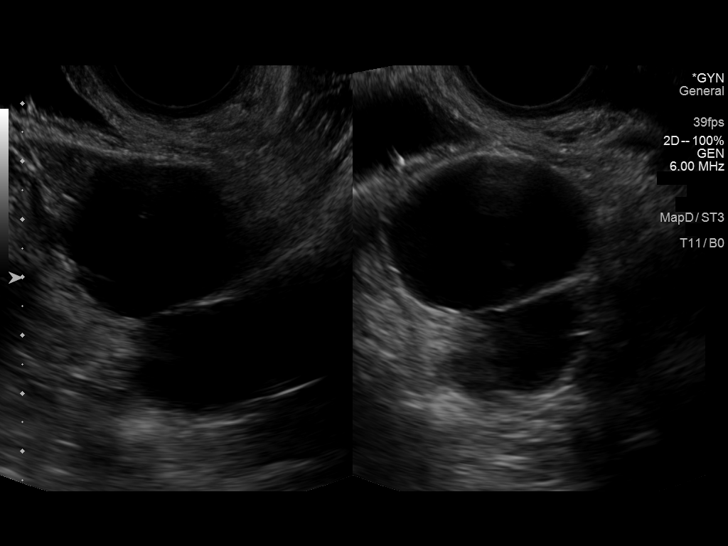
[im 34/45]
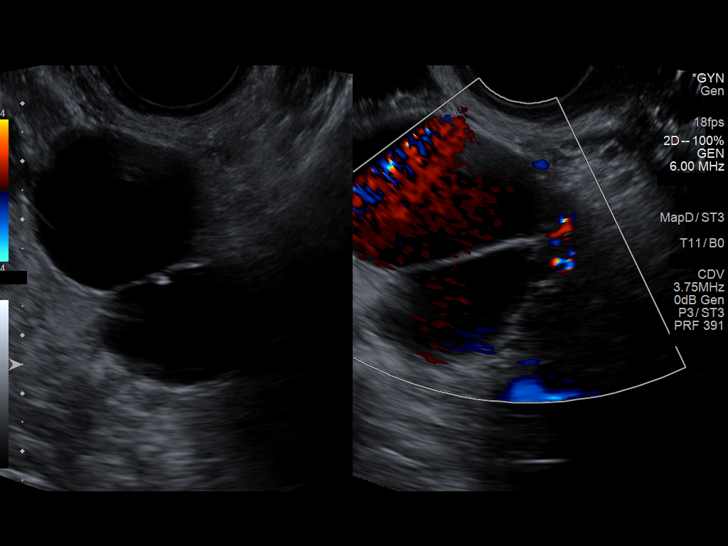
[im 37/45]
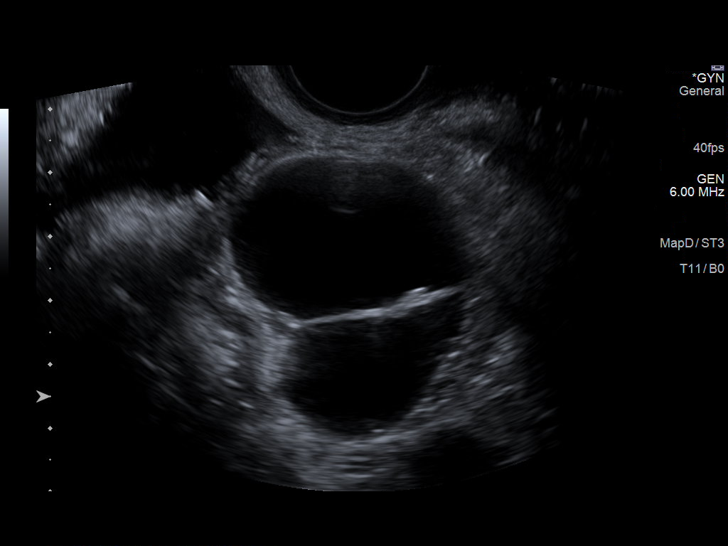
[im 41/45]
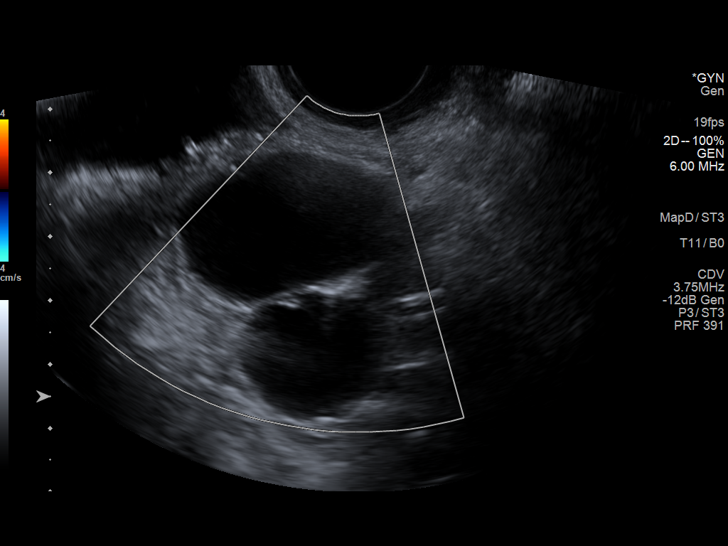
[im 45/45]
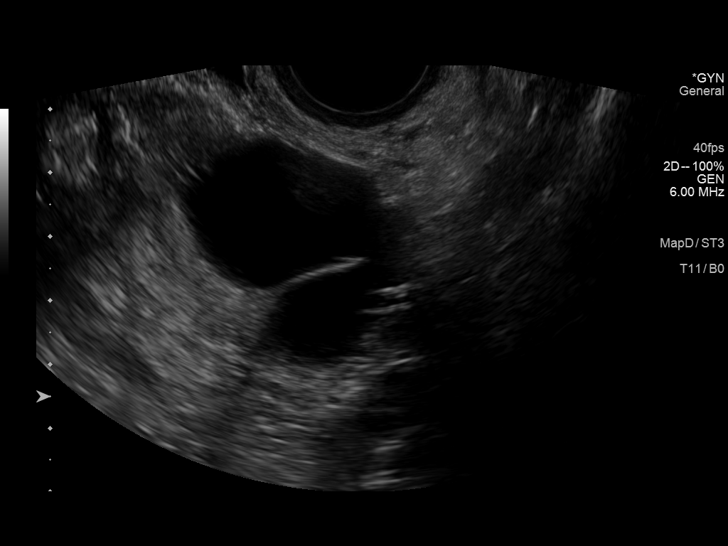

[13 of 25 positions shown; findings below may reference images not displayed]

FINDINGS: Uterus

Surgically absent

Endometrium

Surgically absent

Right ovary

Surgically absent

Left ovary

No normal appearing LEFT ovary visualized, see below

Other findings

No free pelvic fluid. Complicated cystic mass LEFT adnexa, 3.6 x
x 3.8 cm, appears to be of LEFT ovarian origin by CT though no
definite normal appearing LEFT ovarian tissue is identified. An
irregular mildly thickened septation with slight nodularity
traverses the lesion. A few scattered internal echoes within the
smaller cystic locule.
IMPRESSION: Surgical absence of uterus and RIGHT ovary.

Complicated cystic lesion LEFT adnexa, likely LEFT ovarian origin,
4.8 cm greatest size containing an irregular septation with slight
nodularity and few scattered internal echoes.

Lesion is worrisome for a cystic ovarian neoplasm and surgical
evaluation is recommended.

These results will be called to the ordering clinician or
representative by the Radiologist Assistant, and communication
documented in the PACS or [REDACTED].

## 2024-05-01 ENCOUNTER — Other Ambulatory Visit: Payer: Self-pay | Admitting: Internal Medicine

## 2024-05-01 ENCOUNTER — Encounter: Admitting: Internal Medicine

## 2024-05-03 ENCOUNTER — Ambulatory Visit

## 2024-05-03 DIAGNOSIS — Z23 Encounter for immunization: Secondary | ICD-10-CM

## 2024-05-08 ENCOUNTER — Ambulatory Visit: Admitting: Surgical

## 2024-05-12 ENCOUNTER — Other Ambulatory Visit: Payer: Self-pay | Admitting: Internal Medicine

## 2024-05-12 ENCOUNTER — Encounter: Payer: Self-pay | Admitting: Radiology

## 2024-05-12 DIAGNOSIS — M62838 Other muscle spasm: Secondary | ICD-10-CM

## 2024-05-12 DIAGNOSIS — G629 Polyneuropathy, unspecified: Secondary | ICD-10-CM

## 2024-05-12 DIAGNOSIS — R1030 Lower abdominal pain, unspecified: Secondary | ICD-10-CM

## 2024-05-13 NOTE — Progress Notes (Deleted)
 Cardiology Office Note:    Date:  05/13/2024   ID:  Lisa Haley, DOB 10/09/1975, MRN [id number]  PCP:  Vicci Barnie NOVAK, MD  Cardiologist:  Oneil Parchment, MD { Click to update primary MD,subspecialty MD or APP then REFRESH:1}    Referring MD: Vicci Barnie NOVAK, MD   Chief Complaint: follow-up of hypertension and palpitations   History of Present Illness:    Lisa Haley is a 48 y.o. 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 Parchment and presents today for routine follow-up.  Patient was admitted in 01/2024 for NSTEMI after presenting with chest pain in setting of cocaine use. BP was markedly elevated on arrival and was as high as the 220s. High-sensitivity as high as 1,570. Echo showed LVEF of 50-55% with severe hypokinesis and subtle dyskinesis of the apex (but no evidence of LV thrombus). LHC showed sluggish flow in all coronaries but no evidence of CAD. 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 Medical therapy with DAPT with Aspirin  and Plavix  was recommended for one year. She was also noted to have some NSVT during admission with longest run around 30 seconds.   She was last seen by me in 02/2024 at which time she reported 2 episodes of chest pain with activity since discharge but she had not had any chest pain in the last 2 weeks. During one of these episodes, BP was markedly elevated. She also reported palpitations that she described as heart racing. Plan was to up-titrate antianginals if she has recurrent chest pain. A 2 week Zio monitor was ordered for further evaluation of palpitations and showed 3 short runs of NSVT (longest run 4 beats) and one 16 beat run of SVT/ atrial tachycardia as  well as rare PACs/ PVCs.   Patient presents today for follow-up. ***  History of Cocaine Induced NSTEMI Patient was admitted in 01/2024 for NSTEMI after presenting with chest pain in setting of cocaine use. Echo showed LVEF of 50-55% with severe hypokinesis and subtle dyskinesis of the apex (but no evidence of LV thrombus). LHC showed sluggish flow in all coronaries but no evidence of CAD. 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 Medical therapy was recommended.  - *** - Continue antianginals: Amlodipine  10mg  daily, Coreg  25mg  twice daily, and Imdur  60mg  twice daily. - Continue DAPT with Aspirin  and Plavix  for 1 year.  - Continue Lipitor  80mg  daily.   Palpitations Non-Sustained VT Patient was note to have a long run of non-sustained VT on telemetry during recent admission with longest run approximately 30 seconds. Outpatient monitor in 02/2024 showed 3 short runs of NSVT (longest run 4 beats) and one 16 beat run of SVT/ atrial tachycardia as well as rare PACs/ PVCs.  - *** - Continue Coreg  25mg  twice daily.    Hypertension BP **** - BP well controlled. - Continue current medications: Amlodipine  10mg  daily, Coreg  25mg  twice daily, Imdur  60mg  twice daily, and Irbesartan  150mg  daily.    Hyperlipidemia Lipid panel in 04/2024: Total Cholesterol 186, Triglycerides 76, HDL 53, LDL 119.  - Currently on Lipitor  80mg  daily. Compliance? ***   CKD Stage III Baseline creatinine around 1.1 to 1.3.    Polysubstance Abuse She continue to smoke cigarettes but is trying to quit. Down to <1/2 pack per  day. However, she denies any recurrent cocaine use since recent admission. *** - Congratulated patient on progress made so far. Encouraged her to continue to work towards complete cessation of tobacco and emphasized the importance of continuing to avoid cocaine. ***   EKGs/Labs/Other Studies Reviewed:    The following studies were reviewed:  Echocardiogram 01/26/2024: Impressions: 1. There is no evidence  of left ventricular apical thrombus. There is a  limited area of severe hypokinesis and subtle dyskinesis of the apex,  generally suggesting a pattern of apical ballooning. Left ventricular  ejection fraction, by estimation, is 50  to 55%. The left ventricle has low normal function. The left ventricle  demonstrates regional wall motion abnormalities (see scoring  diagram/findings for description). There is mild concentric left  ventricular hypertrophy. Left ventricular diastolic  parameters are consistent with Grade I diastolic dysfunction (impaired  relaxation). There is severe hypokinesis of the left ventricular, entire  apical segment.   2. Right ventricular systolic function is normal. The right ventricular  size is normal. Tricuspid regurgitation signal is inadequate for assessing  PA pressure.   3. The mitral valve is normal in structure. No evidence of mitral valve  regurgitation. No evidence of mitral stenosis.   4. The aortic valve is tricuspid. Aortic valve regurgitation is not  visualized. No aortic stenosis is present.   5. The inferior vena cava is normal in size with greater than 50%  respiratory variability, suggesting right atrial pressure of 3 mmHg.   Comparison(s): No prior Echocardiogram. Findings suggest possible  takotsubo syndrome, but cannot exclude distal LAD artery distribution  ischemia/infarction.  _______________   Cardiac Catheterization 01/28/2024: LM: Normal LAD: No significant disease Lcx: No significant disease RCA: No significant disease   LVEDP 15 mmHg   No significant epicardial coronary artery disease Sluggish flow in all coronaries Suspect Cocaine induced NSTEMI Recommend DAPT with Aspirin  and Plavix  for 1 year for medical management of NSTEMI _______________  Geoffry Monitor 03/03/2024 to 03/12/2024: Patient had a min HR of 59 bpm, max HR of 250 bpm, and avg HR of 93 bpm. Predominant underlying rhythm was Sinus Rhythm. 3 Ventricular Tachycardia  runs occurred, the run with the fastest interval lasting 4 beats with a max rate of 250 bpm, the longest lasting 6 beats with an avg rate of 153 bpm. 1 run of Supraventricular Tachycardia occurred lasting 16 beats with a max rate of 128 bpm (avg 115 bpm). Isolated SVEs were rare (<1.0%), SVE Couplets were rare (<1.0%), and SVE Triplets were rare (<1.0%). Isolated VEs were rare (<1.0%, 2206), VE Couplets were rare (<1.0%, 25), and VE Triplets were rare (<1.0%, 8). Ventricular Bigeminy was present.     Sinus rhythm with average heart rate 93 bpm   Brief atrial tachycardia   Rare PVCs and PACs.   No atrial fibrillation, no pauses   EKG:  EKG not ordered today.   Recent Labs: 01/26/2024: TSH 0.927 01/29/2024: Hemoglobin 9.9; Platelets 197 03/03/2024: Magnesium  2.2 04/15/2024: ALT 16; BUN 14; Creatinine, Ser 1.18; Potassium 5.1; Sodium 144  Recent Lipid Panel    Component Value Date/Time   CHOL 186 04/15/2024 1315   TRIG 76 04/15/2024 1315   HDL 53 04/15/2024 1315   CHOLHDL 3.5 04/15/2024 1315   CHOLHDL 3.8 01/27/2024 0242   VLDL 20 01/27/2024 0242   LDLCALC 119 (H) 04/15/2024 1315    Physical Exam:    Vital Signs: There were no vitals taken for this visit.    Wt Readings from Last 3  Encounters:  03/03/24 252 lb (114.3 kg)  01/26/24 239 lb (108.4 kg)  09/12/23 239 lb (108.4 kg)     General: 48 y.o. female in no acute distress. HEENT: Normocephalic and atraumatic. Sclera clear.  Neck: Supple. No carotid bruits. No JVD. Heart: *** RRR. Distinct S1 and S2. No murmurs, gallops, or rubs.  Lungs: No increased work of breathing. Clear to ausculation bilaterally. No wheezes, rhonchi, or rales.  Abdomen: Soft, non-distended, and non-tender to palpation.  Extremities: No lower extremity edema.  Radial and distal pedal pulses 2+ and equal bilaterally. Skin: Warm and dry. Neuro: No focal deficits. Psych: Normal affect. Responds appropriately.   Assessment:    No diagnosis  found.  Plan:     Disposition: Follow up in ***   Signed, Aline FORBES Door, PA-C  05/13/2024 9:38 AM    [facility]

## 2024-05-15 ENCOUNTER — Ambulatory Visit: Admitting: Internal Medicine

## 2024-05-22 ENCOUNTER — Telehealth: Payer: Self-pay | Admitting: Internal Medicine

## 2024-05-22 NOTE — Telephone Encounter (Signed)
 Copied from CRM [id number]. Topic: General - Other >> May 21, 2024  5:55 PM Everette C wrote: Reason for CRM: Waddell with [facility] has called to follow up on previously submitted requests for office notes. Please contact Waddell further at [phone] when possible

## 2024-05-22 NOTE — Telephone Encounter (Signed)
 Called patient to schedule an appointment, unable to get in contact with patient and [facility]. Contact on file. Unable to complete call a this time I'll keep trying though.

## 2024-05-26 NOTE — Telephone Encounter (Signed)
 Called patient to schedule an appointment, unable to get in contact with patient.  Unable to complete call a this time.

## 2024-05-27 ENCOUNTER — Ambulatory Visit: Attending: Student | Admitting: Student

## 2024-05-27 NOTE — Telephone Encounter (Signed)
 Called patient to schedule an appointment, unable to get in contact with patient.  Unable to complete call a this time.

## 2024-06-21 ENCOUNTER — Other Ambulatory Visit (HOSPITAL_COMMUNITY): Payer: Self-pay

## 2024-06-22 ENCOUNTER — Other Ambulatory Visit (HOSPITAL_COMMUNITY): Payer: Self-pay

## 2024-06-23 ENCOUNTER — Other Ambulatory Visit: Payer: Self-pay

## 2024-07-01 NOTE — Progress Notes (Deleted)
 "  Cardiology Office Note:    Date:  07/01/2024   ID:  Lisa Haley, DOB 04-07-1976, MRN [id number]  PCP:  Vicci Barnie NOVAK, MD  Cardiologist:  Oneil Parchment, MD { Click to update primary MD,subspecialty MD or APP then REFRESH:1}    Referring MD: Vicci Barnie NOVAK, MD   Chief Complaint: follow-up of hypertension and palpitations  History of Present Illness:    Lisa Haley is a 48 y.o. 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 Parchment and presents today for routine follow-up.  Patient was admitted in 01/2024 for NSTEMI after presenting with chest pain in setting of cocaine use. BP was markedly elevated on arrival and was as high as the 220s. High-sensitivity as high as 1,570. Echo showed LVEF of 50-55% with severe hypokinesis and subtle dyskinesis of the apex (but no evidence of LV thrombus). LHC showed sluggish flow in all coronaries but no evidence of CAD. 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 Medical therapy with DAPT with Aspirin  and Plavix  was recommended for one year. She was also noted to have some NSVT during admission with longest run around 30 seconds.   She was last seen by me in 02/2024 at which time she reported 2 episodes of chest pain with activity since discharge but she had not had any chest pain in the last 2 weeks. During one of these episodes, BP was markedly elevated. She also reported palpitations that she described as heart racing. Plan was to up-titrate antianginals if she has recurrent chest pain. A 2 week Zio monitor was ordered for further evaluation of palpitations and showed 3 short runs of NSVT (longest run 4 beats) and one 16 beat run of SVT/ atrial tachycardia as well  as rare PACs/ PVCs.   Patient presents today for follow-up. ***  History of Cocaine Induced NSTEMI Patient was admitted in 01/2024 for NSTEMI after presenting with chest pain in setting of cocaine use. Echo showed LVEF of 50-55% with severe hypokinesis and subtle dyskinesis of the apex (but no evidence of LV thrombus). LHC showed sluggish flow in all coronaries but no evidence of CAD. 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 Medical therapy was recommended.  - *** - Continue antianginals: Amlodipine  10mg  daily, Coreg  25mg  twice daily, and Imdur  60mg  twice daily. - Continue DAPT with Aspirin  and Plavix  for 1 year.  - Continue Lipitor  80mg  daily.   Palpitations Non-Sustained VT Patient was note to have a long run of non-sustained VT on telemetry during recent admission with longest run approximately 30 seconds. Outpatient monitor in 02/2024 showed 3 short runs of NSVT (longest run 4 beats) and one 16 beat run of SVT/ atrial tachycardia as well as rare PACs/ PVCs.  - *** - Continue Coreg  25mg  twice daily.    Hypertension BP **** - BP well controlled. - Continue current medications: Amlodipine  10mg  daily, Coreg  25mg  twice daily, Imdur  60mg  twice daily, and Irbesartan  150mg  daily.    Hyperlipidemia Lipid panel in 04/2024: Total Cholesterol 186, Triglycerides 76, HDL 53, LDL 119.  - Currently on Lipitor  80mg  daily. Compliance? ***   CKD Stage III Baseline creatinine around 1.1 to 1.3.    Polysubstance Abuse She continue to smoke cigarettes but is trying to quit. Down to <1/2 pack per  day. However, she denies any recurrent cocaine use since recent admission. *** - Congratulated patient on progress made so far. Encouraged her to continue to work towards complete cessation of tobacco and emphasized the importance of continuing to avoid cocaine.   EKGs/Labs/Other Studies Reviewed:    The following studies were reviewed:   Echocardiogram 01/26/2024: Impressions: 1. There is no evidence of left  ventricular apical thrombus. There is a  limited area of severe hypokinesis and subtle dyskinesis of the apex,  generally suggesting a pattern of apical ballooning. Left ventricular  ejection fraction, by estimation, is 50  to 55%. The left ventricle has low normal function. The left ventricle  demonstrates regional wall motion abnormalities (see scoring  diagram/findings for description). There is mild concentric left  ventricular hypertrophy. Left ventricular diastolic  parameters are consistent with Grade I diastolic dysfunction (impaired  relaxation). There is severe hypokinesis of the left ventricular, entire  apical segment.   2. Right ventricular systolic function is normal. The right ventricular  size is normal. Tricuspid regurgitation signal is inadequate for assessing  PA pressure.   3. The mitral valve is normal in structure. No evidence of mitral valve  regurgitation. No evidence of mitral stenosis.   4. The aortic valve is tricuspid. Aortic valve regurgitation is not  visualized. No aortic stenosis is present.   5. The inferior vena cava is normal in size with greater than 50%  respiratory variability, suggesting right atrial pressure of 3 mmHg.   Comparison(s): No prior Echocardiogram. Findings suggest possible  takotsubo syndrome, but cannot exclude distal LAD artery distribution  ischemia/infarction.  _______________   Cardiac Catheterization 01/28/2024: LM: Normal LAD: No significant disease Lcx: No significant disease RCA: No significant disease   LVEDP 15 mmHg   No significant epicardial coronary artery disease Sluggish flow in all coronaries Suspect Cocaine induced NSTEMI Recommend DAPT with Aspirin  and Plavix  for 1 year for medical management of NSTEMI _______________   Geoffry Monitor 03/03/2024 to 03/12/2024: Patient had a min HR of 59 bpm, max HR of 250 bpm, and avg HR of 93 bpm. Predominant underlying rhythm was Sinus Rhythm. 3 Ventricular Tachycardia runs  occurred, the run with the fastest interval lasting 4 beats with a max rate of 250 bpm, the longest lasting 6 beats with an avg rate of 153 bpm. 1 run of Supraventricular Tachycardia occurred lasting 16 beats with a max rate of 128 bpm (avg 115 bpm). Isolated SVEs were rare (<1.0%), SVE Couplets were rare (<1.0%), and SVE Triplets were rare (<1.0%). Isolated VEs were rare (<1.0%, 2206), VE Couplets were rare (<1.0%, 25), and VE Triplets were rare (<1.0%, 8). Ventricular Bigeminy was present.      Sinus rhythm with average heart rate 93 bpm   Brief atrial tachycardia   Rare PVCs and PACs.   No atrial fibrillation, no pauses  EKG:  EKG not ordered today.   Recent Labs: 01/26/2024: TSH 0.927 01/29/2024: Hemoglobin 9.9; Platelets 197 03/03/2024: Magnesium  2.2 04/15/2024: ALT 16; BUN 14; Creatinine, Ser 1.18; Potassium 5.1; Sodium 144  Recent Lipid Panel    Component Value Date/Time   CHOL 186 04/15/2024 1315   TRIG 76 04/15/2024 1315   HDL 53 04/15/2024 1315   CHOLHDL 3.5 04/15/2024 1315   CHOLHDL 3.8 01/27/2024 0242   VLDL 20 01/27/2024 0242   LDLCALC 119 (H) 04/15/2024 1315    Physical Exam:    Vital Signs: There were no vitals taken for this visit.    Wt Readings from Last  3 Encounters:  03/03/24 252 lb (114.3 kg)  01/26/24 239 lb (108.4 kg)  09/12/23 239 lb (108.4 kg)     General: 47 y.o. female in no acute distress. HEENT: Normocephalic and atraumatic. Sclera clear.  Neck: Supple. No carotid bruits. No JVD. Heart: *** RRR. Distinct S1 and S2. No murmurs, gallops, or rubs.  Lungs: No increased work of breathing. Clear to ausculation bilaterally. No wheezes, rhonchi, or rales.  Abdomen: Soft, non-distended, and non-tender to palpation.  Extremities: No lower extremity edema.  Radial and distal pedal pulses 2+ and equal bilaterally. Skin: Warm and dry. Neuro: No focal deficits. Psych: Normal affect. Responds appropriately.   Assessment:    No diagnosis found.  Plan:      Disposition: Follow up in ***   Signed, Aline FORBES Door, PA-C  07/01/2024 2:40 PM    [facility] "

## 2024-07-14 ENCOUNTER — Ambulatory Visit: Attending: Student | Admitting: Student

## 2024-07-18 ENCOUNTER — Ambulatory Visit: Admitting: Internal Medicine

## 2024-07-21 ENCOUNTER — Ambulatory Visit: Admitting: Surgical

## 2024-07-24 ENCOUNTER — Ambulatory Visit: Admitting: Internal Medicine

## 2024-07-28 ENCOUNTER — Other Ambulatory Visit: Payer: Self-pay | Admitting: Internal Medicine

## 2024-07-28 ENCOUNTER — Other Ambulatory Visit: Payer: Self-pay

## 2024-07-28 DIAGNOSIS — G8929 Other chronic pain: Secondary | ICD-10-CM

## 2024-07-28 DIAGNOSIS — G629 Polyneuropathy, unspecified: Secondary | ICD-10-CM

## 2024-07-28 DIAGNOSIS — M79641 Pain in right hand: Secondary | ICD-10-CM

## 2024-07-29 ENCOUNTER — Other Ambulatory Visit: Payer: Self-pay

## 2024-07-29 ENCOUNTER — Other Ambulatory Visit: Payer: Self-pay | Admitting: Internal Medicine

## 2024-07-29 DIAGNOSIS — M62838 Other muscle spasm: Secondary | ICD-10-CM

## 2024-07-29 DIAGNOSIS — R1030 Lower abdominal pain, unspecified: Secondary | ICD-10-CM

## 2024-07-29 DIAGNOSIS — I10 Essential (primary) hypertension: Secondary | ICD-10-CM

## 2024-07-29 DIAGNOSIS — M79641 Pain in right hand: Secondary | ICD-10-CM

## 2024-07-29 DIAGNOSIS — G8929 Other chronic pain: Secondary | ICD-10-CM

## 2024-07-29 DIAGNOSIS — R0602 Shortness of breath: Secondary | ICD-10-CM

## 2024-07-29 DIAGNOSIS — A048 Other specified bacterial intestinal infections: Secondary | ICD-10-CM

## 2024-07-29 DIAGNOSIS — G629 Polyneuropathy, unspecified: Secondary | ICD-10-CM

## 2024-07-29 MED ORDER — ASPIRIN 81 MG PO TBEC: 81.0000 mg | DELAYED_RELEASE_TABLET | Freq: Every day | ORAL | 0 refills | Status: AC

## 2024-07-29 NOTE — Telephone Encounter (Signed)
 Requested Prescriptions  Pending Prescriptions Disp Refills   aspirin  EC 81 MG tablet 30 tablet 0    Sig: Take 1 tablet (81 mg total) by mouth daily. Swallow whole.     Analgesics:  NSAIDS - aspirin  Failed - 07/29/2024  3:38 PM      Failed - Cr in normal range and within 360 days    Creatinine, Ser  Date Value Ref Range Status  04/15/2024 1.18 (H) 0.57 - 1.00 mg/dL Final         Passed - eGFR is 10 or above and within 360 days    GFR calc Af Amer  Date Value Ref Range Status  08/08/2016 >60 >60 mL/min Final    Comment:    (NOTE) The eGFR has been calculated using the CKD EPI equation. This calculation has not been validated in all clinical situations. eGFR's persistently <60 mL/min signify possible Chronic Kidney Disease.    GFR, Estimated  Date Value Ref Range Status  01/29/2024 59 (L) >60 mL/min Final    Comment:    (NOTE) Calculated using the CKD-EPI Creatinine Equation (2021)    eGFR  Date Value Ref Range Status  04/15/2024 57 (L) >59 mL/min/1.73 Final         Passed - Patient is not pregnant      Passed - Valid encounter within last 12 months    Recent Outpatient Visits           10 months ago Gas pain   [facility] [place]. [facility] [place], Jon HERO, [place]   1 year ago Neuropathy   [facility] [place]. [facility] [place], Jon HERO, [place]   1 year ago Patient left before evaluation by physician   [facility] [place]. [facility] Vicci Barnie NOVAK, [place]   1 year ago Essential hypertension   [facility] Of [place]. [facility] Vicci Barnie NOVAK, [place]   2 years ago Pre-operative clearance   [facility] - [facility] Dept Of [place]. [facility] Vicci Barnie NOVAK, MD       Future Appointments             In 1 month Goodrich, Callie E, PA-C [facility] [employer]  [facility] [employer]. [facility] [employer], [employer]

## 2024-07-29 NOTE — Telephone Encounter (Signed)
 Copied from CRM [id number]. Topic: Clinical - Medication Refill >> Jul 29, 2024  9:27 AM Victoria B wrote: Medication: aspirin  EC 81 MG tablet  Has the patient contacted their pharmacy? yes (Agent: If no, request that the patient contact the pharmacy for the refill. If patient does not wish to contact the pharmacy document the reason why and proceed with request.) (Agent: If yes, when and what did the pharmacy advise?)pharmacy called directly in  This is the patient's preferred pharmacy:    [facility], [place] - [address] [place] [address] Phone: [phone] Fax: [phone]  Is this the correct pharmacy for this prescription? yes   Has the prescription been filled recently? no  Is the patient out of the medication? Not sure  Has the patient been seen for an appointment in the last year OR does the patient have an upcoming appointment? yes  Can we respond through MyChart? n  Agent: Please be advised that Rx refills may take up to 3 business days. We ask that you follow-up with your pharmacy.

## 2024-07-30 ENCOUNTER — Other Ambulatory Visit: Payer: Self-pay | Admitting: Internal Medicine

## 2024-07-30 ENCOUNTER — Ambulatory Visit: Payer: Self-pay

## 2024-07-30 DIAGNOSIS — R1030 Lower abdominal pain, unspecified: Secondary | ICD-10-CM

## 2024-07-30 DIAGNOSIS — M62838 Other muscle spasm: Secondary | ICD-10-CM

## 2024-07-30 DIAGNOSIS — I10 Essential (primary) hypertension: Secondary | ICD-10-CM

## 2024-07-30 DIAGNOSIS — G8929 Other chronic pain: Secondary | ICD-10-CM

## 2024-07-30 DIAGNOSIS — G629 Polyneuropathy, unspecified: Secondary | ICD-10-CM

## 2024-07-30 DIAGNOSIS — M79641 Pain in right hand: Secondary | ICD-10-CM

## 2024-07-30 NOTE — Telephone Encounter (Signed)
" ° ° ° ° °  Reason for Triage: Patient states she had diarrhea since Sunday night and now has noticed stool is black. Reason for Disposition  Black or tarry bowel movements  (Exception: Chronic-unchanged black-grey BMs AND is taking iron pills or Pepto-Bismol.)  Answer Assessment - Initial Assessment Questions Diarrhea started Sunday. Monday color changed from light to dark, to light and then to black. States her BF also is having black stools. RN advised due to black stools, would recommend the ER. Pt asked RN to hold, calls were then merge, RN did say hello, they continued their conversation about food. Then Pt came back and asked if it was serious. RN advised it could indicate a bleed somewhere so would recommend ER and due to her symptoms would have someone else drive her. Pt states she will find a ride.  Due to ED dispo, not all triage questions answered.    1. DIARRHEA SEVERITY: How bad is the diarrhea? How many more stools have you had in the past 24 hours than normal?       2. ONSET: When did the diarrhea begin?      Sunday, monday 3. STOOL DESCRIPTION:  How loose or watery is the diarrhea? What is the stool color? Is there any blood or mucous in the stool?     black 4. VOMITING: Are you also vomiting? If Yes, ask: How many times in the past 24 hours?       5. ABDOMEN PAIN: Are you having any abdomen pain? If Yes, ask: What does it feel like? (e.g., crampy, dull, intermittent, constant)       6. ABDOMEN PAIN SEVERITY: If present, ask: How bad is the pain?  (e.g., Scale 1-10; mild, moderate, or severe)      7. ORAL INTAKE: If vomiting, Have you been able to drink liquids? How much liquids have you had in the past 24 hours?      8. HYDRATION: Any signs of dehydration? (e.g., dry mouth [not just dry lips], too weak to stand, dizziness, new weight loss) When did you last urinate?      9. EXPOSURE: Have you traveled to a foreign country recently? Have you  been exposed to anyone with diarrhea? Could you have eaten any food that was spoiled?      10. ANTIBIOTIC USE: Are you taking antibiotics now or have you taken antibiotics in the past 2 months?        11. OTHER SYMPTOMS: Do you have any other symptoms? (e.g., fever, blood in stool)        12 . PREGNANCY: Is there any chance you are pregnant? When was your last menstrual period?  Protocols used: Diarrhea-A-AH  "

## 2024-07-30 NOTE — Telephone Encounter (Signed)
 FYI Only or Action Required?: FYI only for provider: ED advised.  Patient was last seen in primary care on 09/12/2023 by Danton Jon HERO, PA-C.  Called Nurse Triage reporting Diarrhea.  Symptoms began several days ago.  Interventions attempted: Nothing.  Symptoms are: unchanged.  Triage Disposition: Go to ED Now (Notify PCP)  Patient/caregiver understands and will follow disposition?: Yes

## 2024-08-01 ENCOUNTER — Ambulatory Visit: Admitting: Internal Medicine

## 2024-08-05 ENCOUNTER — Other Ambulatory Visit: Payer: Self-pay | Admitting: Internal Medicine

## 2024-08-07 ENCOUNTER — Other Ambulatory Visit: Payer: Self-pay | Admitting: Internal Medicine

## 2024-08-07 DIAGNOSIS — R0602 Shortness of breath: Secondary | ICD-10-CM

## 2024-08-08 ENCOUNTER — Other Ambulatory Visit: Payer: Self-pay | Admitting: Internal Medicine

## 2024-08-08 DIAGNOSIS — A048 Other specified bacterial intestinal infections: Secondary | ICD-10-CM

## 2024-08-12 ENCOUNTER — Ambulatory Visit: Admitting: Internal Medicine

## 2024-08-21 ENCOUNTER — Ambulatory Visit: Payer: Self-pay | Admitting: Internal Medicine

## 2024-09-01 ENCOUNTER — Ambulatory Visit: Admitting: Student
